# Patient Record
Sex: Female | Born: 1935 | Race: Black or African American | Hispanic: No | Smoking: Never smoker
Health system: Southern US, Community
[De-identification: ages and names within clinical notes are randomized; demographics above are authoritative.]

## PROBLEM LIST (undated history)

## (undated) DIAGNOSIS — Z95810 Presence of automatic (implantable) cardiac defibrillator: Secondary | ICD-10-CM

## (undated) DIAGNOSIS — I10 Essential (primary) hypertension: Secondary | ICD-10-CM

## (undated) DIAGNOSIS — N179 Acute kidney failure, unspecified: Secondary | ICD-10-CM

## (undated) DIAGNOSIS — E785 Hyperlipidemia, unspecified: Secondary | ICD-10-CM

## (undated) DIAGNOSIS — D126 Benign neoplasm of colon, unspecified: Secondary | ICD-10-CM

## (undated) DIAGNOSIS — N183 Chronic kidney disease, stage 3 unspecified: Secondary | ICD-10-CM

## (undated) DIAGNOSIS — I2510 Atherosclerotic heart disease of native coronary artery without angina pectoris: Secondary | ICD-10-CM

## (undated) DIAGNOSIS — I428 Other cardiomyopathies: Secondary | ICD-10-CM

## (undated) DIAGNOSIS — I509 Heart failure, unspecified: Secondary | ICD-10-CM

## (undated) DIAGNOSIS — E119 Type 2 diabetes mellitus without complications: Secondary | ICD-10-CM

## (undated) DIAGNOSIS — J381 Polyp of vocal cord and larynx: Secondary | ICD-10-CM

## (undated) DIAGNOSIS — D649 Anemia, unspecified: Secondary | ICD-10-CM

## (undated) DIAGNOSIS — M8580 Other specified disorders of bone density and structure, unspecified site: Secondary | ICD-10-CM

## (undated) HISTORY — DX: Other cardiomyopathies: I42.8

## (undated) HISTORY — DX: Hyperlipidemia, unspecified: E78.5

## (undated) HISTORY — PX: COLONOSCOPY: SHX174

## (undated) HISTORY — DX: Other specified disorders of bone density and structure, unspecified site: M85.80

## (undated) HISTORY — DX: Heart failure, unspecified: I50.9

## (undated) HISTORY — PX: VAGINAL HYSTERECTOMY: SUR661

## (undated) HISTORY — DX: Benign neoplasm of colon, unspecified: D12.6

## (undated) HISTORY — DX: Essential (primary) hypertension: I10

## (undated) HISTORY — PX: CARDIAC CATHETERIZATION: SHX172

## (undated) HISTORY — DX: Polyp of vocal cord and larynx: J38.1

## (undated) HISTORY — DX: Atherosclerotic heart disease of native coronary artery without angina pectoris: I25.10

---

## 1978-11-14 HISTORY — PX: BREAST CYST EXCISION: SHX579

## 1998-04-14 ENCOUNTER — Encounter: Admission: RE | Admit: 1998-04-14 | Discharge: 1998-07-13 | Payer: Self-pay | Admitting: Internal Medicine

## 1999-06-04 ENCOUNTER — Ambulatory Visit (HOSPITAL_BASED_OUTPATIENT_CLINIC_OR_DEPARTMENT_OTHER): Admission: RE | Admit: 1999-06-04 | Discharge: 1999-06-04 | Payer: Self-pay | Admitting: Orthopedic Surgery

## 2002-05-21 ENCOUNTER — Encounter: Payer: Self-pay | Admitting: Cardiovascular Disease

## 2002-05-21 ENCOUNTER — Inpatient Hospital Stay (HOSPITAL_COMMUNITY): Admission: AD | Admit: 2002-05-21 | Discharge: 2002-05-26 | Payer: Self-pay | Admitting: Cardiovascular Disease

## 2002-05-21 ENCOUNTER — Encounter: Payer: Self-pay | Admitting: Surgery

## 2002-05-21 HISTORY — PX: CARDIAC DEFIBRILLATOR PLACEMENT: SHX171

## 2002-05-22 ENCOUNTER — Encounter: Payer: Self-pay | Admitting: Surgery

## 2002-05-23 ENCOUNTER — Encounter (INDEPENDENT_AMBULATORY_CARE_PROVIDER_SITE_OTHER): Payer: Self-pay | Admitting: *Deleted

## 2002-05-23 ENCOUNTER — Encounter: Payer: Self-pay | Admitting: Cardiovascular Disease

## 2002-05-24 ENCOUNTER — Encounter: Payer: Self-pay | Admitting: Cardiovascular Disease

## 2002-05-25 ENCOUNTER — Encounter: Payer: Self-pay | Admitting: Surgery

## 2002-06-19 ENCOUNTER — Ambulatory Visit (HOSPITAL_COMMUNITY): Admission: RE | Admit: 2002-06-19 | Discharge: 2002-06-19 | Payer: Self-pay | Admitting: Cardiovascular Disease

## 2002-06-19 ENCOUNTER — Encounter: Payer: Self-pay | Admitting: Cardiovascular Disease

## 2002-08-14 HISTORY — PX: MICROLARYNGOSCOPY WITH CO2 LASER AND EXCISION OF VOCAL CORD LESION: SHX5970

## 2002-09-04 ENCOUNTER — Ambulatory Visit (HOSPITAL_COMMUNITY): Admission: RE | Admit: 2002-09-04 | Discharge: 2002-09-05 | Payer: Self-pay | Admitting: Otolaryngology

## 2002-09-04 ENCOUNTER — Encounter (INDEPENDENT_AMBULATORY_CARE_PROVIDER_SITE_OTHER): Payer: Self-pay | Admitting: *Deleted

## 2002-11-14 DIAGNOSIS — D126 Benign neoplasm of colon, unspecified: Secondary | ICD-10-CM

## 2002-11-14 HISTORY — DX: Benign neoplasm of colon, unspecified: D12.6

## 2005-03-07 ENCOUNTER — Ambulatory Visit: Payer: Self-pay | Admitting: Internal Medicine

## 2005-06-13 ENCOUNTER — Ambulatory Visit (HOSPITAL_COMMUNITY): Admission: RE | Admit: 2005-06-13 | Discharge: 2005-06-13 | Payer: Self-pay | Admitting: Cardiovascular Disease

## 2005-07-12 ENCOUNTER — Ambulatory Visit: Payer: Self-pay | Admitting: Internal Medicine

## 2005-07-25 ENCOUNTER — Ambulatory Visit (HOSPITAL_COMMUNITY): Admission: RE | Admit: 2005-07-25 | Discharge: 2005-07-25 | Payer: Self-pay | Admitting: Cardiovascular Disease

## 2005-07-28 HISTORY — PX: CARDIAC CATHETERIZATION: SHX172

## 2005-08-22 ENCOUNTER — Ambulatory Visit: Payer: Self-pay | Admitting: Internal Medicine

## 2005-08-22 ENCOUNTER — Ambulatory Visit (HOSPITAL_COMMUNITY): Admission: RE | Admit: 2005-08-22 | Discharge: 2005-08-22 | Payer: Self-pay | Admitting: Internal Medicine

## 2005-10-27 ENCOUNTER — Ambulatory Visit: Payer: Self-pay | Admitting: Internal Medicine

## 2005-11-10 ENCOUNTER — Ambulatory Visit: Payer: Self-pay | Admitting: Family Medicine

## 2005-12-05 ENCOUNTER — Ambulatory Visit: Payer: Self-pay | Admitting: Internal Medicine

## 2006-02-10 ENCOUNTER — Ambulatory Visit: Payer: Self-pay | Admitting: Gastroenterology

## 2006-03-31 ENCOUNTER — Ambulatory Visit: Payer: Self-pay | Admitting: Internal Medicine

## 2006-04-19 ENCOUNTER — Ambulatory Visit (HOSPITAL_COMMUNITY): Admission: RE | Admit: 2006-04-19 | Discharge: 2006-04-19 | Payer: Self-pay | Admitting: Cardiovascular Disease

## 2006-06-21 ENCOUNTER — Ambulatory Visit: Payer: Self-pay | Admitting: Internal Medicine

## 2006-07-31 ENCOUNTER — Ambulatory Visit: Payer: Self-pay | Admitting: Gastroenterology

## 2006-08-10 ENCOUNTER — Ambulatory Visit: Payer: Self-pay | Admitting: Internal Medicine

## 2006-08-30 ENCOUNTER — Ambulatory Visit: Payer: Self-pay | Admitting: Internal Medicine

## 2006-09-01 ENCOUNTER — Encounter (INDEPENDENT_AMBULATORY_CARE_PROVIDER_SITE_OTHER): Payer: Self-pay | Admitting: *Deleted

## 2006-09-01 ENCOUNTER — Ambulatory Visit: Payer: Self-pay | Admitting: Gastroenterology

## 2006-09-01 LAB — HM COLONOSCOPY: HM Colonoscopy: ABNORMAL

## 2007-02-26 ENCOUNTER — Ambulatory Visit: Payer: Self-pay | Admitting: Internal Medicine

## 2007-04-05 ENCOUNTER — Ambulatory Visit: Payer: Self-pay | Admitting: Internal Medicine

## 2007-04-05 LAB — CONVERTED CEMR LAB
BUN: 16 mg/dL (ref 6–23)
CO2: 32 meq/L (ref 19–32)
Calcium: 9.6 mg/dL (ref 8.4–10.5)
Chloride: 106 meq/L (ref 96–112)
Cholesterol: 131 mg/dL (ref 0–200)
Creatinine, Ser: 0.9 mg/dL (ref 0.4–1.2)
GFR calc Af Amer: 79 mL/min
GFR calc non Af Amer: 66 mL/min
Glucose, Bld: 117 mg/dL — ABNORMAL HIGH (ref 70–99)
HDL: 39.5 mg/dL (ref >39.0)
Hgb A1c MFr Bld: 7.6 % — ABNORMAL HIGH (ref 4.6–6.0)
LDL Cholesterol: 75 mg/dL (ref 0–99)
Potassium: 4.5 meq/L (ref 3.5–5.1)
Sodium: 144 meq/L (ref 135–145)
Total CHOL/HDL Ratio: 3.3
Triglycerides: 85 mg/dL (ref 0–149)
VLDL: 17 mg/dL (ref 0–40)

## 2007-06-07 ENCOUNTER — Ambulatory Visit: Payer: Self-pay | Admitting: Internal Medicine

## 2007-06-07 DIAGNOSIS — Z860100 Personal history of colon polyps, unspecified: Secondary | ICD-10-CM | POA: Insufficient documentation

## 2007-06-07 DIAGNOSIS — E785 Hyperlipidemia, unspecified: Secondary | ICD-10-CM | POA: Insufficient documentation

## 2007-06-07 DIAGNOSIS — I5022 Chronic systolic (congestive) heart failure: Secondary | ICD-10-CM

## 2007-06-07 DIAGNOSIS — Z853 Personal history of malignant neoplasm of breast: Secondary | ICD-10-CM

## 2007-06-07 DIAGNOSIS — M810 Age-related osteoporosis without current pathological fracture: Secondary | ICD-10-CM | POA: Insufficient documentation

## 2007-06-07 DIAGNOSIS — I10 Essential (primary) hypertension: Secondary | ICD-10-CM

## 2007-06-07 DIAGNOSIS — E119 Type 2 diabetes mellitus without complications: Secondary | ICD-10-CM | POA: Insufficient documentation

## 2007-06-07 LAB — CONVERTED CEMR LAB
BUN: 17 mg/dL (ref 6–23)
CO2: 32 meq/L (ref 19–32)
Calcium: 9.7 mg/dL (ref 8.4–10.5)
Chloride: 106 meq/L (ref 96–112)
Cholesterol: 128 mg/dL (ref 0–200)
Creatinine, Ser: 1 mg/dL (ref 0.4–1.2)
GFR calc Af Amer: 70 mL/min
GFR calc non Af Amer: 58 mL/min
Glucose, Bld: 118 mg/dL — ABNORMAL HIGH (ref 70–99)
HDL: 38.3 mg/dL — ABNORMAL LOW (ref >39.0)
Hgb A1c MFr Bld: 6.6 % — ABNORMAL HIGH (ref 4.6–6.0)
LDL Cholesterol: 76 mg/dL (ref 0–99)
Potassium: 4.5 meq/L (ref 3.5–5.1)
Sodium: 143 meq/L (ref 135–145)
Total CHOL/HDL Ratio: 3.3
Triglycerides: 69 mg/dL (ref 0–149)
VLDL: 14 mg/dL (ref 0–40)

## 2007-09-26 ENCOUNTER — Ambulatory Visit: Payer: Self-pay | Admitting: Internal Medicine

## 2007-09-26 ENCOUNTER — Telehealth (INDEPENDENT_AMBULATORY_CARE_PROVIDER_SITE_OTHER): Payer: Self-pay | Admitting: *Deleted

## 2007-09-26 DIAGNOSIS — K5730 Diverticulosis of large intestine without perforation or abscess without bleeding: Secondary | ICD-10-CM | POA: Insufficient documentation

## 2007-09-26 DIAGNOSIS — J309 Allergic rhinitis, unspecified: Secondary | ICD-10-CM

## 2007-09-26 DIAGNOSIS — N390 Urinary tract infection, site not specified: Secondary | ICD-10-CM

## 2007-09-26 DIAGNOSIS — Z86718 Personal history of other venous thrombosis and embolism: Secondary | ICD-10-CM | POA: Insufficient documentation

## 2007-09-26 DIAGNOSIS — I2510 Atherosclerotic heart disease of native coronary artery without angina pectoris: Secondary | ICD-10-CM | POA: Insufficient documentation

## 2007-09-26 LAB — CONVERTED CEMR LAB
Bilirubin Urine: NEGATIVE
Glucose, Urine, Semiquant: NEGATIVE
Ketones, urine, test strip: NEGATIVE
Nitrite: POSITIVE
Protein, U semiquant: NEGATIVE
Specific Gravity, Urine: 1.01
Urobilinogen, UA: NEGATIVE
pH: 5.5

## 2007-09-27 ENCOUNTER — Encounter: Payer: Self-pay | Admitting: Internal Medicine

## 2007-09-28 ENCOUNTER — Encounter: Payer: Self-pay | Admitting: Internal Medicine

## 2007-10-02 ENCOUNTER — Encounter: Payer: Self-pay | Admitting: Internal Medicine

## 2007-11-12 ENCOUNTER — Encounter: Payer: Self-pay | Admitting: Internal Medicine

## 2007-12-06 ENCOUNTER — Ambulatory Visit: Payer: Self-pay | Admitting: Family Medicine

## 2007-12-06 ENCOUNTER — Encounter: Payer: Self-pay | Admitting: Internal Medicine

## 2008-01-24 ENCOUNTER — Encounter: Payer: Self-pay | Admitting: Internal Medicine

## 2008-04-21 ENCOUNTER — Ambulatory Visit: Payer: Self-pay | Admitting: Internal Medicine

## 2008-04-21 DIAGNOSIS — R5381 Other malaise: Secondary | ICD-10-CM

## 2008-04-21 DIAGNOSIS — R350 Frequency of micturition: Secondary | ICD-10-CM

## 2008-04-21 DIAGNOSIS — R5383 Other fatigue: Secondary | ICD-10-CM

## 2008-04-21 DIAGNOSIS — H6120 Impacted cerumen, unspecified ear: Secondary | ICD-10-CM

## 2008-04-22 LAB — CONVERTED CEMR LAB
ALT: 16 units/L (ref 0–35)
AST: 22 units/L (ref 0–37)
Albumin: 4.2 g/dL (ref 3.5–5.2)
Alkaline Phosphatase: 40 units/L (ref 39–117)
BUN: 26 mg/dL — ABNORMAL HIGH (ref 6–23)
Basophils Absolute: 0.2 10*3/uL — ABNORMAL HIGH (ref 0.0–0.1)
Basophils Relative: 2.5 % — ABNORMAL HIGH (ref 0.0–1.0)
Bilirubin Urine: NEGATIVE
Bilirubin, Direct: 0.1 mg/dL (ref 0.0–0.3)
CO2: 33 meq/L — ABNORMAL HIGH (ref 19–32)
Calcium: 10 mg/dL (ref 8.4–10.5)
Chloride: 103 meq/L (ref 96–112)
Cholesterol: 131 mg/dL (ref 0–200)
Creatinine, Ser: 1.1 mg/dL (ref 0.4–1.2)
Crystals: NEGATIVE
Eosinophils Absolute: 0.1 10*3/uL (ref 0.0–0.7)
Eosinophils Relative: 1.8 % (ref 0.0–5.0)
GFR calc Af Amer: 63 mL/min
GFR calc non Af Amer: 52 mL/min
Glucose, Bld: 101 mg/dL — ABNORMAL HIGH (ref 70–99)
HCT: 39.9 % (ref 36.0–46.0)
HDL: 37.4 mg/dL — ABNORMAL LOW (ref >39.0)
Hemoglobin: 13.5 g/dL (ref 12.0–15.0)
Hgb A1c MFr Bld: 6.7 % — ABNORMAL HIGH (ref 4.6–6.0)
Ketones, ur: NEGATIVE mg/dL
LDL Cholesterol: 78 mg/dL (ref 0–99)
Lymphocytes Relative: 16.1 % (ref 12.0–46.0)
MCHC: 33.7 g/dL (ref 30.0–36.0)
MCV: 96.5 fL (ref 78.0–100.0)
Monocytes Absolute: 0.5 10*3/uL (ref 0.1–1.0)
Monocytes Relative: 7.2 % (ref 3.0–12.0)
Mucus, UA: NEGATIVE
Neutro Abs: 5.5 10*3/uL (ref 1.4–7.7)
Neutrophils Relative %: 72.4 % (ref 43.0–77.0)
Nitrite: NEGATIVE
Platelets: 174 10*3/uL (ref 150–400)
Potassium: 4.4 meq/L (ref 3.5–5.1)
RBC: 4.14 M/uL (ref 3.87–5.11)
RDW: 12 % (ref 11.5–14.6)
Sodium: 144 meq/L (ref 135–145)
Specific Gravity, Urine: 1.025 (ref 1.000–1.03)
TSH: 1.14 microintl units/mL (ref 0.35–5.50)
Total Bilirubin: 0.6 mg/dL (ref 0.3–1.2)
Total CHOL/HDL Ratio: 3.5
Total Protein, Urine: >=300 mg/dL — AB
Total Protein: 7.4 g/dL (ref 6.0–8.3)
Triglycerides: 77 mg/dL (ref 0–149)
Urine Glucose: NEGATIVE mg/dL
Urobilinogen, UA: 0.2 (ref 0.0–1.0)
VLDL: 15 mg/dL (ref 0–40)
WBC: 7.5 10*3/uL (ref 4.5–10.5)
pH: 6 (ref 5.0–8.0)

## 2008-06-26 ENCOUNTER — Encounter: Payer: Self-pay | Admitting: Internal Medicine

## 2008-08-14 ENCOUNTER — Encounter: Payer: Self-pay | Admitting: Internal Medicine

## 2008-09-09 ENCOUNTER — Encounter: Payer: Self-pay | Admitting: Internal Medicine

## 2008-09-29 ENCOUNTER — Encounter: Payer: Self-pay | Admitting: Internal Medicine

## 2008-10-20 ENCOUNTER — Ambulatory Visit: Payer: Self-pay | Admitting: Internal Medicine

## 2008-10-20 ENCOUNTER — Encounter: Payer: Self-pay | Admitting: Internal Medicine

## 2008-10-20 LAB — CONVERTED CEMR LAB
BUN: 19 mg/dL (ref 6–23)
CO2: 32 meq/L (ref 19–32)
Calcium: 9.8 mg/dL (ref 8.4–10.5)
Chloride: 103 meq/L (ref 96–112)
Cholesterol: 115 mg/dL (ref 0–200)
Creatinine, Ser: 1 mg/dL (ref 0.4–1.2)
GFR calc Af Amer: 70 mL/min
GFR calc non Af Amer: 58 mL/min
Glucose, Bld: 112 mg/dL — ABNORMAL HIGH (ref 70–99)
HDL: 43 mg/dL (ref >39.0)
Hgb A1c MFr Bld: 7 % — ABNORMAL HIGH (ref 4.6–6.0)
LDL Cholesterol: 59 mg/dL (ref 0–99)
Potassium: 4.8 meq/L (ref 3.5–5.1)
Sodium: 141 meq/L (ref 135–145)
Total CHOL/HDL Ratio: 2.7
Triglycerides: 66 mg/dL (ref 0–149)
VLDL: 13 mg/dL (ref 0–40)

## 2008-12-16 ENCOUNTER — Telehealth: Payer: Self-pay | Admitting: Internal Medicine

## 2009-01-23 ENCOUNTER — Encounter: Payer: Self-pay | Admitting: Internal Medicine

## 2009-03-02 ENCOUNTER — Encounter: Payer: Self-pay | Admitting: Internal Medicine

## 2009-04-21 ENCOUNTER — Ambulatory Visit: Payer: Self-pay | Admitting: Internal Medicine

## 2009-06-02 ENCOUNTER — Ambulatory Visit: Payer: Self-pay | Admitting: Internal Medicine

## 2009-06-09 ENCOUNTER — Encounter: Payer: Self-pay | Admitting: Internal Medicine

## 2009-06-19 ENCOUNTER — Encounter: Payer: Self-pay | Admitting: Internal Medicine

## 2009-07-08 ENCOUNTER — Encounter: Payer: Self-pay | Admitting: Internal Medicine

## 2009-07-29 ENCOUNTER — Encounter: Payer: Self-pay | Admitting: Internal Medicine

## 2009-09-14 ENCOUNTER — Ambulatory Visit: Payer: Self-pay | Admitting: Internal Medicine

## 2009-09-16 LAB — CONVERTED CEMR LAB
BUN: 21 mg/dL (ref 6–23)
CO2: 33 meq/L — ABNORMAL HIGH (ref 19–32)
Calcium: 9.5 mg/dL (ref 8.4–10.5)
Chloride: 102 meq/L (ref 96–112)
Cholesterol: 123 mg/dL (ref 0–200)
Creatinine, Ser: 1.3 mg/dL — ABNORMAL HIGH (ref 0.4–1.2)
GFR calc non Af Amer: 51.55 mL/min (ref >60)
Glucose, Bld: 69 mg/dL — ABNORMAL LOW (ref 70–99)
HDL: 50.2 mg/dL (ref >39.00)
Hgb A1c MFr Bld: 6.2 % (ref 4.6–6.5)
LDL Cholesterol: 57 mg/dL (ref 0–99)
Potassium: 4.7 meq/L (ref 3.5–5.1)
Sodium: 141 meq/L (ref 135–145)
Total CHOL/HDL Ratio: 2
Triglycerides: 78 mg/dL (ref 0.0–149.0)
VLDL: 15.6 mg/dL (ref 0.0–40.0)

## 2009-09-30 ENCOUNTER — Encounter: Payer: Self-pay | Admitting: Internal Medicine

## 2009-10-20 ENCOUNTER — Encounter: Payer: Self-pay | Admitting: Internal Medicine

## 2009-12-18 ENCOUNTER — Ambulatory Visit: Payer: Self-pay | Admitting: Internal Medicine

## 2009-12-18 DIAGNOSIS — G4700 Insomnia, unspecified: Secondary | ICD-10-CM | POA: Insufficient documentation

## 2009-12-18 DIAGNOSIS — F329 Major depressive disorder, single episode, unspecified: Secondary | ICD-10-CM

## 2009-12-18 DIAGNOSIS — F411 Generalized anxiety disorder: Secondary | ICD-10-CM

## 2009-12-18 DIAGNOSIS — F3289 Other specified depressive episodes: Secondary | ICD-10-CM | POA: Insufficient documentation

## 2009-12-18 LAB — CONVERTED CEMR LAB: Hgb A1c MFr Bld: 6.8 % — ABNORMAL HIGH (ref 4.6–6.5)

## 2010-01-28 ENCOUNTER — Ambulatory Visit: Payer: Self-pay | Admitting: Family Medicine

## 2010-01-28 ENCOUNTER — Encounter: Payer: Self-pay | Admitting: Internal Medicine

## 2010-02-15 ENCOUNTER — Encounter: Payer: Self-pay | Admitting: Internal Medicine

## 2010-03-15 ENCOUNTER — Encounter: Payer: Self-pay | Admitting: Internal Medicine

## 2010-03-19 ENCOUNTER — Ambulatory Visit: Payer: Self-pay | Admitting: Internal Medicine

## 2010-03-19 DIAGNOSIS — M899 Disorder of bone, unspecified: Secondary | ICD-10-CM | POA: Insufficient documentation

## 2010-03-19 DIAGNOSIS — M949 Disorder of cartilage, unspecified: Secondary | ICD-10-CM

## 2010-03-19 LAB — CONVERTED CEMR LAB
BUN: 23 mg/dL (ref 6–23)
CO2: 35 meq/L — ABNORMAL HIGH (ref 19–32)
Calcium: 10.1 mg/dL (ref 8.4–10.5)
Chloride: 97 meq/L (ref 96–112)
Cholesterol: 125 mg/dL (ref 0–200)
Creatinine, Ser: 1.2 mg/dL (ref 0.4–1.2)
GFR calc non Af Amer: 59.3 mL/min (ref >60)
Glucose, Bld: 130 mg/dL — ABNORMAL HIGH (ref 70–99)
HDL: 47.7 mg/dL (ref >39.00)
Hgb A1c MFr Bld: 6.8 % — ABNORMAL HIGH (ref 4.6–6.5)
LDL Cholesterol: 64 mg/dL (ref 0–99)
Potassium: 4.7 meq/L (ref 3.5–5.1)
Sodium: 139 meq/L (ref 135–145)
Total CHOL/HDL Ratio: 3
Triglycerides: 69 mg/dL (ref 0.0–149.0)
VLDL: 13.8 mg/dL (ref 0.0–40.0)

## 2010-06-09 ENCOUNTER — Encounter: Payer: Self-pay | Admitting: Internal Medicine

## 2010-06-29 ENCOUNTER — Encounter: Payer: Self-pay | Admitting: Internal Medicine

## 2010-06-30 ENCOUNTER — Encounter: Payer: Self-pay | Admitting: Internal Medicine

## 2010-09-01 HISTORY — PX: IMPLANTABLE CARDIOVERTER DEFIBRILLATOR GENERATOR CHANGE: SHX5859

## 2010-09-02 ENCOUNTER — Encounter: Payer: Self-pay | Admitting: Internal Medicine

## 2010-09-07 ENCOUNTER — Encounter: Payer: Self-pay | Admitting: Internal Medicine

## 2010-09-20 ENCOUNTER — Ambulatory Visit: Payer: Self-pay | Admitting: Internal Medicine

## 2010-09-20 DIAGNOSIS — D649 Anemia, unspecified: Secondary | ICD-10-CM

## 2010-09-28 ENCOUNTER — Encounter: Payer: Self-pay | Admitting: Internal Medicine

## 2010-10-18 ENCOUNTER — Encounter: Payer: Self-pay | Admitting: Internal Medicine

## 2010-10-19 ENCOUNTER — Encounter: Payer: Self-pay | Admitting: Internal Medicine

## 2010-10-22 ENCOUNTER — Telehealth: Payer: Self-pay | Admitting: Internal Medicine

## 2010-11-22 ENCOUNTER — Ambulatory Visit
Admission: RE | Admit: 2010-11-22 | Discharge: 2010-11-22 | Payer: Self-pay | Source: Home / Self Care | Attending: Internal Medicine | Admitting: Internal Medicine

## 2010-11-22 DIAGNOSIS — J019 Acute sinusitis, unspecified: Secondary | ICD-10-CM | POA: Insufficient documentation

## 2010-12-16 NOTE — Letter (Signed)
Summary: [facility]   Imported By: Lennie Odor 09/15/2010 09:36:59  _____________________________________________________________________  External Attachment:    Type:   Image     Comment:   External Document

## 2010-12-16 NOTE — Letter (Signed)
Summary: [facility]   Imported By: Sherian Rein 11/01/2010 11:41:14  _____________________________________________________________________  External Attachment:    Type:   Image     Comment:   External Document

## 2010-12-16 NOTE — Medication Information (Signed)
Summary: [facility]   Imported By: Lester Milton 10/04/2010 09:14:50  _____________________________________________________________________  External Attachment:    Type:   Image     Comment:   External Document

## 2010-12-16 NOTE — Assessment & Plan Note (Signed)
Summary: SINUS/NWS   Vital Signs:  Patient profile:   77 year old female Height:      64.5 inches Weight:      113.25 pounds BMI:     19.21 O2 Sat:      98 % on Room air Temp:     97.9 degrees F oral Pulse rate:   80 / minute BP sitting:   100 / 66  (left arm) Cuff size:   regular  Vitals Entered By: Zella Ball Ewing CMA Duncan Dull) (November 22, 2010 11:47 AM)  O2 Flow:  Room air CC: Sinus Congestion, Refills/RE   CC:  Sinus Congestion and Refills/RE.  History of Present Illness: here with acute onset 3 days facial pain, pressure, fever and greenish d/c with right earache  as well but no hearing loss, headache, n/v or dizziness.  Pt denies CP, worsening sob, doe, wheezing, orthopnea, pnd, worsening LE edema, palps, dizziness or syncope  Pt denies new neuro symptoms such as headache, facial or extremity weakness  Pt denies polydipsia, polyuria, or low sugar symptoms such as shakiness improved with eating.   No recent wt loss, night sweats, loss of appetite or other constitutional symptoms   Due to f/u 1 wk from today with cardiologist in [place] regarding her aortic valve per pt.  Overall good compliance with meds, and good tolerability. Denies worsening depressive symptoms, suicidal ideation, or panic.    Preventive Screening-Counseling & Management      Drug Use:  no.    Problems Prior to Update: 1)  Sinusitis- Acute-nos  (ICD-461.9) 2)  Anemia-nos  (ICD-285.9) 3)  Osteopenia  (ICD-733.90) 4)  Preventive Health Care  (ICD-V70.0) 5)  Insomnia-sleep Disorder-unspec  (ICD-780.52) 6)  Depression  (ICD-311) 7)  Anxiety  (ICD-300.00) 8)  Cerumen Impaction, Bilateral  (ICD-380.4) 9)  Fatigue  (ICD-780.79) 10)  Urinary Frequency  (ICD-788.41) 11)  Allergic Rhinitis  (ICD-477.9) 12)  Coronary Artery Disease  (ICD-414.00) 13)  Diverticulosis, Colon  (ICD-562.10) 14)  Dvt, Hx of  (ICD-V12.51) 15)  Uti  (ICD-599.0) 16)  Family History Diabetes 1st Degree Relative  (ICD-V18.0) 17)  Breast  Cancer, Hx of  (ICD-V10.3) 18)  Osteoporosis  (ICD-733.00) 19)  Colonic Polyps, Hx of  (ICD-V12.72) 20)  Congestive Heart Failure  (ICD-428.0) 21)  Hypertension  (ICD-401.9) 22)  Hyperlipidemia  (ICD-272.4) 23)  Diabetes Mellitus, Type II  (ICD-250.00)  Medications Prior to Update: 1)  Boniva 150 Mg  Tabs (Ibandronate Sodium) .Marland Kitchen.. 1 By Mouth Q Month 2)  Januvia 100 Mg  Tabs (Sitagliptin Phosphate) .Marland Kitchen.. 1 By Mouth Once Daily 3)  Furosemide 40 Mg  Tabs (Furosemide) .... Take 1 By Mouth Every Other Day, Alternating With 1and 1/2 By Mouth Every Other Day 4)  Lisinopril 10 Mg  Tabs (Lisinopril) .... 1/2 By Mouth Once Daily 5)  Ecotrin Low Strength 81 Mg  Tbec (Aspirin) .Marland Kitchen.. 1po Qd 6)  Lanoxin 0.125 Mg  Tabs (Digoxin) .Marland Kitchen.. 1 By Mouth Qd 7)  Carvedilol 12.5 Mg  Tabs (Carvedilol) .Marland Kitchen.. 1 By Mouth Bid 8)  Simvastatin 20 Mg  Tabs (Simvastatin) .Marland Kitchen.. 1 By Mouth Qhs 9)  Spironolactone 25 Mg  Tabs (Spironolactone) .... 1/2  By Mouth Once Daily 10)  Diazepam 10 Mg  Tabs (Diazepam) .... 1/2 To 1 By Mouth At Bedtime Prn 11)  Metformin Hcl 500 Mg Tabs (Metformin Hcl) .Marland Kitchen.. 1 By Mouth Two Times A Day 12)  Klor-Con M20 20 Meq Cr-Tabs (Potassium Chloride Crys Cr) .Marland Kitchen.. 1po Once Daily 13)  Benefiber  Powd (Wheat Dextrin) .... 2 Tablespoons Once Daily 14)  Citracal/vitamin D 250-200 Mg-Unit Tabs (Calcium Citrate-Vitamin D) .Marland Kitchen.. 1 By Mouth Once Daily 15)  Vitamin C 500 Mg Tabs (Ascorbic Acid) .Marland Kitchen.. 1 By Mouth Once Daily 16)  Accu-Chek Aviva  Strp (Glucose Blood) .... Use Asd Two Times A Day  - Code 250.02 17)  Losartan Potassium 50 Mg Tabs (Losartan Potassium) .Marland Kitchen.. 1 By Mouth Once Daily 18)  Poly-Iron 150 150 Mg Caps (Polysaccharide Iron Complex) .Marland Kitchen.. 1 By Mouth Two Times A Day  Current Medications (verified): 1)  Boniva 150 Mg  Tabs (Ibandronate Sodium) .Marland Kitchen.. 1 By Mouth Q Month 2)  Januvia 100 Mg  Tabs (Sitagliptin Phosphate) .Marland Kitchen.. 1 By Mouth Once Daily 3)  Furosemide 40 Mg  Tabs (Furosemide) .... Take 1 By Mouth  Every Other Day, Alternating With 1and 1/2 By Mouth Every Other Day 4)  Lisinopril 10 Mg  Tabs (Lisinopril) .... 1/2 By Mouth Once Daily 5)  Ecotrin Low Strength 81 Mg  Tbec (Aspirin) .Marland Kitchen.. 1po Qd 6)  Lanoxin 0.125 Mg  Tabs (Digoxin) .Marland Kitchen.. 1 By Mouth Qd 7)  Carvedilol 12.5 Mg  Tabs (Carvedilol) .Marland Kitchen.. 1 By Mouth Bid 8)  Simvastatin 20 Mg  Tabs (Simvastatin) .Marland Kitchen.. 1 By Mouth Qhs 9)  Spironolactone 25 Mg  Tabs (Spironolactone) .... 1/2  By Mouth Once Daily 10)  Diazepam 10 Mg  Tabs (Diazepam) .... 1/2 To 1 By Mouth At Bedtime Prn 11)  Metformin Hcl 500 Mg Tabs (Metformin Hcl) .... 2 By Mouth Qam 12)  Klor-Con M20 20 Meq Cr-Tabs (Potassium Chloride Crys Cr) .Marland Kitchen.. 1po Once Daily 13)  Benefiber  Powd (Wheat Dextrin) .... 2 Tablespoons Once Daily 14)  Citracal/vitamin D 250-200 Mg-Unit Tabs (Calcium Citrate-Vitamin D) .Marland Kitchen.. 1 By Mouth Once Daily 15)  Vitamin C 500 Mg Tabs (Ascorbic Acid) .Marland Kitchen.. 1 By Mouth Once Daily 16)  Accu-Chek Aviva  Strp (Glucose Blood) .... Use Asd Two Times A Day  - Code 250.02 17)  Losartan Potassium 50 Mg Tabs (Losartan Potassium) .Marland Kitchen.. 1 By Mouth Once Daily 18)  Poly-Iron 150 150 Mg Caps (Polysaccharide Iron Complex) .Marland Kitchen.. 1 By Mouth Two Times A Day 19)  Cephalexin 500 Mg Caps (Cephalexin) .Marland Kitchen.. 1 By Mouth Three Times A Day  Allergies (verified): No Known Drug Allergies  Past History:  Past Medical History: Last updated: 09/20/2010 Diabetes mellitus, type II Hyperlipidemia Hypertension Congestive heart failure Non ischemic cardiomyopathyy - ER 10-15% Colonic polyps, hx of Osteoporosis Breast cancer, hx of DVT, hx of - LUE Diverticulosis, colon Coronary artery disease - non critical Allergic rhinitis Anxiety Depression - situational Osteopenia Anemia-NOS  Past Surgical History: Last updated: 09/20/2010 Pacemaker-05/21/2002; s/p re-do pacemaker/defibrillator oct 2011 - [facility] Hysterectomy L Breast tumor Resection- 1980s  Social History: Last updated:  09/20/2010 Never Smoked Alcohol use-no Married 2 sons lives with 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  , mild ill  Head:  normocephalic and atraumatic.   Eyes:  vision grossly intact, pupils equal, and pupils round.   Ears:  right tm mild erythema, left TM ok,  sinus tender bilat maxillary areas Nose:  nasal dischargemucosal pallor and mucosal edema.   Mouth:  pharyngeal erythema and fair dentition.  Neck:  supple and no masses.   Lungs:  normal respiratory effort and normal breath sounds.   Heart:  normal rate and regular rhythm.   Extremities:  no edema, no erythema    Impression & Recommendations:  Problem # 1:  SINUSITIS- ACUTE-NOS (ICD-461.9)  Her updated medication list for this problem includes:    Cephalexin 500 Mg Caps (Cephalexin) .Marland Kitchen... 1 by mouth three times a day treat as above, f/u any worsening signs or symptoms   Problem # 2:  DIABETES MELLITUS, TYPE II (ICD-250.00)  Her updated medication list for this problem includes:    Januvia 100 Mg Tabs (Sitagliptin phosphate) .Marland Kitchen... 1 by mouth once daily    Lisinopril 10 Mg Tabs (Lisinopril) .Marland Kitchen... 1/2 by mouth once daily    Ecotrin Low Strength 81 Mg Tbec (Aspirin) .Marland Kitchen... 1po qd    Metformin Hcl 500 Mg Tabs (Metformin hcl) .Marland Kitchen... 2 by mouth qam    Losartan Potassium 50 Mg Tabs (Losartan potassium) .Marland Kitchen... 1 by mouth once daily  Labs Reviewed: Creat: 1.2 (03/19/2010)    Reviewed HgBA1c results: 6.8 (03/19/2010)  6.8 (12/18/2009) stable overall by hx and exam, ok to continue meds/tx as is   Problem # 3:  HYPERTENSION (ICD-401.9)  Her updated medication list for this problem includes:    Furosemide 40 Mg Tabs (Furosemide) .Marland Kitchen... Take 1 by mouth every other day,  alternating with 1and 1/2 by mouth every other day    Lisinopril 10 Mg Tabs (Lisinopril) .Marland Kitchen... 1/2 by mouth once daily    Carvedilol 12.5 Mg Tabs (Carvedilol) .Marland Kitchen... 1 by mouth bid    Spironolactone 25 Mg Tabs (Spironolactone) .Marland Kitchen... 1/2  by mouth once daily    Losartan Potassium 50 Mg Tabs (Losartan potassium) .Marland Kitchen... 1 by mouth once daily  BP today: 100/66 Prior BP: 102/60 (09/20/2010)  Labs Reviewed: K+: 4.7 (03/19/2010) Creat: : 1.2 (03/19/2010)   Chol: 125 (03/19/2010)   HDL: 47.70 (03/19/2010)   LDL: 64 (03/19/2010)   TG: 69.0 (03/19/2010) stable overall by hx and exam, ok to continue meds/tx as is   Problem # 4:  DEPRESSION (ICD-311)  Her updated medication list for this problem includes:    Diazepam 10 Mg Tabs (Diazepam) .Marland Kitchen... 1/2 to 1 by mouth at bedtime prn stable overall by hx and exam, ok to continue meds/tx as is   Complete Medication List: 1)  Boniva 150 Mg Tabs (Ibandronate sodium) .Marland Kitchen.. 1 by mouth q month 2)  Januvia 100 Mg Tabs (Sitagliptin phosphate) .Marland Kitchen.. 1 by mouth once daily 3)  Furosemide 40 Mg Tabs (Furosemide) .... Take 1 by mouth every other day, alternating with 1and 1/2 by mouth every other day 4)  Lisinopril 10 Mg Tabs (Lisinopril) .... 1/2 by mouth once daily 5)  Ecotrin Low Strength 81 Mg Tbec (Aspirin) .Marland Kitchen.. 1po qd 6)  Lanoxin 0.125 Mg Tabs (Digoxin) .Marland Kitchen.. 1 by mouth qd 7)  Carvedilol 12.5 Mg Tabs (Carvedilol) .Marland Kitchen.. 1 by mouth bid 8)  Simvastatin 20 Mg Tabs (Simvastatin) .Marland Kitchen.. 1 by mouth qhs 9)  Spironolactone 25 Mg Tabs (Spironolactone) .... 1/2  by mouth once daily 10)  Diazepam 10 Mg Tabs (Diazepam) .... 1/2 to 1 by mouth at bedtime prn 11)  Metformin Hcl 500 Mg Tabs (Metformin hcl) .... 2 by mouth qam 12)  Klor-con M20 20 Meq Cr-tabs (Potassium chloride crys cr) .Marland Kitchen.. 1po once daily 13)  Benefiber Powd (Wheat dextrin) .... 2 tablespoons once daily 14)  Citracal/vitamin D 250-200 Mg-unit Tabs (Calcium citrate-vitamin d) .Marland KitchenMarland KitchenMarland Kitchen  1 by mouth once daily 15)  Vitamin  C 500 Mg Tabs (Ascorbic acid) .Marland Kitchen.. 1 by mouth once daily 16)  Accu-chek Aviva Strp (Glucose blood) .... Use asd two times a day  - code 250.02 17)  Losartan Potassium 50 Mg Tabs (Losartan potassium) .Marland Kitchen.. 1 by mouth once daily 18)  Poly-iron 150 150 Mg Caps (Polysaccharide iron complex) .Marland Kitchen.. 1 by mouth two times a day 19)  Cephalexin 500 Mg Caps (Cephalexin) .Marland Kitchen.. 1 by mouth three times a day  Patient Instructions: 1)  Please take all new medications as prescribed' 2)  Continue all previous medications as before this visit  3)  You are given some refills today - the boniva, januvia, and metformin 4)  You can also use Mucinex OTC or it's generic for congestion , as well as the tylenol as needed  5)  Please schedule a follow-up appointment in 4 months - in May 2012 as planned Prescriptions: CEPHALEXIN 500 MG CAPS (CEPHALEXIN) 1 by mouth three times a day  #30 x 0   Entered and Authorized by:   Corwin Levins MD   Signed by:   Corwin Levins MD on 11/22/2010   Method used:   Print then Give to Patient   RxID:   [id number] BONIVA 150 MG  TABS (IBANDRONATE SODIUM) 1 by mouth q month  #3 x 3   Entered and Authorized by:   Corwin Levins MD   Signed by:   Corwin Levins MD on 11/22/2010   Method used:   Print then Give to Patient   RxID:   [id number] JANUVIA 100 MG  TABS (SITAGLIPTIN PHOSPHATE) 1 by mouth once daily  #90 x 3   Entered and Authorized by:   Corwin Levins MD   Signed by:   Corwin Levins MD on 11/22/2010   Method used:   Print then Give to Patient   RxID:   [id number] JANUVIA 100 MG  TABS (SITAGLIPTIN PHOSPHATE) 1 by mouth once daily  #30 x 0   Entered and Authorized by:   Corwin Levins MD   Signed by:   Corwin Levins MD on 11/22/2010   Method used:   Print then Give to Patient   RxID:   [id number] METFORMIN HCL 500 MG TABS (METFORMIN HCL) 2 by mouth qam  #180 x 3   Entered and Authorized by:   Corwin Levins MD   Signed by:   Corwin Levins MD on 11/22/2010   Method  used:   Print then Give to Patient   RxID:   [id number] METFORMIN HCL 500 MG TABS (METFORMIN HCL) 2 by mouth qam  #60 x 0   Entered and Authorized by:   Corwin Levins MD   Signed by:   Corwin Levins MD on 11/22/2010   Method used:   Print then Give to Patient   RxID:   [id number]    Orders Added: 1)  Est. Patient Level IV [62376]

## 2010-12-16 NOTE — Assessment & Plan Note (Signed)
Summary: 3 MTH FU--STC   Vital Signs:  Patient profile:   77 year old female Height:      64.5 inches Weight:      108.75 pounds BMI:     18.45 O2 Sat:      97 % on Room air Temp:     97.7 degrees F oral Pulse rate:   73 / minute BP sitting:   102 / 62  (left arm) Cuff size:   regular  Vitals Entered ByZella Ball Ewing (Mar 19, 2010 10:16 AM)  O2 Flow:  Room air  Preventive Care Screening  Bone Density:    Date:  01/28/2010    Next Due:  02/2012    Results:  abnormal std dev  CC: 3 month followup/RE   CC:  3 month followup/RE.  History of Present Illness: husband died with Mult Myeloma in 04-Jan-2010;  Pt denies CP, sob, doe, wheezing, orthopnea, pnd, worsening LE edema, palps, dizziness or syncope   Pt denies new neuro symptoms such as headache, facial or extremity weakness   Pt denies polydipsia, polyuria, or low sugar symptoms such as shakiness improved with eating.  Overall good compliance with meds, trying to follow low chol, DM diet, wt stable, little excercise however   CBG's in low 100's.  Has had some increased stress, though mild and she never took the paxil, but wants to at this time.  No suicidal ideaiton or panic.    Problems Prior to Update: 1)  Osteopenia  (ICD-733.90) 2)  Preventive Health Care  (ICD-V70.0) 3)  Insomnia-sleep Disorder-unspec  (ICD-780.52) 4)  Depression  (ICD-311) 5)  Anxiety  (ICD-300.00) 6)  Cerumen Impaction, Bilateral  (ICD-380.4) 7)  Fatigue  (ICD-780.79) 8)  Urinary Frequency  (ICD-788.41) 9)  Allergic Rhinitis  (ICD-477.9) 10)  Coronary Artery Disease  (ICD-414.00) 11)  Diverticulosis, Colon  (ICD-562.10) 12)  Dvt, Hx of  (ICD-V12.51) 13)  Uti  (ICD-599.0) 14)  Family History Diabetes 1st Degree Relative  (ICD-V18.0) 15)  Breast Cancer, Hx of  (ICD-V10.3) 16)  Osteoporosis  (ICD-733.00) 17)  Colonic Polyps, Hx of  (ICD-V12.72) 18)  Congestive Heart Failure  (ICD-428.0) 19)  Hypertension  (ICD-401.9) 20)  Hyperlipidemia   (ICD-272.4) 21)  Diabetes Mellitus, Type II  (ICD-250.00)  Medications Prior to Update: 1)  Boniva 150 Mg  Tabs (Ibandronate Sodium) .Marland Kitchen.. 1 By Mouth Q Month 2)  Januvia 100 Mg  Tabs (Sitagliptin Phosphate) .Marland Kitchen.. 1 By Mouth Once Daily 3)  Furosemide 40 Mg  Tabs (Furosemide) .Marland Kitchen.. 1 and 1/2 By Mouth Qam 4)  Lisinopril 10 Mg  Tabs (Lisinopril) .... 1/2 By Mouth Once Daily 5)  Ecotrin Low Strength 81 Mg  Tbec (Aspirin) .Marland Kitchen.. 1po Qd 6)  Lanoxin 0.125 Mg  Tabs (Digoxin) .Marland Kitchen.. 1 By Mouth Qd 7)  Carvedilol 12.5 Mg  Tabs (Carvedilol) .Marland Kitchen.. 1 By Mouth Bid 8)  Simvastatin 20 Mg  Tabs (Simvastatin) .Marland Kitchen.. 1 By Mouth Qhs 9)  Spironolactone 25 Mg  Tabs (Spironolactone) .... 1/2  By Mouth Once Daily 10)  Diazepam 10 Mg  Tabs (Diazepam) .... 1/2 To 1 By Mouth At Bedtime Prn 11)  Metformin Hcl 500 Mg Tabs (Metformin Hcl) .Marland Kitchen.. 1 By Mouth Once Daily 12)  Klor-Con M20 20 Meq Cr-Tabs (Potassium Chloride Crys Cr) .Marland Kitchen.. 1po Once Daily 13)  Benefiber  Powd (Wheat Dextrin) .... 2 Tablespoons Once Daily 14)  Citracal/vitamin D 250-200 Mg-Unit Tabs (Calcium Citrate-Vitamin D) .Marland Kitchen.. 1 By Mouth Once Daily 15)  Vitamin C  500 Mg Tabs (Ascorbic Acid) .Marland Kitchen.. 1 By Mouth Once Daily 16)  Accu-Chek Aviva  Strp (Glucose Blood) .... Use Asd Two Times A Day  - Code 250.02 17)  Paroxetine Hcl 10 Mg Tabs (Paroxetine Hcl) .Marland Kitchen.. 1 By Mouth Once Daily 18)  Temazepam 15 Mg Caps (Temazepam) .Marland Kitchen.. 1 - 2 By Mouth At Bedtime As Needed  Current Medications (verified): 1)  Boniva 150 Mg  Tabs (Ibandronate Sodium) .Marland Kitchen.. 1 By Mouth Q Month 2)  Januvia 100 Mg  Tabs (Sitagliptin Phosphate) .Marland Kitchen.. 1 By Mouth Once Daily 3)  Furosemide 40 Mg  Tabs (Furosemide) .... Take 1 By Mouth Every Other Day, Alternating With 1and 1/2 By Mouth Every Other Day 4)  Lisinopril 10 Mg  Tabs (Lisinopril) .... 1/2 By Mouth Once Daily 5)  Ecotrin Low Strength 81 Mg  Tbec (Aspirin) .Marland Kitchen.. 1po Qd 6)  Lanoxin 0.125 Mg  Tabs (Digoxin) .Marland Kitchen.. 1 By Mouth Qd 7)  Carvedilol 12.5 Mg  Tabs  (Carvedilol) .Marland Kitchen.. 1 By Mouth Bid 8)  Simvastatin 20 Mg  Tabs (Simvastatin) .Marland Kitchen.. 1 By Mouth Qhs 9)  Spironolactone 25 Mg  Tabs (Spironolactone) .... 1/2  By Mouth Once Daily 10)  Diazepam 10 Mg  Tabs (Diazepam) .... 1/2 To 1 By Mouth At Bedtime Prn 11)  Metformin Hcl 500 Mg Tabs (Metformin Hcl) .Marland Kitchen.. 1 By Mouth Once Daily 12)  Klor-Con M20 20 Meq Cr-Tabs (Potassium Chloride Crys Cr) .Marland Kitchen.. 1po Once Daily 13)  Benefiber  Powd (Wheat Dextrin) .... 2 Tablespoons Once Daily 14)  Citracal/vitamin D 250-200 Mg-Unit Tabs (Calcium Citrate-Vitamin D) .Marland Kitchen.. 1 By Mouth Once Daily 15)  Vitamin C 500 Mg Tabs (Ascorbic Acid) .Marland Kitchen.. 1 By Mouth Once Daily 16)  Accu-Chek Aviva  Strp (Glucose Blood) .... Use Asd Two Times A Day  - Code 250.02 17)  Paroxetine Hcl 10 Mg Tabs (Paroxetine Hcl) .Marland Kitchen.. 1po Once Daily  Allergies (verified): No Known Drug Allergies  Past History:  Past Surgical History: Last updated: 09/26/2007 Pacemaker-05/21/2002 Hysterectomy L Breast tumor Resection- 1980s  Social History: Last updated: 09/14/2009 Never Smoked Alcohol use-no Married 2 sons lives with husband retired - Counsellor - report clerk Husband now with Multiple Myeloma - sees dr neijstrom/[place]  Risk Factors: Smoking Status: never (09/26/2007)  Past Medical History: Diabetes mellitus, type II Hyperlipidemia Hypertension Congestive heart failure Colonic polyps, hx of Osteoporosis Breast cancer, hx of DVT, hx of - LUE Diverticulosis, colon Coronary artery disease - non critical Allergic rhinitis Anxiety Depression - situational Osteopenia  Review of Systems       all otherwise negative per pt -    Physical Exam  General:  alert and well-developed.   Head:  normocephalic and atraumatic.   Eyes:  vision grossly intact, pupils equal, and pupils round.   Ears:  R ear normal and L ear normal.   Nose:  no external deformity and no nasal discharge.   Mouth:  no gingival abnormalities and pharynx  pink and moist.   Neck:  supple and no masses.   Lungs:  normal respiratory effort and normal breath sounds.   Heart:  normal rate and regular rhythm.   Extremities:  no edema, no erythema  Psych:  dysphoric affect and moderately anxious.     Impression & Recommendations:  Problem # 1:  DIABETES MELLITUS, TYPE II (ICD-250.00)  Her updated medication list for this problem includes:    Januvia 100 Mg Tabs (Sitagliptin phosphate) .Marland Kitchen... 1 by mouth once daily    Lisinopril  10 Mg Tabs (Lisinopril) .Marland Kitchen... 1/2 by mouth once daily    Ecotrin Low Strength 81 Mg Tbec (Aspirin) .Marland Kitchen... 1po qd    Metformin Hcl 500 Mg Tabs (Metformin hcl) .Marland Kitchen... 1 by mouth once daily  Labs Reviewed: Creat: 1.3 (09/14/2009)    Reviewed HgBA1c results: 6.8 (12/18/2009)  6.2 (09/14/2009) stable overall by hx and exam, ok to continue meds/tx as is ;  Pt to cont DM diet, excercise, wt loss efforts; to check labs today   Orders: TLB-BMP (Basic Metabolic Panel-BMET) (80048-METABOL) TLB-A1C / Hgb A1C (Glycohemoglobin) (83036-A1C) TLB-Lipid Panel (80061-LIPID)  Problem # 2:  HYPERLIPIDEMIA (ICD-272.4)  Her updated medication list for this problem includes:    Simvastatin 20 Mg Tabs (Simvastatin) .Marland Kitchen... 1 by mouth qhs  Labs Reviewed: SGOT: 22 (04/21/2008)   SGPT: 16 (04/21/2008)   HDL:50.20 (09/14/2009), 43.0 (10/20/2008)  LDL:57 (09/14/2009), 59 (10/20/2008)  Chol:123 (09/14/2009), 115 (10/20/2008)  Trig:78.0 (09/14/2009), 66 (10/20/2008) stable overall by hx and exam, ok to continue meds/tx as is  , Pt to continue diet efforts, good med tolerance; to check labs - goal LDL less than 70   Problem # 3:  HYPERTENSION (ICD-401.9)  Her updated medication list for this problem includes:    Furosemide 40 Mg Tabs (Furosemide) .Marland Kitchen... Take 1 by mouth every other day, alternating with 1and 1/2 by mouth every other day    Lisinopril 10 Mg Tabs (Lisinopril) .Marland Kitchen... 1/2 by mouth once daily    Carvedilol 12.5 Mg Tabs (Carvedilol)  .Marland Kitchen... 1 by mouth bid    Spironolactone 25 Mg Tabs (Spironolactone) .Marland Kitchen... 1/2  by mouth once daily  BP today: 102/62 Prior BP: 110/68 (12/18/2009)  Labs Reviewed: K+: 4.7 (09/14/2009) Creat: : 1.3 (09/14/2009)   Chol: 123 (09/14/2009)   HDL: 50.20 (09/14/2009)   LDL: 57 (09/14/2009)   TG: 78.0 (09/14/2009) stable overall by hx and exam, ok to continue meds/tx as is   Problem # 4:  DEPRESSION (ICD-311)  The following medications were removed from the medication list:    Paroxetine Hcl 10 Mg Tabs (Paroxetine hcl) .Marland Kitchen... 1 by mouth once daily Her updated medication list for this problem includes:    Diazepam 10 Mg Tabs (Diazepam) .Marland Kitchen... 1/2 to 1 by mouth at bedtime prn    Paroxetine Hcl 10 Mg Tabs (Paroxetine hcl) .Marland Kitchen... 1po once daily denies worsening  symptoms, stable overall by hx and exam, ok to continue meds/tx as is , wt improved now; but is interested in takng the paxil now to help wtih mood and wt gain  Complete Medication List: 1)  Boniva 150 Mg Tabs (Ibandronate sodium) .Marland Kitchen.. 1 by mouth q month 2)  Januvia 100 Mg Tabs (Sitagliptin phosphate) .Marland Kitchen.. 1 by mouth once daily 3)  Furosemide 40 Mg Tabs (Furosemide) .... Take 1 by mouth every other day, alternating with 1and 1/2 by mouth every other day 4)  Lisinopril 10 Mg Tabs (Lisinopril) .... 1/2 by mouth once daily 5)  Ecotrin Low Strength 81 Mg Tbec (Aspirin) .Marland Kitchen.. 1po qd 6)  Lanoxin 0.125 Mg Tabs (Digoxin) .Marland Kitchen.. 1 by mouth qd 7)  Carvedilol 12.5 Mg Tabs (Carvedilol) .Marland Kitchen.. 1 by mouth bid 8)  Simvastatin 20 Mg Tabs (Simvastatin) .Marland Kitchen.. 1 by mouth qhs 9)  Spironolactone 25 Mg Tabs (Spironolactone) .... 1/2  by mouth once daily 10)  Diazepam 10 Mg Tabs (Diazepam) .... 1/2 to 1 by mouth at bedtime prn 11)  Metformin Hcl 500 Mg Tabs (Metformin hcl) .Marland Kitchen.. 1 by mouth once daily 12)  Klor-con  M20 20 Meq Cr-tabs (Potassium chloride crys cr) .Marland Kitchen.. 1po once daily 13)  Benefiber Powd (Wheat dextrin) .... 2 tablespoons once daily 14)  Citracal/vitamin D  250-200 Mg-unit Tabs (Calcium citrate-vitamin d) .Marland Kitchen.. 1 by mouth once daily 15)  Vitamin C 500 Mg Tabs (Ascorbic acid) .Marland Kitchen.. 1 by mouth once daily 16)  Accu-chek Aviva Strp (Glucose blood) .... Use asd two times a day  - code 250.02 17)  Paroxetine Hcl 10 Mg Tabs (Paroxetine hcl) .Marland Kitchen.. 1po once daily  Patient Instructions: 1)  Please take all new medications as prescribed 2)  Continue all previous medications as before this visit  3)  Please schedule a follow-up appointment in 6 months. Prescriptions: PAROXETINE HCL 10 MG TABS (PAROXETINE HCL) 1po once daily  #90 x 3   Entered and Authorized by:   Corwin Levins MD   Signed by:   Corwin Levins MD on 03/19/2010   Method used:   Print then Give to Patient   RxID:   [id number]

## 2010-12-16 NOTE — Letter (Signed)
Summary: Addendum for 02-15-10 Visit/[facility]  Addendum for 02-15-10 Visit/[facility]   Imported By: Sherian Rein 02/19/2010 09:30:52  _____________________________________________________________________  External Attachment:    Type:   Image     Comment:   External Document

## 2010-12-16 NOTE — Letter (Signed)
Summary: OV note & addendem/[facility]  OV note & addendem/[facility]   Imported By: Lester [place] 07/08/2010 09:50:49  _____________________________________________________________________  External Attachment:    Type:   Image     Comment:   External Document

## 2010-12-16 NOTE — Progress Notes (Signed)
Summary: elevated A1c  Phone Note Call from Patient Call back at Home Phone [phone]   Caller: Patient Summary of Call: Pt called stating she saw her Cardiologist yesterday and was advised via Labs that her A1C was elevated at 7.2. Cardiology will send a copy of labs for JWJ to review but they advised pt to call MD. Pt says she was taking Metformin 500 2 tabs once daily but was reduced to 1 tab once daily. Pt is requesting to go back to 2 tabs per advisement of Cardiologist, please advise. Initial call taken by: Margaret Pyle, CMA,  October 22, 2010 8:44 AM  Follow-up for Phone Call        ok to go back to 2 Follow-up by: Corwin Levins MD,  October 22, 2010 1:21 PM  Additional Follow-up for Phone Call Additional follow up Details #1::        Pt advised and agreed Additional Follow-up by: Margaret Pyle, CMA,  October 22, 2010 1:23 PM    New/Updated Medications: METFORMIN HCL 500 MG TABS (METFORMIN HCL) 1 by mouth two times a day

## 2010-12-16 NOTE — Letter (Signed)
Summary: [facility]  [facility]   Imported By: Sherian Rein 09/17/2010 10:33:24  _____________________________________________________________________  External Attachment:    Type:   Image     Comment:   External Document

## 2010-12-16 NOTE — Progress Notes (Signed)
Summary: Accu-Chek readings  Accu-Chek readings   Imported By: Lester [place] 12/21/2009 10:06:46  _____________________________________________________________________  External Attachment:    Type:   Image     Comment:   External Document

## 2010-12-16 NOTE — Letter (Signed)
Summary: [facility]  [facility]   Imported By: Lester [place] 02/18/2010 10:43:35  _____________________________________________________________________  External Attachment:    Type:   Image     Comment:   External Document

## 2010-12-16 NOTE — Letter (Signed)
Summary: Diabetic Supplies/[facility]  Diabetic Supplies/[facility]   Imported By: Sherian Rein 03/17/2010 10:21:01  _____________________________________________________________________  External Attachment:    Type:   Image     Comment:   External Document

## 2010-12-16 NOTE — Letter (Signed)
Summary: [facility]  [facility]   Imported By: Sherian Rein 09/10/2010 14:44:12  _____________________________________________________________________  External Attachment:    Type:   Image     Comment:   External Document

## 2010-12-16 NOTE — Miscellaneous (Signed)
Summary: BONE DENSITY  Clinical Lists Changes  Orders: Added new Test order of T-Bone Densitometry (77080) - Signed Added new Test order of T-Lumbar Vertebral Assessment (77082) - Signed 

## 2010-12-16 NOTE — Assessment & Plan Note (Signed)
Summary: 3 MO ROV / NWS   Vital Signs:  Patient profile:   77 year old female Height:      65 inches Weight:      108 pounds BMI:     18.04 O2 Sat:      97 % on Room air Temp:     97.6 degrees F oral Pulse rate:   69 / minute BP sitting:   110 / 68  (left arm) Cuff size:   regular  Vitals Entered ByZella Ball Ewing (December 18, 2009 9:48 AM)  O2 Flow:  Room air CC: 3 Mo ROV/RE   CC:  3 Mo ROV/RE.  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   She requests a1c today and is very fastidious over her health.  Pt denies CP, sob, doe, wheezing, orthopnea, pnd, worsening LE edema, palps, dizziness or syncope  Pt denies new neuro symptoms such as headache, facial or extremity weakness   Pt denies polydipsia, polyuria, or low sugar symptoms such as shakiness improved with eating.  Overall good compliance with meds, trying to follow low chol, DM diet, wt as able.  Has had a few sugars on the lower side at 88 though no symptoms.   Here for wellness Diet: Heart Healthy or DM if diabetic Physical Activities: Sedentary Depression/mood screen: Positive as above - no suicidal ideation or panic Hearing: Intact bilateral Visual Acuity: Grossly normal ADL's: Capable  Fall Risk: None Home Safety: Good End-of-Life Planning: Advance directive - Full code/I agree   Problems Prior to Update: 1)  Insomnia-sleep Disorder-unspec  (ICD-780.52) 2)  Depression  (ICD-311) 3)  Anxiety  (ICD-300.00) 4)  Cerumen  Impaction, Bilateral  (ICD-380.4) 5)  Fatigue  (ICD-780.79) 6)  Urinary Frequency  (ICD-788.41) 7)  Allergic Rhinitis  (ICD-477.9) 8)  Coronary Artery Disease  (ICD-414.00) 9)  Diverticulosis, Colon  (ICD-562.10) 10)  Dvt, Hx of  (ICD-V12.51) 11)  Uti  (ICD-599.0) 12)  Family History Diabetes 1st Degree Relative  (ICD-V18.0) 13)  Breast Cancer, Hx of  (ICD-V10.3) 14)  Osteoporosis  (ICD-733.00) 15)  Colonic Polyps, Hx of  (ICD-V12.72) 16)  Congestive Heart Failure  (ICD-428.0) 17)  Hypertension  (ICD-401.9) 18)  Hyperlipidemia  (ICD-272.4) 19)  Diabetes Mellitus, Type II  (ICD-250.00)  Medications Prior to Update: 1)  Boniva 150 Mg  Tabs (Ibandronate Sodium) .Marland Kitchen.. 1 By Mouth Q Month 2)  Januvia 100 Mg  Tabs (Sitagliptin Phosphate) .Marland Kitchen.. 1 By Mouth Once Daily 3)  Furosemide 40 Mg  Tabs (Furosemide) .Marland Kitchen.. 1 and 1/2 By Mouth Qam 4)  Lisinopril 10 Mg  Tabs (Lisinopril) .... 1/2 By Mouth Once Daily 5)  Ecotrin Low Strength 81 Mg  Tbec (Aspirin) .Marland Kitchen.. 1po Qd 6)  Lanoxin 0.125 Mg  Tabs (Digoxin) .Marland Kitchen.. 1 By Mouth Qd 7)  Carvedilol 12.5 Mg  Tabs (Carvedilol) .Marland Kitchen.. 1 By Mouth Bid 8)  Simvastatin 20 Mg  Tabs (Simvastatin) .Marland Kitchen.. 1 By Mouth Qhs  9)  Spironolactone 25 Mg  Tabs (Spironolactone) .... 1/2  By Mouth Once Daily 10)  Diazepam 10 Mg  Tabs (Diazepam) .... 1/2 To 1 By Mouth At Bedtime Prn 11)  Metformin Hcl 500 Mg Tabs (Metformin Hcl) .... 2 By Mouth Once Daily 12)  Klor-Con M20 20 Meq Cr-Tabs (Potassium Chloride Crys Cr) .Marland Kitchen.. 1po Once Daily 13)  Benefiber  Powd (Wheat Dextrin) .... 2 Tablespoons Once Daily 14)  Citracal/vitamin D 250-200 Mg-Unit Tabs (Calcium Citrate-Vitamin D) .Marland Kitchen.. 1 By Mouth Once Daily 15)  Vitamin C 500 Mg Tabs (Ascorbic Acid) .Marland Kitchen.. 1 By Mouth Once Daily 16)  Accu-Chek Aviva  Strp (Glucose Blood) .... Use Asd Two Times A Day  - Code 250.02  Current Medications (verified): 1)  Boniva 150 Mg  Tabs (Ibandronate Sodium) .Marland Kitchen.. 1 By Mouth Q Month 2)  Januvia 100 Mg  Tabs  (Sitagliptin Phosphate) .Marland Kitchen.. 1 By Mouth Once Daily 3)  Furosemide 40 Mg  Tabs (Furosemide) .Marland Kitchen.. 1 and 1/2 By Mouth Qam 4)  Lisinopril 10 Mg  Tabs (Lisinopril) .... 1/2 By Mouth Once Daily 5)  Ecotrin Low Strength 81 Mg  Tbec (Aspirin) .Marland Kitchen.. 1po Qd 6)  Lanoxin 0.125 Mg  Tabs (Digoxin) .Marland Kitchen.. 1 By Mouth Qd 7)  Carvedilol 12.5 Mg  Tabs (Carvedilol) .Marland Kitchen.. 1 By Mouth Bid 8)  Simvastatin 20 Mg  Tabs (Simvastatin) .Marland Kitchen.. 1 By Mouth Qhs 9)  Spironolactone 25 Mg  Tabs (Spironolactone) .... 1/2  By Mouth Once Daily 10)  Diazepam 10 Mg  Tabs (Diazepam) .... 1/2 To 1 By Mouth At Bedtime Prn 11)  Metformin Hcl 500 Mg Tabs (Metformin Hcl) .Marland Kitchen.. 1 By Mouth Once Daily 12)  Klor-Con M20 20 Meq Cr-Tabs (Potassium Chloride Crys Cr) .Marland Kitchen.. 1po Once Daily 13)  Benefiber  Powd (Wheat Dextrin) .... 2 Tablespoons Once Daily 14)  Citracal/vitamin D 250-200 Mg-Unit Tabs (Calcium Citrate-Vitamin D) .Marland Kitchen.. 1 By Mouth Once Daily 15)  Vitamin C 500 Mg Tabs (Ascorbic Acid) .Marland Kitchen.. 1 By Mouth Once Daily 16)  Accu-Chek Aviva  Strp (Glucose Blood) .... Use Asd Two Times A Day  - Code 250.02 17)  Paroxetine Hcl 10 Mg Tabs (Paroxetine Hcl) .Marland Kitchen.. 1 By Mouth Once Daily 18)  Temazepam 15 Mg Caps (Temazepam) .Marland Kitchen.. 1 - 2 By Mouth At Bedtime As Needed  Allergies (verified): No Known Drug Allergies  Past History:  Past Surgical History: Last updated: 09/26/2007 Pacemaker-05/21/2002 Hysterectomy L Breast tumor Resection- 1980s  Family History: Last updated: 04/21/2009 Family History Diabetes 1st degree relative Family History of Arthritis- mother and father brother with CABG brother with prostate problem with up and down PSA  Social History: Last updated: 09/14/2009 Never Smoked Alcohol use-no Married 2 sons lives with husband retired - Counsellor - report clerk Husband now with Multiple Myeloma - sees dr neijstrom/M[facility]illan  Risk Factors: Smoking Status: never (09/26/2007)  Past Medical History: Diabetes mellitus, type  II Hyperlipidemia Hypertension Congestive heart failure Colonic polyps, hx of Osteoporosis Breast cancer, hx of DVT, hx of - LUE Diverticulosis, colon Coronary artery disease - non critical Allergic rhinitis Anxiety Depression - situational  Review of Systems  The patient denies anorexia, fever, weight gain, vision loss, decreased hearing, hoarseness, chest pain, syncope, dyspnea on exertion, peripheral edema, prolonged cough, headaches, hemoptysis, abdominal pain, melena, hematochezia, severe indigestion/heartburn, hematuria, incontinence, genital sores, muscle weakness, suspicious skin lesions, transient blindness, difficulty walking, abnormal bleeding, enlarged lymph nodes, and angioedema.         all otherwise negative per pt -  except severe problem with insomnia since husband has been in NH  Physical Exam  General:  alert and underweight appearing.   Head:  normocephalic and atraumatic.   Eyes:  vision grossly intact, pupils equal, and pupils round.   Ears:  R ear normal and L ear normal.   Nose:  no external deformity and no nasal discharge.   Mouth:  no gingival abnormalities and pharynx pink and moist.   Neck:  supple and no masses.   Lungs:  normal respiratory effort and normal breath sounds.   Heart:  normal rate and regular rhythm.   Abdomen:  soft, non-tender, and normal bowel sounds.   Msk:  no joint tenderness and no joint swelling.   Extremities:  no edema, no erythema  Neurologic:  cranial nerves II-XII intact and strength normal in all extremities.   Psych:  depressed affect, tearful, and moderately anxious.     Impression & Recommendations:  Problem # 1:  Preventive Health Care (ICD-V70.0)  Overall doing well, age appropriate education and counseling updated and referral for appropriate preventive services done unless declined, immunizations up to date or declined, diet counseling done if overweight, urged to quit smoking if smokes , most recent labs  reviewed and current ordered if appropriate, ecg reviewed or declined (interpretation per ECG scanned in the EMR if done); information regarding Medicare Prevention requirements given if appropriate   Orders: First annual wellness visit with prevention plan  ([id number])  Problem # 2:  DEPRESSION (ICD-311)  Her updated medication list for this problem includes:    Diazepam 10 Mg Tabs (Diazepam) .Marland Kitchen... 1/2 to 1 by mouth at bedtime prn    Paroxetine Hcl 10 Mg Tabs (Paroxetine hcl) .Marland Kitchen... 1 by mouth once daily situational, to add the paxil 10 mg per day, consider higher dose if needed after 3 to 4 wks, may helop with regaining some wt as well  Problem # 3:  INSOMNIA-SLEEP DISORDER-UNSPEC (ICD-780.52)  ok for  temazepam 15 at bedtime as needed   Her updated medication list for this problem includes:    Temazepam 15 Mg Caps (Temazepam) .Marland Kitchen... 1 - 2 by mouth at bedtime as needed  Problem # 4:  DIABETES MELLITUS, TYPE II (ICD-250.00)  Her updated medication list for this problem includes:    Januvia 100 Mg Tabs (Sitagliptin phosphate) .Marland Kitchen... 1 by mouth once daily    Lisinopril 10 Mg Tabs (Lisinopril) .Marland Kitchen... 1/2 by mouth once daily    Ecotrin Low Strength 81 Mg Tbec (Aspirin) .Marland Kitchen... 1po qd    Metformin Hcl 500 Mg Tabs (Metformin hcl) .Marland Kitchen... 1 by mouth once daily asympt but overcontrolled with recent wt loss - to reduce the metformin to 1 once daily   Orders: TLB-A1C / Hgb A1C (Glycohemoglobin) (83036-A1C)  Problem # 5:  HYPERLIPIDEMIA (ICD-272.4)  Her updated medication list for this problem includes:    Simvastatin 20 Mg Tabs (Simvastatin) .Marland Kitchen... 1 by mouth qhs  Labs Reviewed: SGOT: 22 (04/21/2008)   SGPT: 16 (04/21/2008)   HDL:50.20 (09/14/2009), 43.0 (10/20/2008)  LDL:57 (09/14/2009), 59 (10/20/2008)  Chol:123 (09/14/2009), 115 (10/20/2008)  Trig:78.0 (09/14/2009), 66 (10/20/2008) stable overall by hx and exam, ok to continue meds/tx as is , Pt to continue diet efforts, good med tolerance; to  check labs - goal LDL less than 70   Complete Medication List: 1)  Boniva 150 Mg Tabs (Ibandronate sodium) .Marland Kitchen.. 1 by mouth q month 2)  Januvia 100 Mg Tabs (Sitagliptin phosphate) .Marland Kitchen.. 1 by mouth once daily 3)  Furosemide 40 Mg Tabs (Furosemide) .Marland Kitchen.. 1 and 1/2 by mouth qam 4)  Lisinopril 10 Mg Tabs (Lisinopril) .... 1/2 by mouth once daily 5)  Ecotrin Low Strength 81 Mg Tbec (Aspirin) .Marland Kitchen.. 1po qd 6)  Lanoxin 0.125 Mg Tabs (Digoxin) .Marland Kitchen.. 1 by mouth qd 7)  Carvedilol 12.5 Mg Tabs (Carvedilol) .Marland Kitchen.. 1 by mouth bid 8)  Simvastatin 20 Mg Tabs (Simvastatin) .Marland Kitchen.. 1 by mouth qhs 9)  Spironolactone 25 Mg Tabs (Spironolactone) .... 1/2  by mouth once daily 10)  Diazepam 10 Mg Tabs (Diazepam) .... 1/2 to 1 by mouth at bedtime prn 11)  Metformin Hcl 500 Mg Tabs (Metformin hcl) .Marland Kitchen.. 1 by mouth once daily 12)  Klor-con M20 20 Meq Cr-tabs (Potassium chloride crys cr) .Marland Kitchen.. 1po once daily 13)  Benefiber Powd (Wheat dextrin) .... 2 tablespoons once daily 14)  Citracal/vitamin D 250-200 Mg-unit Tabs (Calcium citrate-vitamin d) .Marland Kitchen.. 1 by mouth once daily 15)  Vitamin C 500 Mg Tabs (Ascorbic acid) .Marland Kitchen.. 1 by mouth once daily 16)  Accu-chek Aviva Strp (Glucose blood) .... Use asd two times a day  - code 250.02 17)  Paroxetine Hcl 10 Mg Tabs (Paroxetine hcl) .Marland Kitchen.. 1 by mouth once daily 18)  Temazepam 15 Mg Caps (Temazepam) .Marland Kitchen.. 1 - 2 by mouth at bedtime as needed  Patient Instructions: 1)  Please take all new medications as prescribed 2)  Continue all previous medications as before this visit  3)  Please go to the Lab in the basement for your blood tests today  4)  Please schedule a follow-up appointment in 3 months. Prescriptions: METFORMIN HCL 500 MG TABS (METFORMIN HCL) 1 by mouth once daily  #90 x 3   Entered and Authorized by:   Corwin Levins MD   Signed by:   Corwin Levins MD on 12/18/2009   Method used:   Print then Give to Patient   RxID:   [id number] BONIVA 150 MG  TABS (IBANDRONATE SODIUM) 1 by  mouth q month  #3 x 3   Entered and Authorized by:   Corwin Levins MD   Signed by:   Corwin Levins MD on 12/18/2009   Method used:   Print then Give to Patient   RxID:   [id number] JANUVIA 100 MG  TABS (SITAGLIPTIN PHOSPHATE) 1 by mouth once daily  #90 x 3   Entered and Authorized by:   Corwin Levins MD   Signed by:   Corwin Levins MD on 12/18/2009   Method used:   Print then Give to Patient   RxID:   [id number] TEMAZEPAM 15 MG CAPS (TEMAZEPAM) 1 - 2 by mouth at bedtime as needed  #60 x 1   Entered and Authorized by:   Corwin Levins MD   Signed by:   Corwin Levins MD on 12/18/2009   Method used:   Print then Give to Patient   RxID:   [id number] PAROXETINE HCL 10 MG TABS (PAROXETINE HCL) 1 by mouth once daily  #90 x 3   Entered and Authorized by:   Corwin Levins MD   Signed by:   Corwin Levins MD on 12/18/2009   Method used:   Print then Give to Patient   RxID:   [id number]

## 2010-12-16 NOTE — Assessment & Plan Note (Signed)
Summary: 6 month follow up-lb   Vital Signs:  Patient profile:   77 year old female Height:      60 inches Weight:      114 pounds BMI:     22.34 O2 Sat:      98 % on Room air Temp:     98.1 degrees F oral Pulse rate:   77 / minute BP sitting:   102 / 60  (left arm) Cuff size:   regular  Vitals Entered By: Zella Ball Ewing CMA Duncan Dull) (September 20, 2010 10:11 AM)  O2 Flow:  Room air CC: 6 month ROV/RE   CC:  6 month ROV/RE.  History of Present Illness: here to f/u - has had signifcant cardiac further eval and tx with [facility] and Dr Alanda Amass;  had recent a1c 7.0;  Pt denies CP, worsening sob, doe, wheezing, orthopnea, pnd, worsening LE edema, palps, dizziness or syncope  Pt denies new neuro symptoms such as headache, facial or extremity weakness  Pt denies polydipsia, polyuria, or low sugar symptoms such as shakiness improved with eating.  Overall good compliance with meds, trying to follow low chol, DM diet, wt stable, little excercise however  Had recent echo per Dr Alanda Amass, then to [facility] for change of pacemaker/defibrillatr;  also had TEE to assess valve dysfunction and LA (no clot per report) ;  No fever, wt loss, night sweats, loss of appetite or other constitutional symptoms .  Still funcitonal class 1, though EF 10-15%;  Due to f/u soon iwth Dr Okey Dupre ([facility] - main).  Son going with her to appt (husband passed away);  No fever, wt loss, night sweats, loss of appetite or other constitutional symptoms  CBG's in the low 100's.  Wt now back up to 114 from low of 108 when her husband was ill  a few yrs ago.   Also started on two times a day iron supplement with f/u cbc per card due dec 04-24-2010.   Husband died 01-25-2010- had osme initial depresion - now off the paxil adn doing well. Denies worsening depressive symptoms, suicidal ideation, or panic.    Preventive Screening-Counseling & Management      Drug Use:  no.    Problems Prior to Update: 1)   Anemia-nos  (ICD-285.9) 2)  Osteopenia  (ICD-733.90) 3)  Preventive Health Care  (ICD-V70.0) 4)  Insomnia-sleep Disorder-unspec  (ICD-780.52) 5)  Depression  (ICD-311) 6)  Anxiety  (ICD-300.00) 7)  Cerumen Impaction, Bilateral  (ICD-380.4) 8)  Fatigue  (ICD-780.79) 9)  Urinary Frequency  (ICD-788.41) 10)  Allergic Rhinitis  (ICD-477.9) 11)  Coronary Artery Disease  (ICD-414.00) 12)  Diverticulosis, Colon  (ICD-562.10) 13)  Dvt, Hx of  (ICD-V12.51) 14)  Uti  (ICD-599.0) 15)  Family History Diabetes 1st Degree Relative  (ICD-V18.0) 16)  Breast Cancer, Hx of  (ICD-V10.3) 17)  Osteoporosis  (ICD-733.00) 18)  Colonic Polyps, Hx of  (ICD-V12.72) 19)  Congestive Heart Failure  (ICD-428.0) 20)  Hypertension  (ICD-401.9) 21)  Hyperlipidemia  (ICD-272.4) 22)  Diabetes Mellitus, Type II  (ICD-250.00)  Medications Prior to Update: 1)  Boniva 150 Mg  Tabs (Ibandronate Sodium) .Marland Kitchen.. 1 By Mouth Q Month 2)  Januvia 100 Mg  Tabs (Sitagliptin Phosphate) .Marland Kitchen.. 1 By Mouth Once Daily 3)  Furosemide 40 Mg  Tabs (Furosemide) .... Take 1 By Mouth Every Other Day, Alternating With 1and 1/2 By Mouth Every Other Day 4)  Lisinopril 10 Mg  Tabs (Lisinopril) .Marland KitchenMarland KitchenMarland Kitchen  1/2 By Mouth Once Daily 5)  Ecotrin Low Strength 81 Mg  Tbec (Aspirin) .Marland Kitchen.. 1po Qd 6)  Lanoxin 0.125 Mg  Tabs (Digoxin) .Marland Kitchen.. 1 By Mouth Qd 7)  Carvedilol 12.5 Mg  Tabs (Carvedilol) .Marland Kitchen.. 1 By Mouth Bid 8)  Simvastatin 20 Mg  Tabs (Simvastatin) .Marland Kitchen.. 1 By Mouth Qhs 9)  Spironolactone 25 Mg  Tabs (Spironolactone) .... 1/2  By Mouth Once Daily 10)  Diazepam 10 Mg  Tabs (Diazepam) .... 1/2 To 1 By Mouth At Bedtime Prn 11)  Metformin Hcl 500 Mg Tabs (Metformin Hcl) .Marland Kitchen.. 1 By Mouth Once Daily 12)  Klor-Con M20 20 Meq Cr-Tabs (Potassium Chloride Crys Cr) .Marland Kitchen.. 1po Once Daily 13)  Benefiber  Powd (Wheat Dextrin) .... 2 Tablespoons Once Daily 14)  Citracal/vitamin D 250-200 Mg-Unit Tabs (Calcium Citrate-Vitamin D) .Marland Kitchen.. 1 By Mouth Once Daily 15)  Vitamin C 500 Mg  Tabs (Ascorbic Acid) .Marland Kitchen.. 1 By Mouth Once Daily 16)  Accu-Chek Aviva  Strp (Glucose Blood) .... Use Asd Two Times A Day  - Code 250.02 17)  Paroxetine Hcl 10 Mg Tabs (Paroxetine Hcl) .Marland Kitchen.. 1po Once Daily  Current Medications (verified): 1)  Boniva 150 Mg  Tabs (Ibandronate Sodium) .Marland Kitchen.. 1 By Mouth Q Month 2)  Januvia 100 Mg  Tabs (Sitagliptin Phosphate) .Marland Kitchen.. 1 By Mouth Once Daily 3)  Furosemide 40 Mg  Tabs (Furosemide) .... Take 1 By Mouth Every Other Day, Alternating With 1and 1/2 By Mouth Every Other Day 4)  Lisinopril 10 Mg  Tabs (Lisinopril) .... 1/2 By Mouth Once Daily 5)  Ecotrin Low Strength 81 Mg  Tbec (Aspirin) .Marland Kitchen.. 1po Qd 6)  Lanoxin 0.125 Mg  Tabs (Digoxin) .Marland Kitchen.. 1 By Mouth Qd 7)  Carvedilol 12.5 Mg  Tabs (Carvedilol) .Marland Kitchen.. 1 By Mouth Bid 8)  Simvastatin 20 Mg  Tabs (Simvastatin) .Marland Kitchen.. 1 By Mouth Qhs 9)  Spironolactone 25 Mg  Tabs (Spironolactone) .... 1/2  By Mouth Once Daily 10)  Diazepam 10 Mg  Tabs (Diazepam) .... 1/2 To 1 By Mouth At Bedtime Prn 11)  Metformin Hcl 500 Mg Tabs (Metformin Hcl) .Marland Kitchen.. 1 By Mouth Once Daily 12)  Klor-Con M20 20 Meq Cr-Tabs (Potassium Chloride Crys Cr) .Marland Kitchen.. 1po Once Daily 13)  Benefiber  Powd (Wheat Dextrin) .... 2 Tablespoons Once Daily 14)  Citracal/vitamin D 250-200 Mg-Unit Tabs (Calcium Citrate-Vitamin D) .Marland Kitchen.. 1 By Mouth Once Daily 15)  Vitamin C 500 Mg Tabs (Ascorbic Acid) .Marland Kitchen.. 1 By Mouth Once Daily 16)  Accu-Chek Aviva  Strp (Glucose Blood) .... Use Asd Two Times A Day  - Code 250.02 17)  Losartan Potassium 50 Mg Tabs (Losartan Potassium) .Marland Kitchen.. 1 By Mouth Once Daily 18)  Poly-Iron 150 150 Mg Caps (Polysaccharide Iron Complex) .Marland Kitchen.. 1 By Mouth Two Times A Day  Allergies (verified): No Known Drug Allergies  Past History:  Social History: Last updated: 09/20/2010 Never Smoked Alcohol use-no Married 2 sons lives with husband retired - Counsellor - report clerk Husband now with Multiple Myeloma - sees dr neijstrom/[place] Drug  use-no  Risk Factors: Smoking Status: never (09/26/2007)  Past Medical History: Diabetes mellitus, type II Hyperlipidemia Hypertension Congestive heart failure Non ischemic cardiomyopathyy - ER 10-15% Colonic polyps, hx of Osteoporosis Breast cancer, hx of DVT, hx of - LUE Diverticulosis, colon Coronary artery disease - non critical Allergic rhinitis Anxiety Depression - situational Osteopenia Anemia-NOS  Past Surgical History: Pacemaker-05/21/2002; s/p re-do pacemaker/defibrillator oct 2011 - [facility] Hysterectomy L Breast tumor Resection- 1980s  Social  History: Never Smoked Alcohol use-no Married 2 sons lives with husband retired - Counsellor - report clerk Husband now with Multiple Myeloma - sees dr neijstrom/[place] Drug use-no Drug Use:  no  Review of Systems       all otherwise negative per pt -    Physical Exam  General:  alert and well-developed.   Head:  normocephalic and atraumatic.   Eyes:  vision grossly intact, pupils equal, and pupils round.   Ears:  R ear normal and L ear normal.   Nose:  no external deformity and no nasal discharge.   Mouth:  no gingival abnormalities and pharynx pink and moist.   Neck:  supple and no masses.   Lungs:  normal respiratory effort and normal breath sounds.   Heart:  normal rate and regular rhythm.   Extremities:  no edema, no erythema  Psych:  not anxious appearing and not depressed appearing.     Impression & Recommendations:  Problem # 1:  HYPERTENSION (ICD-401.9)  Her updated medication list for this problem includes:    Furosemide 40 Mg Tabs (Furosemide) .Marland Kitchen... Take 1 by mouth every other day, alternating with 1and 1/2 by mouth every other day    Lisinopril 10 Mg Tabs (Lisinopril) .Marland Kitchen... 1/2 by mouth once daily    Carvedilol 12.5 Mg Tabs (Carvedilol) .Marland Kitchen... 1 by mouth bid    Spironolactone 25 Mg Tabs (Spironolactone) .Marland Kitchen... 1/2  by mouth once daily    Losartan Potassium 50 Mg Tabs (Losartan  potassium) .Marland Kitchen... 1 by mouth once daily  BP today: 102/60 Prior BP: 102/62 (03/19/2010)  Labs Reviewed: K+: 4.7 (03/19/2010) Creat: : 1.2 (03/19/2010)   Chol: 125 (03/19/2010)   HDL: 47.70 (03/19/2010)   LDL: 64 (03/19/2010)   TG: 69.0 (03/19/2010) stable overall by hx and exam, ok to continue meds/tx as is   Problem # 2:  HYPERLIPIDEMIA (ICD-272.4)  Her updated medication list for this problem includes:    Simvastatin 20 Mg Tabs (Simvastatin) .Marland Kitchen... 1 by mouth qhs  Labs Reviewed: SGOT: 22 (04/21/2008)   SGPT: 16 (04/21/2008)   HDL:47.70 (03/19/2010), 50.20 (09/14/2009)  LDL:64 (03/19/2010), 57 (09/14/2009)  Chol:125 (03/19/2010), 123 (09/14/2009)  Trig:69.0 (03/19/2010), 78.0 (09/14/2009) stable overall by hx and exam, ok to continue meds/tx as is   Problem # 3:  DIABETES MELLITUS, TYPE II (ICD-250.00)  Her updated medication list for this problem includes:    Januvia 100 Mg Tabs (Sitagliptin phosphate) .Marland Kitchen... 1 by mouth once daily    Lisinopril 10 Mg Tabs (Lisinopril) .Marland Kitchen... 1/2 by mouth once daily    Ecotrin Low Strength 81 Mg Tbec (Aspirin) .Marland Kitchen... 1po qd    Metformin Hcl 500 Mg Tabs (Metformin hcl) .Marland Kitchen... 1 by mouth once daily    Losartan Potassium 50 Mg Tabs (Losartan potassium) .Marland Kitchen... 1 by mouth once daily  Labs Reviewed: Creat: 1.2 (03/19/2010)    Reviewed HgBA1c results: 6.8 (03/19/2010)  6.8 (12/18/2009) stable overall by hx and exam, ok to continue meds/tx as is   Problem # 4:  DEPRESSION (ICD-311)  The following medications were removed from the medication list:    Paroxetine Hcl 10 Mg Tabs (Paroxetine hcl) .Marland Kitchen... 1po once daily Her updated medication list for this problem includes:    Diazepam 10 Mg Tabs (Diazepam) .Marland Kitchen... 1/2 to 1 by mouth at bedtime prn stable overall by hx and exam, ok to continue meds/tx as is   Complete Medication List: 1)  Boniva 150 Mg Tabs (Ibandronate sodium) .Marland Kitchen.. 1 by mouth q  month 2)  Januvia 100 Mg Tabs (Sitagliptin phosphate) .Marland Kitchen.. 1 by  mouth once daily 3)  Furosemide 40 Mg Tabs (Furosemide) .... Take 1 by mouth every other day, alternating with 1and 1/2 by mouth every other day 4)  Lisinopril 10 Mg Tabs (Lisinopril) .... 1/2 by mouth once daily 5)  Ecotrin Low Strength 81 Mg Tbec (Aspirin) .Marland Kitchen.. 1po qd 6)  Lanoxin 0.125 Mg Tabs (Digoxin) .Marland Kitchen.. 1 by mouth qd 7)  Carvedilol 12.5 Mg Tabs (Carvedilol) .Marland Kitchen.. 1 by mouth bid 8)  Simvastatin 20 Mg Tabs (Simvastatin) .Marland Kitchen.. 1 by mouth qhs 9)  Spironolactone 25 Mg Tabs (Spironolactone) .... 1/2  by mouth once daily 10)  Diazepam 10 Mg Tabs (Diazepam) .... 1/2 to 1 by mouth at bedtime prn 11)  Metformin Hcl 500 Mg Tabs (Metformin hcl) .Marland Kitchen.. 1 by mouth once daily 12)  Klor-con M20 20 Meq Cr-tabs (Potassium chloride crys cr) .Marland Kitchen.. 1po once daily 13)  Benefiber Powd (Wheat dextrin) .... 2 tablespoons once daily 14)  Citracal/vitamin D 250-200 Mg-unit Tabs (Calcium citrate-vitamin d) .Marland Kitchen.. 1 by mouth once daily 15)  Vitamin C 500 Mg Tabs (Ascorbic acid) .Marland Kitchen.. 1 by mouth once daily 16)  Accu-chek Aviva Strp (Glucose blood) .... Use asd two times a day  - code 250.02 17)  Losartan Potassium 50 Mg Tabs (Losartan potassium) .Marland Kitchen.. 1 by mouth once daily 18)  Poly-iron 150 150 Mg Caps (Polysaccharide iron complex) .Marland Kitchen.. 1 by mouth two times a day  Patient Instructions: 1)  Continue all previous medications as before this visit  2)  Please keep your folwoiup appt iwth cardiology as you have planned 3)  Please schedule a follow-up appointment in 6 months.   Orders Added: 1)  Est. Patient Level IV [60454] 2)  Est. Patient Level IV [09811]

## 2011-01-13 ENCOUNTER — Encounter: Payer: Self-pay | Admitting: Internal Medicine

## 2011-01-25 NOTE — Letter (Signed)
Summary: Governor Rooks MD  Governor Rooks MD   Imported By: Lester Hercules 01/20/2011 09:50:59  _____________________________________________________________________  External Attachment:    Type:   Image     Comment:   External Document

## 2011-03-14 ENCOUNTER — Encounter: Payer: Self-pay | Admitting: Internal Medicine

## 2011-03-17 ENCOUNTER — Encounter: Payer: Self-pay | Admitting: Internal Medicine

## 2011-03-17 DIAGNOSIS — Z0000 Encounter for general adult medical examination without abnormal findings: Secondary | ICD-10-CM | POA: Insufficient documentation

## 2011-03-21 ENCOUNTER — Ambulatory Visit (INDEPENDENT_AMBULATORY_CARE_PROVIDER_SITE_OTHER): Payer: Medicare Other | Admitting: Internal Medicine

## 2011-03-21 ENCOUNTER — Encounter: Payer: Self-pay | Admitting: Internal Medicine

## 2011-03-21 VITALS — BP 112/64 | HR 76 | Temp 98.0°F | Ht 64.0 in | Wt 110.0 lb

## 2011-03-21 DIAGNOSIS — I10 Essential (primary) hypertension: Secondary | ICD-10-CM

## 2011-03-21 DIAGNOSIS — S0990XA Unspecified injury of head, initial encounter: Secondary | ICD-10-CM

## 2011-03-21 DIAGNOSIS — E785 Hyperlipidemia, unspecified: Secondary | ICD-10-CM

## 2011-03-21 DIAGNOSIS — E119 Type 2 diabetes mellitus without complications: Secondary | ICD-10-CM

## 2011-03-21 MED ORDER — METFORMIN HCL 500 MG PO TABS: 500.0000 mg | ORAL_TABLET | Freq: Every day | ORAL | Status: DC

## 2011-03-21 NOTE — Patient Instructions (Signed)
Continue all other medications as before (including the once dialy metformin) Please keep your appointments with your specialists as you have planned Please return in 6 months

## 2011-03-21 NOTE — Assessment & Plan Note (Signed)
stable overall by hx and exam, most recent lab reviewed with pt, and pt to continue medical treatment as before  Lab Results  Component Value Date   HGBA1C 6.8* 03/19/2010    And most recent per [facility] lab per Dr Ethlyn Gallery a1c 6.7 - Continue all other medications as before

## 2011-03-21 NOTE — Assessment & Plan Note (Signed)
stable overall by hx and exam, most recent lab reviewed with pt, and pt to continue medical treatment as before Lab Results  Component Value Date   WBC 7.5 04/21/2008   HGB 13.5 04/21/2008   HCT 39.9 04/21/2008   PLT 174 04/21/2008   CHOL 125 03/19/2010   TRIG 69.0 03/19/2010   HDL 47.70 03/19/2010   ALT 16 04/21/2008   AST 22 04/21/2008   NA 139 03/19/2010   K 4.7 03/19/2010   CL 97 03/19/2010   CREATININE 1.2 03/19/2010   BUN 23 03/19/2010   CO2 35* 03/19/2010   TSH 1.14 04/21/2008   HGBA1C 6.8* 03/19/2010   BP Readings from Last 3 Encounters:  03/21/11 112/64  11/22/10 100/66  09/20/10 102/60

## 2011-03-21 NOTE — Progress Notes (Signed)
Subjective:    Patient ID: Priscilla Anderson, female    DOB: 1934/10/31, 77 y.o.   MRN: [id number]  HPI Here to f/u; overall doing ok,  Pt denies chest pain, increased sob or doe, wheezing, orthopnea, PND, increased LE swelling, palpitations, dizziness or syncope.  Pt denies new neurological symptoms such as new headache, or facial or extremity weakness or numbness   Pt denies polydipsia, polyuria, or low sugar symptoms such as weakness or confusion improved with po intake.  Pt states overall good compliance with meds, trying to follow lower cholesterol, diabetic diet, wt overall stable but little exercise however.  Most recent mammogram apr 6 negative. Did fall 2 days ago down her steps wet with rain, fell backwards, hit right post head without LOC, has some soreness but no swelling, blood, and has felt ok since then except for the soreness.  Went to church and no less activity,  No blurred vision more than usual, has optho appt at [facility] center, usually seen every yr but her MD moved, so she has new MD not yet seen. Has some right dry eye when she gets up at night, despite the Refresh optive she uses.    No past medical history on file. Past Surgical History  Procedure Date  . Pacemaker insertion 05/21/2002    s/p re-do pacemaker/defibrillator October 2011- [facility].  . Abdominal hysterectomy   . Left breast tumor resection 1980    reports that she has never smoked. She does not have any smokeless tobacco history on file. She reports that she does not use illicit drugs. Her alcohol history not on file. family history includes Arthritis in her father and mother; Diabetes in her other; Heart disease in her brother; and Prostate cancer in her brother. No Known Allergies Current Outpatient Prescriptions on File Prior to Visit  Medication Sig Dispense Refill  . Ascorbic Acid (VITAMIN C) 500 MG tablet Take 500 mg by mouth daily.        Marland Kitchen aspirin 81 MG tablet Take 81 mg by mouth daily.        .  Calcium Citrate-Vitamin D (CITRACAL/VITAMIN D) 250-200 MG-UNIT TABS Take by mouth daily.        . carvedilol (COREG) 12.5 MG tablet Take 12.5 mg by mouth 2 (two) times daily.        . diazepam (VALIUM) 10 MG tablet Take 10 mg by mouth. 1/2 to 1 by mouth at bedtime as needed       . digoxin (LANOXIN) 0.125 MG tablet Take 125 mcg by mouth daily.        . furosemide (LASIX) 40 MG tablet Take 40 mg by mouth. Take 1 by mouth every other day, alternating with 1 and 1/2 by mouth every other day       . glucose blood (ACCU-CHEK AVIVA) test strip 1 each by Other route 2 (two) times daily. Code 250.02       . ibandronate (BONIVA) 150 MG tablet Take 150 mg by mouth every 30 (thirty) days. Take in the morning with a full glass of water, on an empty stomach, and do not take anything else by mouth or lie down for the next 30 min.       Marland Kitchen losartan (COZAAR) 50 MG tablet Take 50 mg by mouth daily.        . metFORMIN (GLUCOPHAGE) 500 MG tablet Take 500 mg by mouth 2 (two) times daily at 10 AM and 5 PM. Every morning       .  potassium chloride SA (KLOR-CON M20) 20 MEQ tablet Take 20 mEq by mouth daily.        . simvastatin (ZOCOR) 20 MG tablet Take 20 mg by mouth at bedtime.        . sitaGLIPtan (JANUVIA) 100 MG tablet Take 100 mg by mouth daily.        Marland Kitchen spironolactone (ALDACTONE) 25 MG tablet Take 25 mg by mouth. 1/2 by mouth once daily       . Wheat Dextrin (BENEFIBER) POWD Take by mouth. 2 tablespoons once daily       . cephALEXin (KEFLEX) 500 MG capsule Take 500 mg by mouth 3 (three) times daily.        . iron polysaccharides (POLY-IRON 150) 150 MG capsule Take 150 mg by mouth 2 (two) times daily.        Marland Kitchen lisinopril (PRINIVIL,ZESTRIL) 10 MG tablet Take 10 mg by mouth. 1/2 by mouth once daily        Review of Systems Review of Systems  Constitutional: Negative for diaphoresis and unexpected weight change.  HENT: Negative for drooling and tinnitus.   Eyes: Negative for photophobia and visual disturbance.    Respiratory: Negative for choking and stridor.   Gastrointestinal: Negative for vomiting and blood in stool.  Genitourinary: Negative for hematuria and decreased urine volume.  Musculoskeletal: Negative for gait problem.  Skin: Negative for color change and wound.  Neurological: Negative for tremors and numbness.  Psychiatric/Behavioral: Negative for decreased concentration. The patient is not hyperactive.   Has f/u appt with Dr Adline Mango June 7    Objective:   Physical Exam BP 112/64  Pulse 76  Temp(Src) 98 F (36.7 C) (Oral)  Ht 5\' 4"  (1.626 m)  Wt 110 lb (49.896 kg)  BMI 18.88 kg/m2  SpO2 98% Physical Exam  VS noted Constitutional: Pt appears well-developed and well-nourished.  HENT: Head: Normocephalic.  No JVD Right Ear: External ear normal.  Left Ear: External ear normal.  Eyes: Conjunctivae and EOM are normal. Pupils are equal, round, and reactive to light.  Neck: Normal range of motion. Neck supple.  Cardiovascular: Normal rate and regular rhythm.   Pulmonary/Chest: Effort normal and breath sounds normal.  Abd:  Soft, NT, non-distended, + BS Neurological: Pt is alert. No cranial nerve deficit.  Motor/dtr's intact Skin: Skin is warm. No erythema.  Psychiatric: Pt behavior is normal. Thought content normal.         Assessment & Plan:

## 2011-03-21 NOTE — Assessment & Plan Note (Signed)
stable overall by hx and exam, most recent lab reviewed with pt, and pt to continue medical treatment as before  .most recent LDL 58, Continue all other medications as before

## 2011-03-21 NOTE — Assessment & Plan Note (Signed)
stable overall by hx and exam,  and pt to continue medical treatment as before  Exam benign, no need for imaging at this time

## 2011-03-29 ENCOUNTER — Encounter: Payer: Self-pay | Admitting: Internal Medicine

## 2011-04-01 NOTE — Assessment & Plan Note (Signed)
[facility]                           GASTROENTEROLOGY OFFICE NOTE   Priscilla Anderson, Priscilla Anderson                      MRN:          [id number]  DATE:07/31/2006                            DOB:          1934/04/04    Priscilla Anderson is a 77 year old African-American female who returns for  followup of constipation and a history of adenomatous colon polyps.  Please  see my note from February 10, 2006.  She underwent implantation of a pacemaker  defibrillator at [facility] earlier this year.  She was placed  on Coumadin for a venous thrombosis that she states was related to her  pacemaker defibrillator implantation.  The Coumadin was discontinued  permanently on June 07, 2006.  Her constipation has been doing better over  the past several weeks.  She notes no rectal bleeding or change in stool  caliber.   CURRENT MEDICATIONS:  Listed on the chart, updated and reviewed.   MEDICATION ALLERGIES:  None known.   EXAMINATION:  No acute distress.  Weight 109.4 pounds.  Blood pressure is  98/62.  Pulse 88 and regular.  HEENT:  Anicteric sclerae.  Oropharynx clear.  CHEST:  Clear to auscultation bilaterally.  Her pacemaker defibrillator is  palpable in her left upper chest.  CARDIAC:  Regular rate and rhythm without murmurs appreciated.  ABDOMEN:  Soft, nontender, nondistended.  Normal active bowel sounds.  No  palpable organomegaly, masses or hernias.   ASSESSMENT/PLAN:  Personal history of adenomatous colon polyps.  Resolved  constipation.  Risks, benefits and alternatives to colonoscopy with possible  biopsy and possible polypectomy discussed with the patient and she consents  to the procedure, to be scheduled electively.  Her defibrillator will need  to be switched off if electrocautery is needed for polypectomy.  She is  aware of it and understands the process and consents to proceed.                                   Venita Lick. Russella Dar, MD, Clementeen Graham   MTS/MedQ  DD:  08/02/2006  DT:  08/04/2006  Job #:  [id number]   cc:   Corwin Levins, MD

## 2011-04-01 NOTE — Discharge Summary (Signed)
[place]  Patient:    Priscilla Anderson, Priscilla Anderson Visit Number: [id number] MRN: [id number]          Service Type: MED Location: [facility] 2902 [id number] Attending Physician:  Ruta Hinds Dictated by:   Halford Decamp Delanna Ahmadi, R.N., N.P. Admit Date:  05/21/2002 Discharge Date: 05/26/2002                             Discharge Summary  ADDENDUM  Previously dictated summary was #[id number].  DISCHARGE MEDICATIONS:  1. Lasix 40 mg q.d.  2. Enteric-coated aspirin 81 mg q.d.  3. Premarin 0.625 mg q.d.  4. Zocor 20 mg at bedtime.  5. Fibercon 625 mg at bedtime.  6. Valium 5 to 10 mg b.i.d.  7. Digoxin 0.125 mg q.d.  8. Prinivil 20 mg q.d.  9. Coreg 3.125 mg b.i.d. 10. Actonel 5 mg q.d. 11. Amaryl 10 mg q.d. 12. Potassium 8 mEq q.d.  ACTIVITY:  She is to do no strenuous activity, no lifting.  DIET:  Low sodium diabetic diet.  WOUND CARE:  She can wash her incisions with soap and water and pat it dry.  FOLLOWUP:  She will call our office on Monday to get an appointment to see a physicians assistant in one week, and to see Dr. Alanda Amass in two to three weeks.  She will also get a CBC prior to her appointment and a BMET. Dictated by:   Halford Decamp Delanna Ahmadi, R.N., N.P. Attending Physician:  Ruta Hinds DD:  05/26/02 TD:  05/28/02 Job: [id number]

## 2011-04-01 NOTE — Discharge Summary (Signed)
[place]. [facility]  Patient:    Priscilla Anderson, Priscilla Anderson Visit Number: [id number] MRN: [id number]          Service Type: MED Location: CCUB 2902 01 Attending Physician:  Ruta Hinds Dictated by:   Joseph Art, N.P. Admit Date:  05/21/2002 Discharge Date: 05/26/2002   CC:         Alleen Borne, M.D.  Corwin Levins, M.D. [facility]   Discharge Summary  HISTORY OF PRESENT ILLNESS:  Ms. Priscilla Anderson is a 77 year old African-American female, a patient of Richard A. Alanda Amass, M.D., who is admitted for implantation of a biventricular pacemaker.  She has a prior medical history of nonischemic cardiomyopathy with an EF of 10-20%.  She has had class III CHF.  She has associated diastolic dysfunction and low output syndrome, chronic left bundle branch block.  She does have a history of some coronary artery disease.  She underwent cardiac catheterization in 1996.  She had a 60-70% diagonal, 63% diagonal 2, 70% mid-LAD with a 30% RCA.  At that time she had an EF of 30% and global hypokinesis, felt this to be out of proportion to her coronary artery disease.  HOSPITAL COURSE:  The procedure, performance of an 42, was complex.  At the beginning of the procedure she had the development of intermittent 2:1 heart block and complete heart block with chronic left bundle branch block associated with hypotension and confusion and possibly aborted seizure related to her hypoxia, which was rate-related.  She required temporary transvenous pacing, intubation, and then a Medtronic DDRV was placed.  Post procedure she had sustained a left pneumothorax.  Cardiovascular surgery consult was called.  She was seen by Dr. Laneta Simmers, who placed a left chest tube.  She was able to be extubated on May 22, 2002, post procedure.  She continued to progress.  She did have some transient hypotension episodes, which responded to fluids.  She underwent 2 D echocardiogram on May 23, 2002,  secondary to a chest x-ray that showed enlargement of her cardiac silhouette with a question of pericardial effusion.  The 2 D echo showed she had a small circumferential pericardial effusion posteriorly greater than anteriorly.  RV did appear to have some element of collapse; however, the RA had no diastolic inversion.  She apparently had multiple areas of dyskinesis.  The left ventricle was moderately dilated.  Her EF was ranged from 10-20%.  Her chest tube was discontinued on May 24, 2002.  On May 25, 2002, her chest x-ray showed a slight increase of the left pneumothorax at 5%.  She was up walking in the halls on May 25, 2002.  She had no shortness of breath, no chest discomfort, no dizziness, etc.  Initially after her extubation her medications were started at a decreased dose secondary to her transient hypotension episode. Then on May 25, 2002, she was started on Coreg for her LV dysfunction, for which she continued to have some sinus tachycardia.  Her atenolol had been held during her hospitalization.  This was not restarted.  On May 26, 2002, her blood pressure was 142/82, her pulse was 94, respirations were 20, her temperature was 97.3, her room air saturations were 95%.  She was seen by  Dr. Elsie Lincoln, considered to be stable for discharge.  He did not that she had some persistent leukocytosis throughout her hospitalization.  She had no fever. Her urine was negative for leukocyte esterase.  She did have some bacteria. He felt that she was  okay for discharge to follow up in about one week for her wound site and to follow her CBC.  He thought that she had high segmental neutrophils and she had a reactive leukocytosis.  LABORATORY DATA:  On May 24, 2002, sodium 138, potassium 3.8, glucose was 142, BUN was 10, creatinine 0.9.  On May 21, 2002, the WBC was 16.2, her hemoglobin was 14.5, hematocrit 43.2, platelets were 183.  On May 23, 2002, her WBC was 21.6, her hemoglobin was  12.4, hematocrit was 37.4, platelets 124. CK-MB #1 starting May 21, 2002, CK 189/16.3, no troponin done.  #2, 216/10.2, with a troponin of 0.70.  Total cholesterol was 150, triglycerides were 47, HDL was 81, and LDL was 60.  Urine showed a small amount of hemoglobin, a few bacteria.  Leukocyte esterase was negative, wbcs 0-2, rbcs were 3-6.  Chest x-ray on May 25, 2002, showed a marginal increase in her small left apical pneumothorax, less than 5%.  On May 24, 2002, chest x-ray showed no pneumothorax.  Her EKG showed she was pacing on demand.  DISCHARGE DIAGNOSES: 1. Status post Medtronic biventricular pacer placement on May 21, 2002, with    complications of 2:1 heart block, complete heart block, with hypotension,    hypoxia. 2. Nonischemic cardiomyopathy with an ejection fraction of 10-20% with class    IV heart failure. 3. Chronic left bundle branch block. 4. Adult onset diabetes mellitus, type 2. 5. Hyperlipidemia. 6. Coronary artery disease with cardiac catheterization in 1996 with a 60-70%,    60% diagonal 2, 70% mid-LAD, and 30% RCA. 7. Dyslipidemia. 8. Transient hypotension. 9. Left pneumothorax, resolved with chest tube insertion, marginal increase    in left pneumothorax with chest tube removal on May 24, 2002. 10. Sinus tachycardia. 11. Elevated cardiac enzymes, questionable subendocardial myocardial     infarction, with acute decompensation secondary to arrhythmias and     respiratory hypoxia at initiation of procedure.Dictated by:   Joseph Art, N.P. Attending Physician:  Ruta Hinds DD:  05/26/02 TD:  05/28/02 Job: [id number]

## 2011-04-01 NOTE — H&P (Signed)
NAMEALAYSHIA, MARINI NO.:  [id number]   MEDICAL RECORD NO.:  [id number]                   PATIENT TYPE:  OIB   LOCATION:  2899                                 FACILITY:  [facility]   PHYSICIAN:  Hermelinda Medicus, MD                  DATE OF BIRTH:  Apr 21, 1934   DATE OF ADMISSION:  09/04/2002  DATE OF DISCHARGE:                                HISTORY & PHYSICAL   HISTORY OF PRESENT ILLNESS:  This patient is a 77 year old female who had a  history of having been intubated just recently.  She was on life support  during a difficult pacemaker placement.  However, once the pacemaker was  completed she has done extremely well.  She has had an excellent evaluation  of her medical status.  She underwent a biventricular pacemaker  implantation.  She had developed a heart block and hypotension that required  prophylactic intubation.  She now enters for removal of what appears to be a  vocal cord nodule on the right, maybe a polyp with granulation tissue,  without any evidence of malignancy.  She now enters for a microlaryngoscopy  and two vocal cord biopsy stripping of that area.   PAST MEDICAL HISTORY:  1. Adult onset diabetes mellitus.  2. Hyperlipidemia.  3. Systemic hypertension.  4. She has had the noncritical coronary disease.  5. Remote catheterization.  6. No recent angina.  7. Her last Cardiolite test in 03/05/02 showed dilated left ventricle with an     anteroseptal scar and no significant ischemia.  8. She has been evaluated by Dr. Alanda Amass who feels from a cardiac     standpoint it is okay to go ahead with this.  We feel also this is not     just a vocal cord polyp.  Its size brings the airway into consideration.     If this increased in size it could jeopardize her airway.  Therefore, we     plan to do a microlaryngoscopy, two vocal cord stripping, and removal of     polyp.   CURRENT MEDICATIONS:  1. Actonel 5 mg t.i.d.  2. Amaryl 1 mg q.d.  3.  Potassium 600 mg.  4. Lasix 40 mg.  5. Prinivil 20 mg q.d.  6. Baby aspirin 1 q.d.  7. Lanoxin 0.125 mg.  8. Coreg 6.25 b.i.d.  9. Premarin 0.625 mg.  10.      Zocor 20 mg.  11.      Fibercon 2 tablets in the p.m.  12.      Valium 5 mg b.i.d. p.r.n.  13.      Beconase nasal spray.   ALLERGIES:  No allergies to medications.   REVIEW OF SYSTEMS:  The diabetes is controlled by medications.  She has the  pacemaker inserted.  Hospitalized 7/03.  She had shortness of breath with  congestive heart failure in 7/03.  She also had 1996 a problem with her  heart.  Diabetes mellitus in 1999 diagnosis.  She has frequency due to the  Lasix use.   PAST SURGICAL HISTORY:  1. Partial hysterectomy in the 70s.  2. Cardiac catheterization in 1996 and then in 2003.  3. Pacemaker insertion in 7/03.   PHYSICAL EXAMINATION:  VITAL SIGNS: Blood pressure of 136/80 with a pulse of  68.  Her weight is 121 and she is 5 feet 4 inches.  HEENT: Ears are clear.  Tympanic membranes are clear.  Oral cavity is clear.  Larynx shows a very large granulation type tissue material of a right vocal  cord polyp.  It appears benign but it also appears large enough so that  increase in size could become somewhat of an airway problem.  No  abnormalities of her larynx.  The epiglottis base of tongue.  Lateral  pharyngeal walls appear to be clear.  NECK: Free of any thyromegaly, cervical adenopathy, or mass.  LUNGS: Without rubs, rhonchi, or wheezes.  CARDIOVASCULAR: No murmurs, rubs, or gallops.  Pacemaker is in place.  Her  EKG was with a history of CHF, cardiomyopathy.  Recent pacemaker placement.  ABDOMEN: Unremarkable.  EXTREMITIES: Also unremarkable.   DIAGNOSES:  1. Two vocal cord polyp.  2. Granulation tissue.  3. Rule out malignancy right cord.  4. History of cardiomyopathy with recent pacemaker placement.  5. History of cardiac catheterization in 1996 and 2003.  6. History of partial hysterectomy in  1970s.                                               Hermelinda Medicus, MD    JC/MEDQ  D:  09/04/2002  T:  09/04/2002  Job:  [id number]   cc:   Gerlene Burdock A. Alanda Amass, M.D.  [phone] [address], [address]  [place]  [place] [address]  Fax: [phone]   Corwin Levins, M.D. [facility]

## 2011-04-01 NOTE — Cardiovascular Report (Signed)
[place]. [facility]  Patient:    ABBAGALE, GOGUEN Visit Number: [id number] MRN: [id number]          Service Type: MED Location: CCUA 2930 01 Attending Physician:  Ruta Hinds Dictated by:   Pearletha Furl Alanda Amass, M.D. Proc. Date: 05/21/02 Admit Date:  05/21/2002   CC:         Corwin Levins, M.D. [facility]  Lenise Herald, M.D.   Cardiac Catheterization  PROCEDURE:  Implantation of temporary transvenous pacemaker, selective left coronary angiography by Judkins technique, implantion of biventricular DDDRV pulse generator with active fixation atrial and RV leads, passive fixation LV (coronary sinus) lead, coronary sinus angiography.  IMPLANTING PHYSICIAN:  Richard A. Alanda Amass, M.D.  COMPLICATIONS:  Development of intermittent 2 to 1 heart block and CHB with chronic LBBB associated with hypotension and confusion and possible aborted seizure related to hypoxia - rate-related at start of procedure.  Requiring temporary transvenous pacing, intubation, ventilatory support, and permanent pacemaker implantation.  ANESTHESIA:  5 mg Valium p.o. premediation, 1% local Xylocaine anesthesia, intermittent Nubane total of 4 mg IV, Versed 5 mg IV, fentanyl 75 mg IV, Narcan 0.4 mg.  ESTIMATED BLOOD LOSS:  Approximately 100 cc.  PREOPERATIVE DIAGNOSIS: 1. Nonischemic cardiomyopathy. 2. Catheterization with noncritical coronary artery disease remotely, no    angina. 3. Class IV heart failure, low output symptoms, EF approximately 15% with    associated diastolic dysfunction, left ventricular enlargement, chronic    left bundle branch block. 4. IVCD - LBBB with marked first degree heart block. 5. Intralaboratory 2 to 1 heart block and intermittent CHB. 6. Adult onset diabetes mellitus. 7. Hyperlipidemia.  POSTOPERATIVE DIAGNOSIS: 1. Nonischemic cardiomyopathy. 2. Catheterization with noncritical coronary artery disease remotely, no    angina. 3. Class IV  heart failure, low output symptoms, EF approximately 15% with    associated diastolic dysfunction, left ventricular enlargement, chronic    left bundle branch block. 4. IVCD - LBBB with marked first degree heart block. 5. Intralaboratory 2 to 1 heart block and intermittent CHB. 6. Adult onset diabetes mellitus. 7. Hyperlipidemia.  PULSE GENERATOR:  Medtronic Insync III model [id number] biventricular generator; SN [id number].  ATRIAL ELECTRODE:  Medtronic #5076 - 52 cm; SN [id number].  VENTRICULAR ELECTRODE:  Medtronic #5076 - 58 cm; SN [id number].  Atrial and ventricular electrodes are steroid-eluding Capsurefix active fixation screw-in retractable electrodes IS1 connectors.  Left ventricular electrode (coronary sinus) Medtronic #4193 - 88 cm; SN [id number].  Magnet rate at BOL equals 85, magnet rate at RRT is 65 and reversed to VVI mode.  Bipolar atrial threshold; threshold for capture 0.9 volts, resistance 658 ohms, P waves 2.5 MV.  Right ventricle bipolar; threshold for capture 0.4 V, resistance 704 ohms, R waves 28 MV.  Left ventricular unipolar; threshold 0.3 V, impedence 696 ohms, R waves 4 volts.  DESCRIPTION OF PROCEDURE:  The patient was brought to the second floor CP lab in the postabsorptive state after 5 mg of Valium p.o. premedication. Preoperative laboratory was within normal limits with BUN and creatinine 9/0.9, H&H 13/40, normal TSH, and coags.  Baseline EKG showed left bundle branch block with a rate of 54 and first degree heart block, PR interval 0.218, QRS duration 0.18 milliseconds.  The patient was scheduled for a biventricular pacer for severe low output with nonischemic cardiomyopathy.  Two-dimensional echocardiogram and prior Cardiolite showed EF of 15 to 20%.  The patient was on maximum medical therapy including beta blocker, ACE, diuretics, and digoxin.  Informed consent was  obtained to proceed with this.  The left anterior chest was prepped and  draped in the usual fashion.  1% Xylocaine was used for local anesthesia.  The patient was in sinus rhythm at this time.  Left infraclavicular angled incision was made near the lateral third of the clavicle to try to access the cephalic vein from the superior portion of the incision.  Pulse generator pocket was formed using blunt dissection with electrocautery to control hemostasis.  At this point the patient was noted to go into 2 to 1 heart block.  She had been given 2 mg of Nubane for sedation.  She initially was stable with blood pressure of 140. She, however, gradually became more confused and semiobtunded.  The right groin was previously prepped and it was uncovered at this time.  We were getting ready to put a temporary transvenous pacemaker in.  The patient became slightly more confused.  Narcan was given to reverse the Versed 0.4 mg.  She had an aborted seizure and this was felt to be due to probable low output related to bradycardia.  No vascular entry had been performed at this time. Promptly a temporary transvenous pacemaker was inserted in the RV through the right femoral vein through a 6 French short sidearm sheath.  The artery to maintain arterial blood pressure recording was accessed.  Both with anterior puncture using 18 thin wall needle and a 6 [place] short Daig sidearm sheath was inserted.  With institution of temporary pacing, the patient was further sedated with Versed a total of 3 mg in divided doses and given 50 mg of fentanyl.  She was intubated and placed on a respirator for ventilatory support. There was good position of the tube on fluoroscopy and the patient was stabilized.  I then rescrubed and went back to the left chest position. Initial attempts to fine the cephalic vein revealed a very small unusable cephalic vein.  For this reason, subclavian puncture was then performed in the  mid to lateral third of the clavicular area.  An 18 thin wall needle was  used with the respirator on hold.  Tandem peel-away #9 Cook introducers were used with a stainless steel J-tip guide wire to introduce the active fixation atrial and ventricular electrodes.  The stainless steel guide wire was removed after positioning of the RV electrode in the RV apex and the atrial electrode in the right atrial appendage both of which were stable.  Position was confirmed by fluoroscopy and rotational maneuvers.  The temporary transvenous pacemaker was then pulled back under fluoroscopic control to the IBC area.  A second subclavian puncture was then performed medial to the first and a #9 peel-away Cook introducer was inserted.  An 8 French MB2 Medtronic guiding catheter was inserted over a stainless steel guide wire which was removed along with the dilator.  Initially we were not able to locate the coronary sinus with the MB2 guiding catheter and an MB1 was utilized.  Getting near the ostia of what was felt to be the coronary sinus, the patient probably had significant TR and the guiding catheter was moved back and not stable.  It was felt best to ascertain the position of the coronary sinus since we had arterial access, left coronary angiography was performed in RAO, PA, and lateral projections to locate the coronary sinus which was slightly vertical in orientation.  Coronary angiography showed a moderate size circumflex artery with a normal marginal branch and circumflex proper and PAVG branch.  There  was no significant stenosis noted.  The LAD and diagonals were normal with good visualization up to the midportion of the LAD and the distal portion with no obvious significant disease.  There was moderate proximal LAD calcification present.  Initial coronary sinus visualization showed very small posterolateral branches and a large inferior branch which did have some lateral branches distally so this was a possible access if necessary, but not felt to be primary.  We  were then able to access the coronary sinus with an MB2 guiding catheter initially with telescoping multipurpose 6 French catheter.  The multipurpose entered the coronary sinus with hand injections obtained, but was not stable. We were then able to enter with the MB2 and then a 0.035 inch Wholey wire to stabilize the MB2.  Coronary sinus angiography was performed without balloon occlusion.  There were very small posterolateral branches and it was not initially clear whether they would be large enough for lead access or not.  A 0.014 inch HGF guide wire with a floppy tip was then used through the guiding catheter.  We were able to advance this into the high posterolateral branch fairly distally.  We were then able to advance the coronary sinus #4193 unipolar electrode into the posterolateral branch at about its midportion. The lead appeared quite stable with the guide wire pulled back and there was excellent left ventricular threshold of 0.3 volts.  Adequate R waves of 4 volts were obtained.  There was no diaphragmatic stimulation on the RV or LV lead at 10 volt output on PSA testing.  Electrical separation was 120 milliseconds which was felt to be adequate and there was good positional separation between the posterolateral wall and RV apex electrodes.  The guiding catheter was then pulled back to the ostia.  The guiding catheter was then slit using the Medtronic slitter without movement of the coronary sinus lead under fluoroscopic control.  The peel-away Cook introducer was then removed.  The coronary sinus lead was secured at the insertion site with a single #1 silk suture around a tissue sewing collar to prevent migration and two interrupted #1 silk sutures around a silicon sewing collar.  The stylets were removed from all leads and the atrial and ventricular electrodes were secured with a previously placed #1 figure-of-eight silk suture around a tissue sewing collar at the entrance  site.  Further secured with two #1 silk sutures around a silicon sewing collar for each electrode.  The sponge count was correct.  The pocket was irrigated with 500 mg of Kanamycin solution.  The pocket was dry at this time.  The threshold testing was performed prior to securing the leads again on the RA and RV leads as well as noted above.  The generator was hooked to the coronary sinus, RV, and RA leads in the proper sequence with a single hexnut tightened for each electrode.  The electrodes were looped behind and delivered into the pocket.  The generator was delivered into the pocket and loosely secured to the underlying muscle and fascia with a #1 silk suture to prevent migration.  The subcutaneous tissue was closed with two separate running layers of 2-0 Dexon suture and the skin was closed with 5-0 subcuticular Dexon suture.  Fluoroscopy showed good position of all three electrodes.  There was no pneumothorax present.  The patients blood pressure was 140 on dopamine 5 mcg per kg per minute and nitroglycerin 3 microdrops per minute.  The patient was beginning to wake up on a  ventilator and she was transferred to the CCU for postoperative care in stable hemodynamic condition. Dictated by:   Pearletha Furl Alanda Amass, M.D. Attending Physician:  Ruta Hinds DD:  05/21/02 TD:  05/23/02 Job: [id number]

## 2011-04-01 NOTE — Op Note (Signed)
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   OPERATION:  A microlaryngoscopy and biopsy excision of a right true vocal  cord polyp via endotracheal anesthesia with Dr. Edwin Cap. Zoila Shutter.   SURGEON:  Hermelinda Medicus, M.D.   DESCRIPTION OF PROCEDURE:  The patient after complete evaluation  of her  medical status was placed under general endotracheal anesthesia. The 6.5  tube was used for intubation. The patient had a large right-sided  vocal  cord polyp that measured approximately 1.5 cm in size, and was partially  obstructive in nature, and also was affecting her voice considerably. She  has had this for several months.   Once we had her intubated and then we used the Jako laryngoscope,  and  examined the larynx completely. The true cords were evaluated as was the  piriform post cricoid region, lateral pharyngeal walls, base of tongue,  epiglottis, lingual laryngeal sides, and then the scope was stabilized using  the Louis suspension on the true vocal cords.   With this we saw a large granulation tissue type firm polyp that was sitting  on the posterior aspect just slightly superior to the true vocal cords,  attached to the ventricular  tissue, and this was  carefully excised,  grasping the polyp and then using the microlaryngoscopy scissors and using  the high power microscope which was brought into place.   Looking through this  microscope, we were able to carefully just excise the  area where it was attached to the superior true cord region, and this was  removed in total. There was no evidence of any ventricular invasion or  vocal cord invasion. The true vocal cord otherwise looked quite good. The  left  side, the opposite side, looked to be also in good condition.   Once this was completed, hemostasis was established with a silver nitrate  stick  and the patient tolerated the procedure well. She is doing well  postoperatively.                                               Hermelinda Medicus, MD     JC/MEDQ  D:  09/04/2002  T:  09/04/2002  Job:  [id number]   cc:   Pearletha Furl. Alanda Amass, M.D.  [phone] [address], [address]  [place]  [place] [address]  Fax: [phone]

## 2011-05-16 ENCOUNTER — Encounter: Payer: Self-pay | Admitting: Internal Medicine

## 2011-09-22 ENCOUNTER — Other Ambulatory Visit (INDEPENDENT_AMBULATORY_CARE_PROVIDER_SITE_OTHER): Payer: Medicare Other

## 2011-09-22 ENCOUNTER — Ambulatory Visit (INDEPENDENT_AMBULATORY_CARE_PROVIDER_SITE_OTHER): Payer: Medicare Other | Admitting: Internal Medicine

## 2011-09-22 ENCOUNTER — Encounter: Payer: Self-pay | Admitting: Internal Medicine

## 2011-09-22 VITALS — BP 106/62 | HR 70 | Temp 97.7°F | Ht 64.5 in | Wt 111.2 lb

## 2011-09-22 DIAGNOSIS — I340 Nonrheumatic mitral (valve) insufficiency: Secondary | ICD-10-CM | POA: Insufficient documentation

## 2011-09-22 DIAGNOSIS — I10 Essential (primary) hypertension: Secondary | ICD-10-CM

## 2011-09-22 DIAGNOSIS — E119 Type 2 diabetes mellitus without complications: Secondary | ICD-10-CM

## 2011-09-22 DIAGNOSIS — F3289 Other specified depressive episodes: Secondary | ICD-10-CM

## 2011-09-22 DIAGNOSIS — E785 Hyperlipidemia, unspecified: Secondary | ICD-10-CM

## 2011-09-22 DIAGNOSIS — I420 Dilated cardiomyopathy: Secondary | ICD-10-CM | POA: Insufficient documentation

## 2011-09-22 DIAGNOSIS — Z95810 Presence of automatic (implantable) cardiac defibrillator: Secondary | ICD-10-CM | POA: Insufficient documentation

## 2011-09-22 DIAGNOSIS — F329 Major depressive disorder, single episode, unspecified: Secondary | ICD-10-CM

## 2011-09-22 DIAGNOSIS — I428 Other cardiomyopathies: Secondary | ICD-10-CM

## 2011-09-22 HISTORY — DX: Other cardiomyopathies: I42.8

## 2011-09-22 LAB — BASIC METABOLIC PANEL
CO2: 30 mEq/L (ref 19–32)
Chloride: 105 mEq/L (ref 96–112)
Creatinine, Ser: 1.2 mg/dL (ref 0.4–1.2)
Potassium: 4.8 mEq/L (ref 3.5–5.1)
Sodium: 141 mEq/L (ref 135–145)

## 2011-09-22 LAB — LIPID PANEL
LDL Cholesterol: 54 mg/dL (ref 0–99)
Total CHOL/HDL Ratio: 2
Triglycerides: 72 mg/dL (ref 0.0–149.0)

## 2011-09-22 NOTE — Assessment & Plan Note (Signed)
stable overall by hx and exam, most recent data reviewed with pt, and pt to continue medical treatment as before  Lab Results  Component Value Date   WBC 7.5 04/21/2008   HGB 13.5 04/21/2008   HCT 39.9 04/21/2008   PLT 174 04/21/2008   GLUCOSE 97 09/22/2011   CHOL 121 09/22/2011   TRIG 72.0 09/22/2011   HDL 52.90 09/22/2011   LDLCALC 54 09/22/2011   ALT 16 04/21/2008   AST 22 04/21/2008   NA 141 09/22/2011   K 4.8 09/22/2011   CL 105 09/22/2011   CREATININE 1.2 09/22/2011   BUN 20 09/22/2011   CO2 30 09/22/2011   TSH 1.14 04/21/2008   HGBA1C 6.7* 09/22/2011

## 2011-09-22 NOTE — Progress Notes (Signed)
Subjective:    Patient ID: Priscilla Anderson, female    DOB: November 05, 1934, 77 y.o.   MRN: [id number]  HPI  Here to f/u; overall doing ok,  Pt denies chest pain, increased sob or doe, wheezing, orthopnea, PND, increased LE swelling, palpitations, dizziness or syncope.  Pt denies new neurological symptoms such as new headache, or facial or extremity weakness or numbness   Pt denies polydipsia, polyuria, or low sugar symptoms such as weakness or confusion improved with po intake.  Pt states overall good compliance with meds, trying to follow lower cholesterol, diabetic diet, wt overall stable but little exercise however.  CBG's have been excellent on the one metformin per day 9dwn from 2 last visit), and needs labs forwarded to Dr Alanda Amass in [place].  Has seen card in Sept 2012, has reg f/u every 4 mo, doing well, no ICD shocks, adn remains feeling well and active.  Last echo with mod MR only June 2012.  Denies worsening depressive symptoms, suicidal ideation, or panic, though has ongoing anxiety, not increased recently.  Past Medical History  Diagnosis Date  . ICD (implantable cardiac defibrillator) in place 09/22/2011  . Cardiomyopathy 09/22/2011   Past Surgical History  Procedure Date  . Pacemaker insertion 05/21/2002    s/p re-do pacemaker/defibrillator October 2011- [facility]  . Abdominal hysterectomy   . Left breast tumor resection 1980    reports that she has never smoked. She does not have any smokeless tobacco history on file. She reports that she does not use illicit drugs. Her alcohol history not on file. family history includes Arthritis in her father and mother; Diabetes in her other; Heart disease in her brother; and Prostate cancer in her brother. No Known Allergies Current Outpatient Prescriptions on File Prior to Visit  Medication Sig Dispense Refill  . Ascorbic Acid (VITAMIN C) 500 MG tablet Take 500 mg by mouth daily.        Marland Kitchen aspirin 81 MG tablet Take 81 mg by mouth  daily.        . Calcium Citrate-Vitamin D (CITRACAL/VITAMIN D) 250-200 MG-UNIT TABS Take by mouth daily.        . carvedilol (COREG) 12.5 MG tablet Take 12.5 mg by mouth 2 (two) times daily.        . diazepam (VALIUM) 10 MG tablet Take 10 mg by mouth. 1/2 to 1 by mouth at bedtime as needed       . digoxin (LANOXIN) 0.125 MG tablet Take 125 mcg by mouth daily.        . furosemide (LASIX) 40 MG tablet Take 40 mg by mouth. Take 1 by mouth every other day, alternating with 1 and 1/2 by mouth every other day       . glucose blood (ACCU-CHEK AVIVA) test strip 1 each by Other route 2 (two) times daily. Code 250.02       . ibandronate (BONIVA) 150 MG tablet Take 150 mg by mouth every 30 (thirty) days. Take in the morning with a full glass of water, on an empty stomach, and do not take anything else by mouth or lie down for the next 30 min.       Marland Kitchen losartan (COZAAR) 50 MG tablet Take 50 mg by mouth daily.        . metFORMIN (GLUCOPHAGE) 500 MG tablet Take 1 tablet (500 mg total) by mouth daily with breakfast. Every morning  90 tablet  3  . potassium chloride SA (KLOR-CON M20) 20 MEQ  tablet Take 20 mEq by mouth daily.        . simvastatin (ZOCOR) 20 MG tablet Take 20 mg by mouth at bedtime.        . sitaGLIPtan (JANUVIA) 100 MG tablet Take 100 mg by mouth daily.        Marland Kitchen spironolactone (ALDACTONE) 25 MG tablet Take 25 mg by mouth. 1/2 by mouth once daily       . Wheat Dextrin (BENEFIBER) POWD Take by mouth. 2 tablespoons once daily        Review of Systems Review of Systems  Constitutional: Negative for diaphoresis and unexpected weight change.  HENT: Negative for drooling and tinnitus.   Eyes: Negative for photophobia and visual disturbance.  Respiratory: Negative for choking and stridor.   Gastrointestinal: Negative for vomiting and blood in stool.  Genitourinary: Negative for hematuria and decreased urine volume.  Musculoskeletal: Negative for gait problem.    Objective:   Physical Exam BP 106/62   Pulse 70  Temp(Src) 97.7 F (36.5 C) (Oral)  Ht 5' 4.5" (1.638 m)  Wt 111 lb 4 oz (50.463 kg)  BMI 18.80 kg/m2  SpO2 96% Physical Exam  VS noted Constitutional: Pt appears well-developed and well-nourished.  HENT: Head: Normocephalic.  Right Ear: External ear normal.  Left Ear: External ear normal.  Eyes: Conjunctivae and EOM are normal. Pupils are equal, round, and reactive to light.  Neck: Normal range of motion. Neck supple.  Cardiovascular: Normal rate and regular rhythm.   Pulmonary/Chest: Effort normal and breath sounds normal.  Neurological: Pt is alert. No cranial nerve deficit.  Skin: Skin is warm. No erythema.  Psychiatric: Pt behavior is normal. Thought content normal. not anxious or depressed affect   Assessment & Plan:

## 2011-09-22 NOTE — Patient Instructions (Signed)
Continue all other medications as before Please go to LAB in the Basement for the blood and/or urine tests to be done today Please call the phone number [phone] (the PhoneTree System) for results of testing in 2-3 days;  When calling, simply dial the number, and when prompted enter the MRN number above (the Medical Record Number) and the # key, then the message should start. Please return in 6 months, or sooner if needed 

## 2011-09-22 NOTE — Assessment & Plan Note (Signed)
stable overall by hx and exam, most recent data reviewed with pt, and pt to continue medical treatment as before  Lab Results  Component Value Date   LDLCALC 54 09/22/2011

## 2011-09-22 NOTE — Assessment & Plan Note (Signed)
stable overall by hx and exam, most recent data reviewed with pt - the a1c from June 2012, and pt to continue medical treatment as before, for lab today as well  Lab Results  Component Value Date   HGBA1C 6.7* 09/22/2011

## 2011-09-22 NOTE — Assessment & Plan Note (Signed)
stable overall by hx and exam, most recent data reviewed with pt, and pt to continue medical treatment as before  BP Readings from Last 3 Encounters:  09/22/11 106/62  03/21/11 112/64  11/22/10 100/66

## 2011-09-29 ENCOUNTER — Encounter: Payer: Self-pay | Admitting: Gastroenterology

## 2011-10-03 ENCOUNTER — Encounter: Payer: Self-pay | Admitting: Gastroenterology

## 2011-10-04 ENCOUNTER — Ambulatory Visit (AMBULATORY_SURGERY_CENTER): Payer: Medicare Other | Admitting: *Deleted

## 2011-10-04 VITALS — Ht 64.5 in | Wt 111.0 lb

## 2011-10-04 DIAGNOSIS — Z1211 Encounter for screening for malignant neoplasm of colon: Secondary | ICD-10-CM

## 2011-10-04 MED ORDER — PEG-KCL-NACL-NASULF-NA ASC-C 100 G PO SOLR: ORAL | Status: DC

## 2011-10-18 ENCOUNTER — Ambulatory Visit (AMBULATORY_SURGERY_CENTER): Payer: Medicare Other | Admitting: Gastroenterology

## 2011-10-18 ENCOUNTER — Encounter: Payer: Self-pay | Admitting: Gastroenterology

## 2011-10-18 DIAGNOSIS — Z86010 Personal history of colonic polyps: Secondary | ICD-10-CM

## 2011-10-18 DIAGNOSIS — D126 Benign neoplasm of colon, unspecified: Secondary | ICD-10-CM

## 2011-10-18 DIAGNOSIS — Z1211 Encounter for screening for malignant neoplasm of colon: Secondary | ICD-10-CM

## 2011-10-18 LAB — GLUCOSE, CAPILLARY
Glucose-Capillary: 92 mg/dL (ref 70–99)
Glucose-Capillary: 89 mg/dL (ref 70–99)

## 2011-10-18 MED ORDER — SODIUM CHLORIDE 0.9 % IV SOLN: 500.0000 mL | INTRAVENOUS | Status: DC

## 2011-10-18 NOTE — Patient Instructions (Signed)
Please refer to your blue and neon green sheets for instructions regarding diet and activity for the rest of today.  You may resume your medications as you would normally take them.   Diverticulosis Diverticulosis is a common condition that develops when small pouches (diverticula) form in the wall of the colon. The risk of diverticulosis increases with age. It happens more often in people who eat a low-fiber diet. Most individuals with diverticulosis have no symptoms. Those individuals with symptoms usually experience abdominal pain, constipation, or loose stools (diarrhea). HOME CARE INSTRUCTIONS   Increase the amount of fiber in your diet as directed by your caregiver or dietician. This may reduce symptoms of diverticulosis.   Your caregiver may recommend taking a dietary fiber supplement.   Drink at least 6 to 8 glasses of water each day to prevent constipation.   Try not to strain when you have a bowel movement.   Your caregiver may recommend avoiding nuts and seeds to prevent complications, although this is still an uncertain benefit.   Only take over-the-counter or prescription medicines for pain, discomfort, or fever as directed by your caregiver.  FOODS WITH HIGH FIBER CONTENT INCLUDE:  Fruits. Apple, peach, pear, tangerine, raisins, prunes.   Vegetables. Brussels sprouts, asparagus, broccoli, cabbage, carrot, cauliflower, romaine lettuce, spinach, summer squash, tomato, winter squash, zucchini.   Starchy Vegetables. Baked beans, kidney beans, lima beans, split peas, lentils, potatoes (with skin).   Grains. Whole wheat bread, brown rice, bran flake cereal, plain oatmeal, white rice, shredded wheat, bran muffins.  SEEK IMMEDIATE MEDICAL CARE IF:   You develop increasing pain or severe bloating.   You have an oral temperature above 102 F (38.9 C), not controlled by medicine.   You develop vomiting or bowel movements that are bloody or black.  Document Released: 07/28/2004  Document Revised: 07/13/2011 Document Reviewed: 03/31/2010 Exit[employer] Patient Information 2012 [employer].  Colon Polyps A polyp is extra tissue that grows inside your body. Colon polyps grow in the large intestine. The large intestine, also called the colon, is part of your digestive system. It is a long, hollow tube at the end of your digestive tract where your body makes and stores stool. Most polyps are not dangerous. They are benign. This means they are not cancerous. But over time, some types of polyps can turn into cancer. Polyps that are smaller than a pea are usually not harmful. But larger polyps could someday become or may already be cancerous. To be safe, doctors remove all polyps and test them.  WHO GETS POLYPS? Anyone can get polyps, but certain people are more likely than others. You may have a greater chance of getting polyps if:  You are over 50.   You have had polyps before.   Someone in your family has had polyps.   Someone in your family has had cancer of the large intestine.   Find out if someone in your family has had polyps. You may also be more likely to get polyps if you:   Eat a lot of fatty foods.   Smoke.   Drink alcohol.   Do not exercise.   Eat too much.  SYMPTOMS  Most small polyps do not cause symptoms. People often do not know they have one until their caregiver finds it during a regular checkup or while testing them for something else. Some people do have symptoms like these:  Bleeding from the anus. You might notice blood on your underwear or on toilet paper after   you have had a bowel movement.   Constipation or diarrhea that lasts more than a week.   Blood in the stool. Blood can make stool look black or it can show up as red streaks in the stool.  If you have any of these symptoms, see your caregiver. HOW DOES THE DOCTOR TEST FOR POLYPS? The doctor can use four tests to check for polyps:  Digital rectal exam. The caregiver wears gloves  and checks your rectum (the last part of the large intestine) to see if it feels normal. This test would find polyps only in the rectum. Your caregiver may need to do one of the other tests listed below to find polyps higher up in the intestine.   Barium enema. The caregiver puts a liquid called barium into your rectum before taking x-rays of your large intestine. Barium makes your intestine look white in the pictures. Polyps are dark, so they are easy to see.   Sigmoidoscopy. With this test, the caregiver can see inside your large intestine. A thin flexible tube is placed into your rectum. The device is called a sigmoidoscope, which has a light and a tiny video camera in it. The caregiver uses the sigmoidoscope to look at the last third of your large intestine.   Colonoscopy. This test is like sigmoidoscopy, but the caregiver looks at all of the large intestine. It usually requires sedation. This is the most common method for finding and removing polyps.  TREATMENT   The caregiver will remove the polyp during sigmoidoscopy or colonoscopy. The polyp is then tested for cancer.   If you have had polyps, your caregiver may want you to get tested regularly in the future.  PREVENTION  There is not one sure way to prevent polyps. You might be able to lower your risk of getting them if you:  Eat more fruits and vegetables and less fatty food.   Do not smoke.   Avoid alcohol.   Exercise every day.   Lose weight if you are overweight.   Eating more calcium and folate can also lower your risk of getting polyps. Some foods that are rich in calcium are milk, cheese, and broccoli. Some foods that are rich in folate are chickpeas, kidney beans, and spinach.   Aspirin might help prevent polyps. Studies are under way.  Document Released: 07/27/2004 Document Revised: 07/13/2011 Document Reviewed: 01/02/2008 [employer] Patient Information 2012 [employer]. 

## 2011-10-18 NOTE — Progress Notes (Signed)
Patient did not experience any of the following events: a burn prior to discharge; a fall within the facility; wrong site/side/patient/procedure/implant event; or a hospital transfer or hospital admission upon discharge from the facility. (G8907) Patient did not have preoperative order for IV antibiotic SSI prophylaxis. (G8918)  

## 2011-10-19 ENCOUNTER — Telehealth: Payer: Self-pay | Admitting: *Deleted

## 2011-10-19 NOTE — Telephone Encounter (Signed)

## 2011-10-24 ENCOUNTER — Encounter: Payer: Self-pay | Admitting: Gastroenterology

## 2011-11-02 ENCOUNTER — Other Ambulatory Visit: Payer: Self-pay | Admitting: Internal Medicine

## 2011-11-18 ENCOUNTER — Other Ambulatory Visit: Payer: Self-pay | Admitting: Internal Medicine

## 2011-11-28 ENCOUNTER — Other Ambulatory Visit: Payer: Self-pay | Admitting: Internal Medicine

## 2011-12-19 ENCOUNTER — Encounter: Payer: Self-pay | Admitting: Internal Medicine

## 2011-12-19 ENCOUNTER — Ambulatory Visit (INDEPENDENT_AMBULATORY_CARE_PROVIDER_SITE_OTHER): Payer: Medicare Other | Admitting: Internal Medicine

## 2011-12-19 VITALS — BP 110/60 | HR 85 | Temp 97.2°F | Ht 64.5 in | Wt 111.4 lb

## 2011-12-19 DIAGNOSIS — M79676 Pain in unspecified toe(s): Secondary | ICD-10-CM

## 2011-12-19 DIAGNOSIS — I10 Essential (primary) hypertension: Secondary | ICD-10-CM

## 2011-12-19 DIAGNOSIS — M79609 Pain in unspecified limb: Secondary | ICD-10-CM

## 2011-12-19 DIAGNOSIS — E119 Type 2 diabetes mellitus without complications: Secondary | ICD-10-CM

## 2011-12-19 NOTE — Patient Instructions (Signed)
Please use neosporin and gauze to the left great toe daily for 5-7 days, as this should help with healing Please only wear one pair of socks at home (not 2) as this will help give the toe more room in the shoe Continue all other medications as before Please keep your appointments with your specialists as you have planned - Dr Alanda Amass

## 2011-12-20 ENCOUNTER — Other Ambulatory Visit: Payer: Self-pay

## 2011-12-20 MED ORDER — LANCETS MISC: Status: DC

## 2011-12-20 MED ORDER — GLUCOSE BLOOD VI STRP: ORAL_STRIP | Status: DC

## 2011-12-25 ENCOUNTER — Encounter: Payer: Self-pay | Admitting: Internal Medicine

## 2011-12-25 DIAGNOSIS — M79676 Pain in unspecified toe(s): Secondary | ICD-10-CM | POA: Insufficient documentation

## 2011-12-25 NOTE — Assessment & Plan Note (Signed)
stable overall by hx and exam, most recent data reviewed with pt, and pt to continue medical treatment as before  Lab Results  Component Value Date   HGBA1C 6.7* 09/22/2011

## 2011-12-25 NOTE — Progress Notes (Signed)
Subjective:    Patient ID: Priscilla Anderson, female    DOB: 11-Feb-1934, 78 y.o.   MRN: [id number]  HPI  Here to f/u; overall doing ok,  Pt denies chest pain, increased sob or doe, wheezing, orthopnea, PND, increased LE swelling, palpitations, dizziness or syncope.  Pt denies new neurological symptoms such as new headache, or facial or extremity weakness or numbness   Pt denies polydipsia, polyuria, or low sugar symptoms such as weakness or confusion improved with po intake.  Pt states overall good compliance with meds, trying to follow lower cholesterol, diabetic diet, wt overall stable but little exercise however.  Does also have left great toe mild red, sweling and dark area under the nail;  Wears double socks in the shoes at home due to feeling cold.   Pt denies fever, wt loss, night sweats, loss of appetite, or other constitutional symptoms  Past Medical History  Diagnosis Date  . ICD (implantable cardiac defibrillator) in place 09/22/2011  . Cardiomyopathy 09/22/2011  . CHF (congestive heart failure)   . Diabetes mellitus     type II  . Hyperlipidemia   . Hypertension   . Osteopenia   . Adenomatous colon polyp   . Polyp of larynx    Past Surgical History  Procedure Date  . Pacemaker insertion 05/21/2002    s/p re-do pacemaker/defibrillator October 2011- [facility]  . Abdominal hysterectomy   . Left breast tumor resection 1980  . Colonoscopy     reports that she has never smoked. She has never used smokeless tobacco. She reports that she does not drink alcohol or use illicit drugs. family history includes Arthritis in her father and mother; Diabetes in her other; Heart disease in her brother; and Prostate cancer in her brother. No Known Allergies Current Outpatient Prescriptions on File Prior to Visit  Medication Sig Dispense Refill  . Ascorbic Acid (VITAMIN C) 500 MG tablet Take 500 mg by mouth daily.        Marland Kitchen aspirin 81 MG tablet Take 81 mg by mouth daily.        . Calcium  Citrate-Vitamin D (CITRACAL/VITAMIN D) 250-200 MG-UNIT TABS Take by mouth daily.        . carvedilol (COREG) 12.5 MG tablet Take 12.5 mg by mouth 2 (two) times daily.        . diazepam (VALIUM) 10 MG tablet Take 10 mg by mouth. 1/2 to 1 by mouth at bedtime as needed       . digoxin (LANOXIN) 0.125 MG tablet Take 125 mcg by mouth daily.        . furosemide (LASIX) 40 MG tablet Take 40 mg by mouth. Take 1 by mouth every other day, alternating with 1 and 1/2 by mouth every other day       . ibandronate (BONIVA) 150 MG tablet TAKE 1 TABLET EVERY MONTH  3 tablet  3  . JANUVIA 100 MG tablet TAKE 1 TABLET ONCE DAILY  90 tablet  3  . losartan (COZAAR) 50 MG tablet Take 50 mg by mouth daily.        . metFORMIN (GLUCOPHAGE) 500 MG tablet TAKE 2 TABLETS EVERY MORNING  180 tablet  1  . potassium chloride SA (KLOR-CON M20) 20 MEQ tablet Take 20 mEq by mouth daily.        . simvastatin (ZOCOR) 20 MG tablet Take 20 mg by mouth at bedtime.        Marland Kitchen spironolactone (ALDACTONE) 25 MG tablet Take  25 mg by mouth. 1/2 by mouth once daily       . Wheat Dextrin (BENEFIBER) POWD Take by mouth. 2 tablespoons once daily        Review of Systems Review of Systems  Constitutional: Negative for diaphoresis and unexpected weight change.  HENT: Negative for drooling and tinnitus.   Eyes: Negative for photophobia and visual disturbance.  Respiratory: Negative for choking and stridor.   Gastrointestinal: Negative for vomiting and blood in stool.  Genitourinary: Negative for hematuria and decreased urine volume.      Objective:   Physical Exam BP 110/60  Pulse 85  Temp(Src) 97.2 F (36.2 C) (Oral)  Ht 5' 4.5" (1.638 m)  Wt 111 lb 6 oz (50.519 kg)  BMI 18.82 kg/m2  SpO2 97% Physical Exam  VS noted, not ill appearing Constitutional: Pt appears well-developed and well-nourished.  HENT: Head: Normocephalic.  Right Ear: External ear normal.  Left Ear: External ear normal.  Eyes: Conjunctivae and EOM are normal.  Pupils are equal, round, and reactive to light.  Neck: Normal range of motion. Neck supple.  Cardiovascular: Normal rate and regular rhythm.   Pulmonary/Chest: Effort normal and breath sounds normal.  Neurological: Pt is alert. No cranial nerve deficit.  Skin: Skin is warm. No erythema. left great nail with about 10% area with subungual hematoma, and medial mild irritation/red/swelling Psychiatric: Pt behavior is normal. Thought content normal.     Assessment & Plan:

## 2011-12-25 NOTE — Assessment & Plan Note (Signed)
stable overall by hx and exam, most recent data reviewed with pt, and pt to continue medical treatment as before  BP Readings from Last 3 Encounters:  12/19/11 110/60  10/18/11 129/77  09/22/11 106/62

## 2011-12-25 NOTE — Assessment & Plan Note (Signed)
Mild, with contusion liekly related to wearing double sock in the shoe with tight space,  For neosporin/gauze for a few days, o/w  to f/u any worsening symptoms or concerns

## 2012-02-02 DIAGNOSIS — R5381 Other malaise: Secondary | ICD-10-CM | POA: Diagnosis not present

## 2012-02-02 DIAGNOSIS — R5383 Other fatigue: Secondary | ICD-10-CM | POA: Diagnosis not present

## 2012-02-02 DIAGNOSIS — Z79899 Other long term (current) drug therapy: Secondary | ICD-10-CM | POA: Diagnosis not present

## 2012-02-02 DIAGNOSIS — R0602 Shortness of breath: Secondary | ICD-10-CM | POA: Diagnosis not present

## 2012-02-02 DIAGNOSIS — Z45018 Encounter for adjustment and management of other part of cardiac pacemaker: Secondary | ICD-10-CM | POA: Diagnosis not present

## 2012-02-02 DIAGNOSIS — I5022 Chronic systolic (congestive) heart failure: Secondary | ICD-10-CM | POA: Diagnosis not present

## 2012-02-02 DIAGNOSIS — I2510 Atherosclerotic heart disease of native coronary artery without angina pectoris: Secondary | ICD-10-CM | POA: Diagnosis not present

## 2012-02-20 DIAGNOSIS — Z1231 Encounter for screening mammogram for malignant neoplasm of breast: Secondary | ICD-10-CM | POA: Diagnosis not present

## 2012-02-28 ENCOUNTER — Encounter: Payer: Self-pay | Admitting: Internal Medicine

## 2012-03-22 ENCOUNTER — Telehealth: Payer: Self-pay

## 2012-03-22 ENCOUNTER — Ambulatory Visit (INDEPENDENT_AMBULATORY_CARE_PROVIDER_SITE_OTHER): Payer: Medicare Other | Admitting: Internal Medicine

## 2012-03-22 ENCOUNTER — Other Ambulatory Visit (INDEPENDENT_AMBULATORY_CARE_PROVIDER_SITE_OTHER): Payer: Medicare Other

## 2012-03-22 ENCOUNTER — Encounter: Payer: Self-pay | Admitting: Internal Medicine

## 2012-03-22 VITALS — BP 110/72 | HR 77 | Temp 97.8°F | Ht 64.5 in | Wt 109.0 lb

## 2012-03-22 DIAGNOSIS — E119 Type 2 diabetes mellitus without complications: Secondary | ICD-10-CM

## 2012-03-22 DIAGNOSIS — R82998 Other abnormal findings in urine: Secondary | ICD-10-CM

## 2012-03-22 DIAGNOSIS — E785 Hyperlipidemia, unspecified: Secondary | ICD-10-CM

## 2012-03-22 DIAGNOSIS — M810 Age-related osteoporosis without current pathological fracture: Secondary | ICD-10-CM

## 2012-03-22 DIAGNOSIS — R8290 Unspecified abnormal findings in urine: Secondary | ICD-10-CM

## 2012-03-22 DIAGNOSIS — I10 Essential (primary) hypertension: Secondary | ICD-10-CM | POA: Diagnosis not present

## 2012-03-22 LAB — CBC WITH DIFFERENTIAL/PLATELET
Basophils Relative: 1.8 % (ref 0.0–3.0)
Eosinophils Absolute: 0.2 10*3/uL (ref 0.0–0.7)
Eosinophils Relative: 2.6 % (ref 0.0–5.0)
HCT: 37.3 % (ref 36.0–46.0)
Lymphs Abs: 1.1 10*3/uL (ref 0.7–4.0)
MCHC: 33 g/dL (ref 30.0–36.0)
MCV: 96.3 fl (ref 78.0–100.0)
Monocytes Absolute: 0.5 10*3/uL (ref 0.1–1.0)
Neutro Abs: 4.2 10*3/uL (ref 1.4–7.7)
Neutrophils Relative %: 69.4 % (ref 43.0–77.0)
RBC: 3.87 Mil/uL (ref 3.87–5.11)
WBC: 6.1 10*3/uL (ref 4.5–10.5)

## 2012-03-22 LAB — BASIC METABOLIC PANEL
Chloride: 103 mEq/L (ref 96–112)
Creatinine, Ser: 1.2 mg/dL (ref 0.4–1.2)
GFR: 57.25 mL/min — ABNORMAL LOW (ref >60.00)
Potassium: 4.8 mEq/L (ref 3.5–5.1)

## 2012-03-22 LAB — URINALYSIS, ROUTINE W REFLEX MICROSCOPIC
Bilirubin Urine: NEGATIVE
Hgb urine dipstick: NEGATIVE
Ketones, ur: NEGATIVE
Nitrite: POSITIVE
Total Protein, Urine: NEGATIVE
Urine Glucose: NEGATIVE
pH: 7 (ref 5.0–8.0)

## 2012-03-22 LAB — HEPATIC FUNCTION PANEL
ALT: 13 U/L (ref 0–35)
AST: 22 U/L (ref 0–37)
Bilirubin, Direct: 0 mg/dL (ref 0.0–0.3)
Total Bilirubin: 0.3 mg/dL (ref 0.3–1.2)

## 2012-03-22 LAB — LIPID PANEL
LDL Cholesterol: 39 mg/dL (ref 0–99)
Total CHOL/HDL Ratio: 2
VLDL: 25 mg/dL (ref 0.0–40.0)

## 2012-03-22 NOTE — Patient Instructions (Signed)
Continue all other medications as before Please go to LAB in the Basement for the blood and/or urine tests to be done today You will be contacted by phone if any changes need to be made immediately.  Otherwise, you will receive a letter about your results with an explanation. We will try to send the results to Dr Alanda Amass as well Please return in 6 months, or sooner if needed

## 2012-03-22 NOTE — Telephone Encounter (Signed)
A user error has taken place: encounter opened in error, closed for administrative reasons.

## 2012-03-23 NOTE — Progress Notes (Signed)
Faxed to [facility]

## 2012-03-25 ENCOUNTER — Encounter: Payer: Self-pay | Admitting: Internal Medicine

## 2012-03-25 DIAGNOSIS — R8290 Unspecified abnormal findings in urine: Secondary | ICD-10-CM | POA: Insufficient documentation

## 2012-03-25 NOTE — Progress Notes (Signed)
Subjective:    Patient ID: Priscilla Anderson, female    DOB: April 26, 1934, 78 y.o.   MRN: [id number]  HPI  Here to f/u; overall doing ok,  Pt denies chest pain, increased sob or doe, wheezing, orthopnea, PND, increased LE swelling, palpitations, dizziness or syncope.  Pt denies new neurological symptoms such as new headache, or facial or extremity weakness or numbness   Pt denies polydipsia, polyuria, or low sugar symptoms such as weakness or confusion improved with po intake.  Pt states overall good compliance with meds, trying to follow lower cholesterol, diabetic diet, wt overall stable but little exercise however. No new complaints except has had mild abnormal urine odor, though  Denies urinary symptoms such as dysuria, frequency, urgency,or hematuria. Most recnet dxa 2012 reviewed with pt today as well c/w osteopenia Past Medical History  Diagnosis Date  . ICD (implantable cardiac defibrillator) in place 09/22/2011  . Cardiomyopathy 09/22/2011  . CHF (congestive heart failure)   . Diabetes mellitus     type II  . Hyperlipidemia   . Hypertension   . Osteopenia   . Adenomatous colon polyp   . Polyp of larynx    Past Surgical History  Procedure Date  . Pacemaker insertion 05/21/2002    s/p re-do pacemaker/defibrillator October 2011- [facility]  . Abdominal hysterectomy   . Left breast tumor resection 1980  . Colonoscopy     reports that she has never smoked. She has never used smokeless tobacco. She reports that she does not drink alcohol or use illicit drugs. family history includes Arthritis in her father and mother; Diabetes in her other; Heart disease in her brother; and Prostate cancer in her brother. No Known Allergies Current Outpatient Prescriptions on File Prior to Visit  Medication Sig Dispense Refill  . Ascorbic Acid (VITAMIN C) 500 MG tablet Take 500 mg by mouth daily.        Marland Kitchen aspirin 81 MG tablet Take 81 mg by mouth daily.        . Calcium Citrate-Vitamin D  (CITRACAL/VITAMIN D) 250-200 MG-UNIT TABS Take by mouth daily.        . carvedilol (COREG) 12.5 MG tablet Take 12.5 mg by mouth 2 (two) times daily.        . diazepam (VALIUM) 10 MG tablet Take 10 mg by mouth. 1/2 to 1 by mouth at bedtime as needed       . digoxin (LANOXIN) 0.125 MG tablet Take 125 mcg by mouth daily.        . furosemide (LASIX) 40 MG tablet Take 40 mg by mouth. Take 1 by mouth every other day, alternating with 1 and 1/2 by mouth every other day       . glucose blood (ACCU-CHEK AVIVA) test strip Test two times daily. Code 250.02  100 each  11  . ibandronate (BONIVA) 150 MG tablet TAKE 1 TABLET EVERY MONTH  3 tablet  3  . JANUVIA 100 MG tablet TAKE 1 TABLET ONCE DAILY  90 tablet  3  . Lancets MISC Test as directed two times daily. Code 250.02  100 each  11  . losartan (COZAAR) 50 MG tablet Take 50 mg by mouth daily.        . metFORMIN (GLUCOPHAGE) 500 MG tablet TAKE 2 TABLETS EVERY MORNING  180 tablet  1  . potassium chloride SA (KLOR-CON M20) 20 MEQ tablet Take 20 mEq by mouth daily.        . simvastatin (ZOCOR) 20  MG tablet Take 20 mg by mouth at bedtime.        Marland Kitchen spironolactone (ALDACTONE) 25 MG tablet Take 25 mg by mouth. 1/2 by mouth once daily       . Wheat Dextrin (BENEFIBER) POWD Take by mouth. 2 tablespoons once daily        Review of Systems Review of Systems  Constitutional: Negative for diaphoresis and unexpected weight change.  Eyes: Negative for photophobia and visual disturbance.  Respiratory: Negative for choking and stridor.   Gastrointestinal: Negative for vomiting and blood in stool.  Genitourinary: Negative for hematuria and decreased urine volume.  Musculoskeletal: Negative for gait problem.  Skin: Negative for color change and wound.  Neurological: Negative for tremors and numbness.      Objective:   Physical Exam BP 110/72  Pulse 77  Temp(Src) 97.8 F (36.6 C) (Oral)  Ht 5' 4.5" (1.638 m)  Wt 109 lb (49.442 kg)  BMI 18.42 kg/m2  SpO2  97% Physical Exam  VS noted Constitutional: Pt appears well-developed and well-nourished.  HENT: Head: Normocephalic.  Right Ear: External ear normal.  Left Ear: External ear normal.  Eyes: Conjunctivae and EOM are normal. Pupils are equal, round, and reactive to light.  Neck: Normal range of motion. Neck supple.  Cardiovascular: Normal rate and regular rhythm.   Pulmonary/Chest: Effort normal and breath sounds normal.  Neurological: Pt is alert. Not confused Skin: Skin is warm. No erythema.  Psychiatric: Pt behavior is normal. Thought content normal.     Assessment & Plan:

## 2012-03-25 NOTE — Assessment & Plan Note (Signed)
stable overall by hx and exam, most recent data reviewed with pt, and pt to continue medical treatment as before Lab Results  Component Value Date   LDLCALC 39 03/22/2012    

## 2012-03-25 NOTE — Assessment & Plan Note (Signed)
stable overall by hx and exam, most recent data reviewed with pt, and pt to continue medical treatment as before Lab Results  Component Value Date   HGBA1C 6.5 03/22/2012    

## 2012-03-25 NOTE — Assessment & Plan Note (Signed)
stable overall by hx and exam, most recent data reviewed with pt, and pt to continue medical treatment as before BP Readings from Last 3 Encounters:  03/22/12 110/72  12/19/11 110/60  10/18/11 129/77

## 2012-03-25 NOTE — Assessment & Plan Note (Signed)
For f/u dxa 2014, cont calcium/vit d

## 2012-03-25 NOTE — Assessment & Plan Note (Signed)
?   clincial signficance, exam benign, for UA today

## 2012-03-26 ENCOUNTER — Encounter (INDEPENDENT_AMBULATORY_CARE_PROVIDER_SITE_OTHER): Payer: Medicare Other | Admitting: Ophthalmology

## 2012-03-26 DIAGNOSIS — H2510 Age-related nuclear cataract, unspecified eye: Secondary | ICD-10-CM

## 2012-03-26 DIAGNOSIS — I10 Essential (primary) hypertension: Secondary | ICD-10-CM

## 2012-03-26 DIAGNOSIS — H43819 Vitreous degeneration, unspecified eye: Secondary | ICD-10-CM

## 2012-03-26 DIAGNOSIS — H35039 Hypertensive retinopathy, unspecified eye: Secondary | ICD-10-CM

## 2012-03-26 DIAGNOSIS — E11319 Type 2 diabetes mellitus with unspecified diabetic retinopathy without macular edema: Secondary | ICD-10-CM

## 2012-03-26 DIAGNOSIS — E1165 Type 2 diabetes mellitus with hyperglycemia: Secondary | ICD-10-CM

## 2012-04-07 ENCOUNTER — Other Ambulatory Visit: Payer: Self-pay | Admitting: Internal Medicine

## 2012-05-14 DIAGNOSIS — I5022 Chronic systolic (congestive) heart failure: Secondary | ICD-10-CM | POA: Diagnosis not present

## 2012-05-14 DIAGNOSIS — R0602 Shortness of breath: Secondary | ICD-10-CM | POA: Diagnosis not present

## 2012-05-14 DIAGNOSIS — Z45018 Encounter for adjustment and management of other part of cardiac pacemaker: Secondary | ICD-10-CM | POA: Diagnosis not present

## 2012-05-14 DIAGNOSIS — Z79899 Other long term (current) drug therapy: Secondary | ICD-10-CM | POA: Diagnosis not present

## 2012-05-14 DIAGNOSIS — I059 Rheumatic mitral valve disease, unspecified: Secondary | ICD-10-CM | POA: Diagnosis not present

## 2012-05-14 DIAGNOSIS — E119 Type 2 diabetes mellitus without complications: Secondary | ICD-10-CM | POA: Diagnosis not present

## 2012-06-05 DIAGNOSIS — I509 Heart failure, unspecified: Secondary | ICD-10-CM | POA: Diagnosis not present

## 2012-08-21 DIAGNOSIS — Z23 Encounter for immunization: Secondary | ICD-10-CM | POA: Diagnosis not present

## 2012-09-24 ENCOUNTER — Other Ambulatory Visit (INDEPENDENT_AMBULATORY_CARE_PROVIDER_SITE_OTHER): Payer: Medicare Other

## 2012-09-24 ENCOUNTER — Encounter: Payer: Self-pay | Admitting: Internal Medicine

## 2012-09-24 ENCOUNTER — Ambulatory Visit (INDEPENDENT_AMBULATORY_CARE_PROVIDER_SITE_OTHER): Payer: Medicare Other | Admitting: Internal Medicine

## 2012-09-24 VITALS — BP 106/62 | HR 90 | Temp 97.0°F | Ht 64.5 in | Wt 110.8 lb

## 2012-09-24 DIAGNOSIS — I10 Essential (primary) hypertension: Secondary | ICD-10-CM | POA: Diagnosis not present

## 2012-09-24 DIAGNOSIS — E119 Type 2 diabetes mellitus without complications: Secondary | ICD-10-CM | POA: Diagnosis not present

## 2012-09-24 DIAGNOSIS — E785 Hyperlipidemia, unspecified: Secondary | ICD-10-CM | POA: Diagnosis not present

## 2012-09-24 LAB — LIPID PANEL
Cholesterol: 119 mg/dL (ref 0–200)
HDL: 44 mg/dL (ref >39.00)
LDL Cholesterol: 57 mg/dL (ref 0–99)
Total CHOL/HDL Ratio: 3
Triglycerides: 91 mg/dL (ref 0.0–149.0)
VLDL: 18.2 mg/dL (ref 0.0–40.0)

## 2012-09-24 LAB — BASIC METABOLIC PANEL
BUN: 24 mg/dL — ABNORMAL HIGH (ref 6–23)
CO2: 34 mEq/L — ABNORMAL HIGH (ref 19–32)
Calcium: 9.7 mg/dL (ref 8.4–10.5)
Chloride: 99 mEq/L (ref 96–112)
Creatinine, Ser: 1.3 mg/dL — ABNORMAL HIGH (ref 0.4–1.2)
GFR: 52.05 mL/min — ABNORMAL LOW (ref >60.00)
Glucose, Bld: 104 mg/dL — ABNORMAL HIGH (ref 70–99)
Potassium: 4.1 mEq/L (ref 3.5–5.1)
Sodium: 140 mEq/L (ref 135–145)

## 2012-09-24 LAB — HEMOGLOBIN A1C: Hgb A1c MFr Bld: 6.7 % — ABNORMAL HIGH (ref 4.6–6.5)

## 2012-09-24 NOTE — Assessment & Plan Note (Signed)
stable overall by hx and exam, most recent data reviewed with pt, and pt to continue medical treatment as before Lab Results  Component Value Date   HGBA1C 6.5 03/22/2012

## 2012-09-24 NOTE — Assessment & Plan Note (Signed)
stable overall by hx and exam, most recent data reviewed with pt, and pt to continue medical treatment as before BP Readings from Last 3 Encounters:  09/24/12 106/62  03/22/12 110/72  12/19/11 110/60

## 2012-09-24 NOTE — Progress Notes (Signed)
Subjective:    Patient ID: Priscilla Anderson, female    DOB: Dec 18, 1933, 78 y.o.   MRN: [id number]  HPI   Here to f/u; overall doing ok,  Pt denies chest pain, increased sob or doe, wheezing, orthopnea, PND, increased LE swelling, palpitations, dizziness or syncope.  Pt denies new neurological symptoms such as new headache, or facial or extremity weakness or numbness   Pt denies polydipsia, polyuria, or low sugar symptoms such as weakness or confusion improved with po intake.  Pt states overall good compliance with meds, trying to follow lower cholesterol, diabetic diet, wt overall stable but little exercise however, plans to do more in the future, but for now taking care of ill mother who is 99yo.  CBG's bid in the low 100's.  Last saw cardiology July 2013 and doing well, had BNP and echo, results with Dr Adline Mango. Past Medical History  Diagnosis Date  . ICD (implantable cardiac defibrillator) in place 09/22/2011  . Cardiomyopathy 09/22/2011  . CHF (congestive heart failure)   . Diabetes mellitus     type II  . Hyperlipidemia   . Hypertension   . Osteopenia   . Adenomatous colon polyp   . Polyp of larynx    Past Surgical History  Procedure Date  . Pacemaker insertion 05/21/2002    s/p re-do pacemaker/defibrillator October 2011- [facility]  . Abdominal hysterectomy   . Left breast tumor resection 1980  . Colonoscopy     reports that she has never smoked. She has never used smokeless tobacco. She reports that she does not drink alcohol or use illicit drugs. family history includes Arthritis in her father and mother; Diabetes in her other; Heart disease in her brother; and Prostate cancer in her brother. No Known Allergies Current Outpatient Prescriptions on File Prior to Visit  Medication Sig Dispense Refill  . Ascorbic Acid (VITAMIN C) 500 MG tablet Take 500 mg by mouth daily.        Marland Kitchen aspirin 81 MG tablet Take 81 mg by mouth daily.        . Calcium Citrate-Vitamin D  (CITRACAL/VITAMIN D) 250-200 MG-UNIT TABS Take by mouth daily.        . carvedilol (COREG) 12.5 MG tablet Take 12.5 mg by mouth 2 (two) times daily.        . diazepam (VALIUM) 10 MG tablet Take 10 mg by mouth. 1/2 to 1 by mouth at bedtime as needed       . digoxin (LANOXIN) 0.125 MG tablet Take 125 mcg by mouth daily.        . furosemide (LASIX) 40 MG tablet Take 40 mg by mouth. Take 1 by mouth every other day, alternating with 1 and 1/2 by mouth every other day       . glucose blood (ACCU-CHEK AVIVA) test strip Test two times daily. Code 250.02  100 each  11  . ibandronate (BONIVA) 150 MG tablet TAKE 1 TABLET EVERY MONTH  3 tablet  3  . JANUVIA 100 MG tablet TAKE 1 TABLET ONCE DAILY  90 tablet  3  . Lancets MISC Test as directed two times daily. Code 250.02  100 each  11  . losartan (COZAAR) 50 MG tablet Take 50 mg by mouth daily.        . metFORMIN (GLUCOPHAGE) 500 MG tablet TAKE 2 TABLETS EVERY MORNING  180 tablet  3  . potassium chloride SA (KLOR-CON M20) 20 MEQ tablet Take 20 mEq by mouth daily.        Marland Kitchen  simvastatin (ZOCOR) 20 MG tablet Take 20 mg by mouth at bedtime.        Marland Kitchen spironolactone (ALDACTONE) 25 MG tablet Take 25 mg by mouth. 1/2 by mouth once daily       . Wheat Dextrin (BENEFIBER) POWD Take by mouth. 2 tablespoons once daily        Review of Systems  Constitutional: Negative for diaphoresis and unexpected weight change.  HENT: Negative for tinnitus.   Eyes: Negative for photophobia and visual disturbance.  Respiratory: Negative for choking and stridor.   Gastrointestinal: Negative for vomiting and blood in stool.  Genitourinary: Negative for hematuria and decreased urine volume.  Musculoskeletal: Negative for gait problem.  Skin: Negative for color change and wound.  Neurological: Negative for tremors and numbness.  Psychiatric/Behavioral: Negative for decreased concentration. The patient is not hyperactive.       Objective:   Physical Exam BP 106/62  Pulse 90  Temp  97 F (36.1 C) (Oral)  Ht 5' 4.5" (1.638 m)  Wt 110 lb 12 oz (50.236 kg)  BMI 18.72 kg/m2  SpO2 98% Physical Exam  VS noted Constitutional: Pt appears well-developed and well-nourished.  HENT: Head: Normocephalic.  Right Ear: External ear normal.  Left Ear: External ear normal.  Eyes: Conjunctivae and EOM are normal. Pupils are equal, round, and reactive to light.  Neck: Normal range of motion. Neck supple.  Cardiovascular: Normal rate and regular rhythm.   Pulmonary/Chest: Effort normal and breath sounds normal.  Neurological: Pt is alert. Not confused  Skin: Skin is warm. No erythema.  Psychiatric: Pt behavior is normal. Thought content normal.     Assessment & Plan:

## 2012-09-24 NOTE — Assessment & Plan Note (Signed)
stable overall by hx and exam, most recent data reviewed with pt, and pt to continue medical treatment as before Lab Results  Component Value Date   LDLCALC 39 03/22/2012

## 2012-09-24 NOTE — Patient Instructions (Signed)
Continue all other medications as before Please have the pharmacy call with any refills you may need. Please go to LAB in the Basement for the blood and/or urine tests to be done today You will be contacted by phone if any changes need to be made immediately.  Otherwise, you will receive a letter about your results with an explanation. Please remember to sign up for My Chart at your earliest convenience, as this will be important to you in the future with finding out test results. Please keep your appointments with your specialists as you have planned - Dr Weintraub/cardiology Please return in 6 months, or sooner if needed

## 2012-09-27 ENCOUNTER — Encounter: Payer: Self-pay | Admitting: Internal Medicine

## 2012-10-01 DIAGNOSIS — I2510 Atherosclerotic heart disease of native coronary artery without angina pectoris: Secondary | ICD-10-CM | POA: Diagnosis not present

## 2012-10-01 DIAGNOSIS — I509 Heart failure, unspecified: Secondary | ICD-10-CM | POA: Diagnosis not present

## 2012-10-01 DIAGNOSIS — E119 Type 2 diabetes mellitus without complications: Secondary | ICD-10-CM | POA: Diagnosis not present

## 2012-10-01 DIAGNOSIS — Z4502 Encounter for adjustment and management of automatic implantable cardiac defibrillator: Secondary | ICD-10-CM | POA: Diagnosis not present

## 2012-10-04 ENCOUNTER — Other Ambulatory Visit (HOSPITAL_COMMUNITY): Payer: Self-pay | Admitting: Cardiovascular Disease

## 2012-10-04 DIAGNOSIS — I2510 Atherosclerotic heart disease of native coronary artery without angina pectoris: Secondary | ICD-10-CM

## 2012-10-04 DIAGNOSIS — R079 Chest pain, unspecified: Secondary | ICD-10-CM

## 2012-10-20 ENCOUNTER — Other Ambulatory Visit: Payer: Self-pay | Admitting: Internal Medicine

## 2012-10-23 ENCOUNTER — Ambulatory Visit (HOSPITAL_COMMUNITY)
Admission: RE | Admit: 2012-10-23 | Discharge: 2012-10-23 | Disposition: A | Discharge status: Home / Self Care | Payer: Medicare Other | Source: Ambulatory Visit | Attending: Cardiovascular Disease | Admitting: Cardiovascular Disease

## 2012-10-23 DIAGNOSIS — R079 Chest pain, unspecified: Secondary | ICD-10-CM | POA: Insufficient documentation

## 2012-10-23 DIAGNOSIS — I2510 Atherosclerotic heart disease of native coronary artery without angina pectoris: Secondary | ICD-10-CM | POA: Insufficient documentation

## 2012-10-23 DIAGNOSIS — E119 Type 2 diabetes mellitus without complications: Secondary | ICD-10-CM | POA: Insufficient documentation

## 2012-10-23 DIAGNOSIS — I509 Heart failure, unspecified: Secondary | ICD-10-CM | POA: Diagnosis not present

## 2012-10-23 DIAGNOSIS — I10 Essential (primary) hypertension: Secondary | ICD-10-CM | POA: Insufficient documentation

## 2012-10-23 DIAGNOSIS — E785 Hyperlipidemia, unspecified: Secondary | ICD-10-CM | POA: Insufficient documentation

## 2012-10-23 MED ORDER — REGADENOSON 0.4 MG/5ML IV SOLN
0.4000 mg | Freq: Once | INTRAVENOUS | Status: AC
  Administered 2012-10-23: 0.4 mg via INTRAVENOUS

## 2012-10-23 MED ORDER — TECHNETIUM TC 99M SESTAMIBI GENERIC - CARDIOLITE
25.7000 | Freq: Once | INTRAVENOUS | Status: AC | PRN
  Administered 2012-10-23: 26 via INTRAVENOUS

## 2012-10-23 MED ORDER — TECHNETIUM TC 99M SESTAMIBI GENERIC - CARDIOLITE
8.4000 | Freq: Once | INTRAVENOUS | Status: AC | PRN
  Administered 2012-10-23: 8 via INTRAVENOUS

## 2012-10-23 NOTE — Procedures (Addendum)
[facility] [address] [place] [address] [phone]  Cardiology Nuclear Med Study  Priscilla Anderson is a 78 y.o. female     MRN : [id number]     DOB: Nov 20, 1933  Procedure Date: 10/23/2012  Nuclear Med Background Indication for Stress Test:  Evaluation for Ischemia and Abnormal EKG History:  CAD, CHF Cardiac Risk Factors: Hypertension, Lipids and NIDDM  Symptoms:  none   Nuclear Pre-Procedure Caffeine/Decaff Intake:  1:00am NPO After: 9:00am   IV Site: R Antecubital  IV 0.9% NS with Angio Cath:  22g  Chest Size (in):  n/a IV Started by: Koren Shiver, CNMT  Height: 5\' 4"  (1.626 m)  Cup Size: C  BMI:  Body mass index is 19.05 kg/(m^2). Weight:  111 lb (50.349 kg)   Tech Comments:  n/a    Nuclear Med Study 1 or 2 day study: 1 day  Stress Test Type:  Lexiscan  Order Authorizing Provider:  Susa Griffins, MD   Resting Radionuclide: Technetium 89m Sestamibi  Resting Radionuclide Dose: 8.4 mCi   Stress Radionuclide:  Technetium 70m Sestamibi  Stress Radionuclide Dose: 25.7 mCi           Stress Protocol Rest HR:69 Stress HR: 96  Rest BP: 111/75 Stress BP:113/55  Exercise Time (min): n/a METS: n/a          Dose of Adenosine (mg):  n/a Dose of Lexiscan: 0.4 mg  Dose of Atropine (mg): n/a Dose of Dobutamine: n/a mcg/kg/min (at max HR)  Stress Test Technologist: Ernestene Mention, CCT Nuclear Technologist: Gonzella Lex, CNMT   Rest Procedure:  Myocardial perfusion imaging was performed at rest 45 minutes following the intravenous administration of Technetium 26m Sestamibi. Stress Procedure:  The patient received IV Lexiscan 0.4 mg over 15-seconds.  Technetium 60m Sestamibi injected at 30-seconds.  There were no significant changes with Lexiscan.  Quantitative spect images were obtained after a 45 minute delay.  Transient Ischemic Dilatation (Normal <1.22):  1.18 Lung/Heart Ratio (Normal <0.45):  0.20 QGS EDV:   187 ml QGS ESV:  155 ml LV Ejection Fraction: 17%  Signed by      Rest ECG: AV paced rhythm  Stress ECG: Nondiagnostic secondary to paced rhythm  QPS Raw Data Images:  Normal; no motion artifact; normal heart/lung ratio. Stress Images: LV is dilated. There is decreased uptake in the anterior wall from apex to base. Rest Images:  There is decreased uptake in the anterior wall. Subtraction (SDS):  There is a fixed defect that is most consistent with a previous infarction. There is mild peri-infarct distal antero-septal ischemia.  Impression Exercise Capacity:  Lexiscan with no exercise. BP Response:  Hypotensive blood pressure response. Clinical Symptoms:  Mild fatigue ECG Impression:  Nondiagnostic ECG due to paced rhythm Comparison with Prior Nuclear Study: No images to compare  Overall Impression:  Abnormal stress nuclear study demonstrating a dilated LV with large region of anterior scar with mild peri-infarction mid-distal ischemia.  LV Wall Motion:  Dilated LV with severe global LV dysfunction, EF 17%.   [name], MD  10/23/2012 5:45 PM

## 2012-12-10 ENCOUNTER — Other Ambulatory Visit: Payer: Self-pay | Admitting: Internal Medicine

## 2012-12-10 ENCOUNTER — Telehealth: Payer: Self-pay | Admitting: *Deleted

## 2012-12-10 ENCOUNTER — Ambulatory Visit (INDEPENDENT_AMBULATORY_CARE_PROVIDER_SITE_OTHER): Payer: Medicare Other | Admitting: Internal Medicine

## 2012-12-10 ENCOUNTER — Encounter: Payer: Self-pay | Admitting: Internal Medicine

## 2012-12-10 VITALS — BP 122/76 | HR 82 | Temp 97.6°F | Ht 64.0 in | Wt 111.8 lb

## 2012-12-10 DIAGNOSIS — Z789 Other specified health status: Secondary | ICD-10-CM

## 2012-12-10 DIAGNOSIS — J069 Acute upper respiratory infection, unspecified: Secondary | ICD-10-CM

## 2012-12-10 MED ORDER — AZITHROMYCIN 250 MG PO TABS: ORAL_TABLET | ORAL | Status: DC

## 2012-12-10 NOTE — Telephone Encounter (Signed)
Informed [employer] of NP's advisement. Pt informed to come in on Wednesday to have Digoxin level checked.

## 2012-12-10 NOTE — Progress Notes (Signed)
HPI  Pt presents to the clinic today with c/o cold symptoms x 2 weeks. The worst part is the sore throat and dry cough. She does not produce any sputum. She has not taken anything OTC because she is scared it will interact with her other medicines. She does have a history of allergies but no asthma. She does have sick contacts.  Review of Systems      Past Medical History  Diagnosis Date  . ICD (implantable cardiac defibrillator) in place 09/22/2011  . Cardiomyopathy 09/22/2011  . CHF (congestive heart failure)   . Diabetes mellitus     type II  . Hyperlipidemia   . Hypertension   . Osteopenia   . Adenomatous colon polyp   . Polyp of larynx     Family History  Problem Relation Age of Onset  . Arthritis Mother   . Arthritis Father   . Heart disease Brother     CABG  . Prostate cancer Brother     Prostate problem with up and down PSA  . Diabetes Other     1st degree    History   Social History  . Marital Status: Married    Spouse Name: N/A    Number of Children: 2  . Years of Education: N/A   Occupational History  . retired Counsellor. report clerk    Social History Main Topics  . Smoking status: Never Smoker   . Smokeless tobacco: Never Used  . Alcohol Use: No  . Drug Use: No  . Sexually Active: Not on file   Other Topics Concern  . Not on file   Social History Narrative   Husband now with Multiple Myeloma- see Dr. Claudius Sis    No Known Allergies   Constitutional: Positive headache, fatigue. Denies fever or abrupt weight changes.  HEENT:  Positive sore throat. Denies eye redness, eye pain, pressure behind the eyes, facial pain, nasal congestion, ear pain, ringing in the ears, wax buildup, runny nose or bloody nose. Respiratory: Positive cough. Denies difficulty breathing or shortness of breath.  Cardiovascular: Denies chest pain, chest tightness, palpitations or swelling in the hands or feet.   No other specific complaints in a complete  review of systems (except as listed in HPI above).  Objective:   BP 122/76  Pulse 82  Temp 97.6 F (36.4 C) (Oral)  Ht 5\' 4"  (1.626 m)  Wt 111 lb 12 oz (50.689 kg)  BMI 19.18 kg/m2  SpO2 96% Wt Readings from Last 3 Encounters:  12/10/12 111 lb 12 oz (50.689 kg)  10/23/12 111 lb (50.349 kg)  09/24/12 110 lb 12 oz (50.236 kg)     General: Appears her stated age, well developed, well nourished in NAD. HEENT: Head: normal shape and size; Eyes: sclera white, no icterus, conjunctiva pink, PERRLA and EOMs intact; Ears: Tm's gray and intact, normal light reflex; Nose: mucosa pink and moist, septum midline; Throat/Mouth: + PND. Teeth present, mucosa erythematous and moist, no exudate noted, no lesions or ulcerations noted.  Neck: Mild cervical lymphadenopathy. Neck supple, trachea midline. No massses, lumps or thyromegaly present.  Cardiovascular: Normal rate and rhythm. S1,S2 noted.  No murmur, rubs or gallops noted. No JVD or BLE edema. No carotid bruits noted. Pulmonary/Chest: Normal effort and positive vesicular breath sounds. No respiratory distress. No wheezes, rales or ronchi noted.      Assessment & Plan:   Upper Respiratory Infection, new onset with additional workup required:  Get some rest and drink plenty  of water Do salt water gargles for the sore throat eRx for Azithromax x 5 days OTC Delsym cough syrup  RTC as needed or if symptoms persist.

## 2012-12-10 NOTE — Telephone Encounter (Signed)
Priscilla Anderson, We will monitor her digoxin levels. Have her come in on Wednesday to have it checked. [name]

## 2012-12-10 NOTE — Patient Instructions (Signed)

## 2012-12-10 NOTE — Telephone Encounter (Signed)
Per pharmacist at [facility], there is a drug interaction between Digoxin and Azithromycin. Do you want to change pt's medication or monitor her digoxin levels?

## 2012-12-11 DIAGNOSIS — H6120 Impacted cerumen, unspecified ear: Secondary | ICD-10-CM | POA: Diagnosis not present

## 2012-12-11 DIAGNOSIS — J040 Acute laryngitis: Secondary | ICD-10-CM | POA: Diagnosis not present

## 2012-12-12 ENCOUNTER — Other Ambulatory Visit: Payer: Medicare Other

## 2012-12-12 DIAGNOSIS — T887XXA Unspecified adverse effect of drug or medicament, initial encounter: Secondary | ICD-10-CM | POA: Diagnosis not present

## 2012-12-12 DIAGNOSIS — Z789 Other specified health status: Secondary | ICD-10-CM

## 2012-12-13 LAB — DIGOXIN LEVEL: Digoxin Level: 1.5 ng/mL (ref 0.8–2.0)

## 2013-01-14 DIAGNOSIS — I509 Heart failure, unspecified: Secondary | ICD-10-CM | POA: Diagnosis not present

## 2013-01-14 DIAGNOSIS — I2510 Atherosclerotic heart disease of native coronary artery without angina pectoris: Secondary | ICD-10-CM | POA: Diagnosis not present

## 2013-01-14 DIAGNOSIS — Z4502 Encounter for adjustment and management of automatic implantable cardiac defibrillator: Secondary | ICD-10-CM | POA: Diagnosis not present

## 2013-02-21 DIAGNOSIS — Z1231 Encounter for screening mammogram for malignant neoplasm of breast: Secondary | ICD-10-CM | POA: Diagnosis not present

## 2013-03-10 ENCOUNTER — Other Ambulatory Visit: Payer: Self-pay | Admitting: Internal Medicine

## 2013-03-20 ENCOUNTER — Encounter: Payer: Self-pay | Admitting: Internal Medicine

## 2013-03-25 ENCOUNTER — Encounter: Payer: Self-pay | Admitting: Internal Medicine

## 2013-03-25 ENCOUNTER — Ambulatory Visit (INDEPENDENT_AMBULATORY_CARE_PROVIDER_SITE_OTHER): Payer: Medicare Other

## 2013-03-25 ENCOUNTER — Ambulatory Visit (INDEPENDENT_AMBULATORY_CARE_PROVIDER_SITE_OTHER): Payer: Medicare Other | Admitting: Internal Medicine

## 2013-03-25 VITALS — BP 112/68 | HR 76 | Temp 98.0°F | Ht 64.5 in | Wt 108.5 lb

## 2013-03-25 DIAGNOSIS — E785 Hyperlipidemia, unspecified: Secondary | ICD-10-CM | POA: Diagnosis not present

## 2013-03-25 DIAGNOSIS — D649 Anemia, unspecified: Secondary | ICD-10-CM

## 2013-03-25 DIAGNOSIS — I10 Essential (primary) hypertension: Secondary | ICD-10-CM

## 2013-03-25 DIAGNOSIS — E119 Type 2 diabetes mellitus without complications: Secondary | ICD-10-CM

## 2013-03-25 LAB — CBC WITH DIFFERENTIAL/PLATELET
Eosinophils Relative: 1.9 % (ref 0.0–5.0)
HCT: 35.8 % — ABNORMAL LOW (ref 36.0–46.0)
Hemoglobin: 11.8 g/dL — ABNORMAL LOW (ref 12.0–15.0)
Lymphs Abs: 1 10*3/uL (ref 0.7–4.0)
Monocytes Relative: 6.1 % (ref 3.0–12.0)
Neutro Abs: 5.1 10*3/uL (ref 1.4–7.7)
WBC: 6.7 10*3/uL (ref 4.5–10.5)

## 2013-03-25 LAB — URINALYSIS, ROUTINE W REFLEX MICROSCOPIC
Nitrite: NEGATIVE
Specific Gravity, Urine: 1.02 (ref 1.000–1.030)
Total Protein, Urine: NEGATIVE
Urine Glucose: NEGATIVE
Urobilinogen, UA: 0.2 (ref 0.0–1.0)

## 2013-03-25 LAB — BASIC METABOLIC PANEL
BUN: 23 mg/dL (ref 6–23)
Calcium: 9.5 mg/dL (ref 8.4–10.5)
GFR: 57.66 mL/min — ABNORMAL LOW (ref >60.00)
Glucose, Bld: 124 mg/dL — ABNORMAL HIGH (ref 70–99)

## 2013-03-25 LAB — HEPATIC FUNCTION PANEL: Albumin: 4.1 g/dL (ref 3.5–5.2)

## 2013-03-25 LAB — IBC PANEL: Iron: 68 ug/dL (ref 42–145)

## 2013-03-25 LAB — HEMOGLOBIN A1C: Hgb A1c MFr Bld: 6.9 % — ABNORMAL HIGH (ref 4.6–6.5)

## 2013-03-25 LAB — LIPID PANEL
Cholesterol: 119 mg/dL (ref 0–200)
HDL: 48.6 mg/dL (ref >39.00)
VLDL: 14 mg/dL (ref 0.0–40.0)

## 2013-03-25 NOTE — Progress Notes (Signed)
Subjective:    Patient ID: Priscilla Anderson, female    DOB: [birth year], 79 y.o.   MRN: [id number]  HPI    Here to f/u; overall doing ok,  Pt denies chest pain, increased sob or doe, wheezing, orthopnea, PND, increased LE swelling, palpitations, dizziness or syncope.  Pt denies polydipsia, polyuria, or low sugar symptoms such as weakness or confusion improved with po intake.  Pt denies new neurological symptoms such as new headache, or facial or extremity weakness or numbness.   Pt states overall good compliance with meds, has been trying to follow lower cholesterol, diabetic diet, with wt overall stable,  but little exercise however. No new complaints since last visit. Dig level ok from jan 2014 at 1.5  Also saw Dr Ethlyn Gallery May 3, felt to doing ok per pt, no med changes, cbgs fortunately still in the quite low 100's.  Mother now is 100yo and pt hopes to live that long, has good mentation, lives by herself with a daytime sitter only.  Brother with prostate Ca, staying with pt at home. Has lost 2 lbs with being "on the go" taking care of family and doing her churchwork.  No AICD disharge Past Medical History  Diagnosis Date  . ICD (implantable cardiac defibrillator) in place 09/22/2011  . Cardiomyopathy 09/22/2011  . CHF (congestive heart failure)   . Diabetes mellitus     type II  . Hyperlipidemia   . Hypertension   . Osteopenia   . Adenomatous colon polyp   . Polyp of larynx    Past Surgical History  Procedure Laterality Date  . Pacemaker insertion  05/21/2002    s/p re-do pacemaker/defibrillator October 2011- [facility]  . Abdominal hysterectomy    . Left breast tumor resection  1980  . Colonoscopy      reports that she has never smoked. She has never used smokeless tobacco. She reports that she does not drink alcohol or use illicit drugs. family history includes Arthritis in her father and mother; Diabetes in her other; Heart disease in her brother; and Prostate cancer in her  brother. No Known Allergies Current Outpatient Prescriptions on File Prior to Visit  Medication Sig Dispense Refill  . Ascorbic Acid (VITAMIN C) 500 MG tablet Take 500 mg by mouth daily.        Marland Kitchen aspirin 81 MG tablet Take 81 mg by mouth daily.        . Calcium Citrate-Vitamin D (CITRACAL/VITAMIN D) 250-200 MG-UNIT TABS Take by mouth daily.        . carvedilol (COREG) 12.5 MG tablet Take 12.5 mg by mouth 2 (two) times daily.        . diazepam (VALIUM) 10 MG tablet Take 10 mg by mouth. 1/2 to 1 by mouth at bedtime as needed       . digoxin (LANOXIN) 0.125 MG tablet Take 125 mcg by mouth daily.        . furosemide (LASIX) 40 MG tablet Take 40 mg by mouth. Take 1 by mouth every other day, alternating with 1 and 1/2 by mouth every other day       . glucose blood (ACCU-CHEK AVIVA) test strip Test two times daily. Code 250.02  100 each  11  . ibandronate (BONIVA) 150 MG tablet TAKE 1 TABLET EVERY MONTH  3 tablet  3  . JANUVIA 100 MG tablet TAKE 1 TABLET ONCE DAILY  90 tablet  3  . Lancets MISC Test as directed two times daily.  Code 250.02  100 each  11  . losartan (COZAAR) 50 MG tablet Take 50 mg by mouth daily.        . metFORMIN (GLUCOPHAGE) 500 MG tablet TAKE 2 TABLETS EVERY MORNING  180 tablet  2  . potassium chloride SA (KLOR-CON M20) 20 MEQ tablet Take 20 mEq by mouth daily.        . simvastatin (ZOCOR) 20 MG tablet Take 20 mg by mouth at bedtime.        Marland Kitchen spironolactone (ALDACTONE) 25 MG tablet Take 25 mg by mouth. 1/2 by mouth once daily       . Wheat Dextrin (BENEFIBER) POWD Take by mouth. 2 tablespoons once daily        No current facility-administered medications on file prior to visit.   Review of Systems  Constitutional: Negative for unexpected weight change, or unusual diaphoresis  HENT: Negative for tinnitus.   Eyes: Negative for photophobia and visual disturbance.  Respiratory: Negative for choking and stridor.   Gastrointestinal: Negative for vomiting and blood in stool.   Genitourinary: Negative for hematuria and decreased urine volume.  Musculoskeletal: Negative for acute joint swelling Skin: Negative for color change and wound.  Neurological: Negative for tremors and numbness other than noted  Psychiatric/Behavioral: Negative for decreased concentration or  hyperactivity.       Objective:   Physical Exam BP 112/68  Pulse 76  Temp(Src) 98 F (36.7 C) (Oral)  Ht 5' 4.5" (1.638 m)  Wt 108 lb 8 oz (49.215 kg)  BMI 18.34 kg/m2  SpO2 98% VS noted,  Constitutional: Pt appears well-developed and well-nourished.  HENT: Head: NCAT.  Right Ear: External ear normal.  Left Ear: External ear normal.  Eyes: Conjunctivae and EOM are normal. Pupils are equal, round, and reactive to light.  Neck: Normal range of motion. Neck supple.  Cardiovascular: Normal rate and regular rhythm.   Pulmonary/Chest: Effort normal and breath sounds normal.  Abd:  Soft, NT, non-distended, + BS Neurological: Pt is alert. Not confused  Skin: Skin is warm. No erythema.  Psychiatric: Pt behavior is normal. Thought content normal.     Assessment & Plan:

## 2013-03-25 NOTE — Assessment & Plan Note (Signed)
stable overall by history and exam, recent data reviewed with pt, and pt to continue medical treatment as before,  to f/u any worsening symptoms or concerns  Lab Results  Component Value Date   HGBA1C 6.7* 09/24/2012

## 2013-03-25 NOTE — Assessment & Plan Note (Addendum)
stable overall by history and exam, recent data reviewed with pt, and pt to continue medical treatment as before,  to f/u any worsening symptoms or concerns Lab Results  Component Value Date   LDLCALC 57 09/24/2012

## 2013-03-25 NOTE — Patient Instructions (Addendum)
The current medical regimen is effective;  continue present plan and medications. Please have the pharmacy call with any other refills you may need. Please continue your efforts at being more active, low cholesterol diet, and weight control. You are otherwise up to date with prevention measures today. Please keep your appointments with your specialists as you have planned  - Dr Alanda Amass Please go to the LAB in the Basement (turn left off the elevator) for the tests to be done today You will be contacted by phone if any changes need to be made immediately.  Otherwise, you will receive a letter about your results with an explanation Please remember to sign up for My Chart if you have not done so, as this will be important to you in the future with finding out test results, communicating by private email, and scheduling acute appointments online when needed. Please return in 6 months, or sooner if needed

## 2013-03-25 NOTE — Assessment & Plan Note (Signed)
stable overall by history and exam, recent data reviewed with pt, and pt to continue medical treatment as before,  to f/u any worsening symptoms or concerns BP Readings from Last 3 Encounters:  03/25/13 112/68  12/10/12 122/76  09/24/12 106/62

## 2013-03-29 ENCOUNTER — Ambulatory Visit (INDEPENDENT_AMBULATORY_CARE_PROVIDER_SITE_OTHER): Payer: Medicare Other | Admitting: Ophthalmology

## 2013-03-29 DIAGNOSIS — E11319 Type 2 diabetes mellitus with unspecified diabetic retinopathy without macular edema: Secondary | ICD-10-CM | POA: Diagnosis not present

## 2013-03-29 DIAGNOSIS — I10 Essential (primary) hypertension: Secondary | ICD-10-CM

## 2013-03-29 DIAGNOSIS — E1139 Type 2 diabetes mellitus with other diabetic ophthalmic complication: Secondary | ICD-10-CM | POA: Diagnosis not present

## 2013-03-29 DIAGNOSIS — H35039 Hypertensive retinopathy, unspecified eye: Secondary | ICD-10-CM | POA: Diagnosis not present

## 2013-03-29 DIAGNOSIS — H2510 Age-related nuclear cataract, unspecified eye: Secondary | ICD-10-CM

## 2013-03-29 DIAGNOSIS — H43819 Vitreous degeneration, unspecified eye: Secondary | ICD-10-CM

## 2013-04-15 ENCOUNTER — Other Ambulatory Visit: Payer: Self-pay

## 2013-05-30 ENCOUNTER — Other Ambulatory Visit: Payer: Self-pay | Admitting: *Deleted

## 2013-05-30 DIAGNOSIS — I2510 Atherosclerotic heart disease of native coronary artery without angina pectoris: Secondary | ICD-10-CM | POA: Diagnosis not present

## 2013-05-30 DIAGNOSIS — I509 Heart failure, unspecified: Secondary | ICD-10-CM

## 2013-05-30 DIAGNOSIS — Z4502 Encounter for adjustment and management of automatic implantable cardiac defibrillator: Secondary | ICD-10-CM | POA: Diagnosis not present

## 2013-05-30 DIAGNOSIS — E119 Type 2 diabetes mellitus without complications: Secondary | ICD-10-CM | POA: Diagnosis not present

## 2013-05-31 ENCOUNTER — Encounter: Payer: Self-pay | Admitting: Cardiovascular Disease

## 2013-06-04 ENCOUNTER — Telehealth: Payer: Self-pay | Admitting: Cardiovascular Disease

## 2013-06-10 ENCOUNTER — Ambulatory Visit (HOSPITAL_COMMUNITY)
Admission: RE | Admit: 2013-06-10 | Discharge: 2013-06-10 | Disposition: A | Discharge status: Home / Self Care | Payer: Medicare Other | Source: Ambulatory Visit | Attending: Cardiovascular Disease | Admitting: Cardiovascular Disease

## 2013-06-10 DIAGNOSIS — I2510 Atherosclerotic heart disease of native coronary artery without angina pectoris: Secondary | ICD-10-CM | POA: Diagnosis not present

## 2013-06-10 DIAGNOSIS — E785 Hyperlipidemia, unspecified: Secondary | ICD-10-CM | POA: Insufficient documentation

## 2013-06-10 DIAGNOSIS — E119 Type 2 diabetes mellitus without complications: Secondary | ICD-10-CM | POA: Insufficient documentation

## 2013-06-10 DIAGNOSIS — I509 Heart failure, unspecified: Secondary | ICD-10-CM | POA: Insufficient documentation

## 2013-06-10 DIAGNOSIS — I428 Other cardiomyopathies: Secondary | ICD-10-CM | POA: Insufficient documentation

## 2013-06-10 NOTE — Progress Notes (Signed)
[facility]   2D echo completed 06/10/2013.   Cindy Raford Brissett, RDCS  

## 2013-06-11 ENCOUNTER — Other Ambulatory Visit (HOSPITAL_COMMUNITY): Payer: Medicare Other

## 2013-09-03 ENCOUNTER — Encounter: Payer: Self-pay | Admitting: Internal Medicine

## 2013-09-03 ENCOUNTER — Ambulatory Visit (INDEPENDENT_AMBULATORY_CARE_PROVIDER_SITE_OTHER): Payer: Medicare Other | Admitting: Internal Medicine

## 2013-09-03 VITALS — BP 112/78 | HR 78 | Temp 98.2°F | Ht 64.5 in | Wt 110.5 lb

## 2013-09-03 DIAGNOSIS — Z23 Encounter for immunization: Secondary | ICD-10-CM | POA: Diagnosis not present

## 2013-09-03 DIAGNOSIS — E119 Type 2 diabetes mellitus without complications: Secondary | ICD-10-CM | POA: Diagnosis not present

## 2013-09-03 DIAGNOSIS — J309 Allergic rhinitis, unspecified: Secondary | ICD-10-CM | POA: Diagnosis not present

## 2013-09-03 DIAGNOSIS — I10 Essential (primary) hypertension: Secondary | ICD-10-CM

## 2013-09-03 DIAGNOSIS — J019 Acute sinusitis, unspecified: Secondary | ICD-10-CM | POA: Diagnosis not present

## 2013-09-03 MED ORDER — CEPHALEXIN 500 MG PO CAPS: 500.0000 mg | ORAL_CAPSULE | Freq: Four times a day (QID) | ORAL | Status: DC

## 2013-09-03 NOTE — Assessment & Plan Note (Signed)
Mild to mod, for antibx course,  to f/u any worsening symptoms or concerns 

## 2013-09-03 NOTE — Assessment & Plan Note (Signed)
stable overall by history and exam, recent data reviewed with pt, and pt to continue medical treatment as before,  to f/u any worsening symptoms or concerns BP Readings from Last 3 Encounters:  09/03/13 112/78  03/25/13 112/68  12/10/12 122/76

## 2013-09-03 NOTE — Progress Notes (Signed)
Subjective:    Patient ID: Priscilla Anderson, female    DOB: November 26, 1933, 79 y.o.   MRN: [id number]  HPI   Here with 2-3 days acute onset fever, facial pain, pressure, headache, general weakness and malaise, and greenish d/c, with mild ST and cough, but pt denies chest pain, wheezing, increased sob or doe, orthopnea, PND, increased LE swelling, palpitations, dizziness or syncope.   Pt denies polydipsia, polyuria, or low sugar symptoms such as weakness or confusion improved with po intake.  Pt states overall good compliance with meds, trying to follow lower cholesterol, diabetic diet, wt overall stable but little exercise however.    Pt denies new neurological symptoms such as new headache, or facial or extremity weakness or numbness. Her former cardiologist Dr Alanda Amass has retired sept 2014, will see new cards soon.  Allergy meds not working. Past Medical History  Diagnosis Date  . ICD (implantable cardiac defibrillator) in place 09/22/2011  . Cardiomyopathy 09/22/2011  . CHF (congestive heart failure)   . Diabetes mellitus     type II  . Hyperlipidemia   . Hypertension   . Osteopenia   . Adenomatous colon polyp   . Polyp of larynx    Past Surgical History  Procedure Laterality Date  . Pacemaker insertion  05/21/2002    s/p re-do pacemaker/defibrillator October 2011- [facility]  . Abdominal hysterectomy    . Left breast tumor resection  1980  . Colonoscopy      reports that she has never smoked. She has never used smokeless tobacco. She reports that she does not drink alcohol or use illicit drugs. family history includes Arthritis in her father and mother; Diabetes in her other; Heart disease in her brother; Prostate cancer in her brother. No Known Allergies Current Outpatient Prescriptions on File Prior to Visit  Medication Sig Dispense Refill  . Ascorbic Acid (VITAMIN C) 500 MG tablet Take 500 mg by mouth daily.        Marland Kitchen aspirin 81 MG tablet Take 81 mg by mouth daily.        .  Calcium Citrate-Vitamin D (CITRACAL/VITAMIN D) 250-200 MG-UNIT TABS Take by mouth daily.        . carvedilol (COREG) 12.5 MG tablet Take 12.5 mg by mouth 2 (two) times daily.        . diazepam (VALIUM) 10 MG tablet Take 10 mg by mouth. 1/2 to 1 by mouth at bedtime as needed       . digoxin (LANOXIN) 0.125 MG tablet Take 125 mcg by mouth daily.        . furosemide (LASIX) 40 MG tablet Take 40 mg by mouth. Take 1 by mouth every other day, alternating with 1 and 1/2 by mouth every other day       . glucose blood (ACCU-CHEK AVIVA) test strip Test two times daily. Code 250.02  100 each  11  . ibandronate (BONIVA) 150 MG tablet TAKE 1 TABLET EVERY MONTH  3 tablet  3  . JANUVIA 100 MG tablet TAKE 1 TABLET ONCE DAILY  90 tablet  3  . Lancets MISC Test as directed two times daily. Code 250.02  100 each  11  . losartan (COZAAR) 50 MG tablet Take 50 mg by mouth daily.        . metFORMIN (GLUCOPHAGE) 500 MG tablet TAKE 2 TABLETS EVERY MORNING  180 tablet  2  . potassium chloride SA (KLOR-CON M20) 20 MEQ tablet Take 20 mEq by mouth daily.        Marland Kitchen  simvastatin (ZOCOR) 20 MG tablet Take 20 mg by mouth at bedtime.        Marland Kitchen spironolactone (ALDACTONE) 25 MG tablet Take 25 mg by mouth. 1/2 by mouth once daily       . Wheat Dextrin (BENEFIBER) POWD Take by mouth. 2 tablespoons once daily        No current facility-administered medications on file prior to visit.   Review of Systems  Constitutional: Negative for unexpected weight change, or unusual diaphoresis  HENT: Negative for tinnitus.   Eyes: Negative for photophobia and visual disturbance.  Respiratory: Negative for choking and stridor.   Gastrointestinal: Negative for vomiting and blood in stool.  Genitourinary: Negative for hematuria and decreased urine volume.  Musculoskeletal: Negative for acute joint swelling Skin: Negative for color change and wound.  Neurological: Negative for tremors and numbness other than noted  Psychiatric/Behavioral:  Negative for decreased concentration or  hyperactivity.       Objective:   Physical Exam BP 112/78  Pulse 78  Temp(Src) 98.2 F (36.8 C) (Oral)  Ht 5' 4.5" (1.638 m)  Wt 110 lb 8 oz (50.122 kg)  BMI 18.68 kg/m2  SpO2 97% VS noted,  Constitutional: Pt appears well-developed and well-nourished.  HENT: Head: NCAT.  Right Ear: External ear normal.  Left Ear: External ear normal.  Eyes: Conjunctivae and EOM are normal. Pupils are equal, round, and reactive to light.  Bilat tm's with mild erythema.  Max sinus areas mild tender.  Pharynx with mild erythema, no exudate Neck: Normal range of motion. Neck supple.  Cardiovascular: Normal rate and regular rhythm.   Pulmonary/Chest: Effort normal and breath sounds normal.  Neurological: Pt is alert. Not confused  Skin: Skin is warm. No erythema.  Psychiatric: Pt behavior is normal. Thought content normal.     Assessment & Plan:

## 2013-09-03 NOTE — Assessment & Plan Note (Signed)
stable overall by history and exam, recent data reviewed with pt, and pt to continue medical treatment as before,  to f/u any worsening symptoms or concerns Lab Results  Component Value Date   HGBA1C 6.9* 03/25/2013

## 2013-09-03 NOTE — Patient Instructions (Signed)
Please take all new medication as prescribed - the antibiotic Please continue all other medications as before, and refills have been done if requested. You can also take Delsym OTC for cough, and/or Claritin (or it's generic off brand) for congestion, and tylenol as needed for pain. Please go to the LAB in the Basement (turn left off the elevator) for the tests to be done tomorrow You will be contacted by phone if any changes need to be made immediately.  Otherwise, you will receive a letter about your results with an explanation, but please check with MyChart first.  Please remember to sign up for My Chart if you have not done so, as this will be important to you in the future with finding out test results, communicating by private email, and scheduling acute appointments online when needed.  Please keep your appointments with your specialists as you have planned - your new cardiologist  Your November appt can be canceled, and then return in 6 months (the office will call)

## 2013-09-03 NOTE — Assessment & Plan Note (Signed)
Ok for cont'd allergy meds prn

## 2013-09-04 ENCOUNTER — Other Ambulatory Visit (INDEPENDENT_AMBULATORY_CARE_PROVIDER_SITE_OTHER): Payer: Medicare Other

## 2013-09-04 ENCOUNTER — Encounter: Payer: Self-pay | Admitting: Internal Medicine

## 2013-09-04 DIAGNOSIS — E119 Type 2 diabetes mellitus without complications: Secondary | ICD-10-CM | POA: Diagnosis not present

## 2013-09-04 LAB — BASIC METABOLIC PANEL
BUN: 24 mg/dL — ABNORMAL HIGH (ref 6–23)
Calcium: 9.5 mg/dL (ref 8.4–10.5)
Creatinine, Ser: 1.3 mg/dL — ABNORMAL HIGH (ref 0.4–1.2)
GFR: 52.39 mL/min — ABNORMAL LOW (ref >60.00)
Potassium: 4.1 mEq/L (ref 3.5–5.1)

## 2013-09-04 LAB — HEPATIC FUNCTION PANEL
Albumin: 4.1 g/dL (ref 3.5–5.2)
Total Bilirubin: 0.6 mg/dL (ref 0.3–1.2)

## 2013-09-04 LAB — LIPID PANEL
Cholesterol: 122 mg/dL (ref 0–200)
HDL: 44.3 mg/dL (ref >39.00)
LDL Cholesterol: 62 mg/dL (ref 0–99)
VLDL: 15.4 mg/dL (ref 0.0–40.0)

## 2013-09-26 ENCOUNTER — Other Ambulatory Visit: Payer: Self-pay | Admitting: Internal Medicine

## 2013-09-27 ENCOUNTER — Encounter: Payer: Self-pay | Admitting: *Deleted

## 2013-09-30 ENCOUNTER — Ambulatory Visit: Payer: Medicare Other | Admitting: *Deleted

## 2013-09-30 ENCOUNTER — Ambulatory Visit: Payer: Medicare Other | Admitting: Internal Medicine

## 2013-09-30 ENCOUNTER — Encounter: Payer: Self-pay | Admitting: Cardiovascular Disease

## 2013-09-30 ENCOUNTER — Ambulatory Visit (INDEPENDENT_AMBULATORY_CARE_PROVIDER_SITE_OTHER): Payer: Medicare Other | Admitting: Cardiovascular Disease

## 2013-09-30 VITALS — BP 122/64 | HR 89 | Resp 16 | Ht 64.5 in | Wt 111.6 lb

## 2013-09-30 DIAGNOSIS — I428 Other cardiomyopathies: Secondary | ICD-10-CM

## 2013-09-30 DIAGNOSIS — I340 Nonrheumatic mitral (valve) insufficiency: Secondary | ICD-10-CM

## 2013-09-30 DIAGNOSIS — I059 Rheumatic mitral valve disease, unspecified: Secondary | ICD-10-CM | POA: Diagnosis not present

## 2013-09-30 DIAGNOSIS — I509 Heart failure, unspecified: Secondary | ICD-10-CM

## 2013-09-30 DIAGNOSIS — I2510 Atherosclerotic heart disease of native coronary artery without angina pectoris: Secondary | ICD-10-CM

## 2013-09-30 DIAGNOSIS — I420 Dilated cardiomyopathy: Secondary | ICD-10-CM

## 2013-09-30 DIAGNOSIS — E119 Type 2 diabetes mellitus without complications: Secondary | ICD-10-CM

## 2013-09-30 DIAGNOSIS — E785 Hyperlipidemia, unspecified: Secondary | ICD-10-CM

## 2013-09-30 DIAGNOSIS — I5022 Chronic systolic (congestive) heart failure: Secondary | ICD-10-CM | POA: Diagnosis not present

## 2013-09-30 LAB — ICD DEVICE OBSERVATION

## 2013-09-30 MED ORDER — SPIRONOLACTONE 25 MG PO TABS: 12.5000 mg | ORAL_TABLET | Freq: Every day | ORAL | Status: DC

## 2013-09-30 MED ORDER — DIGOXIN 125 MCG PO TABS: 125.0000 ug | ORAL_TABLET | Freq: Every day | ORAL | Status: DC

## 2013-09-30 MED ORDER — LOSARTAN POTASSIUM 50 MG PO TABS: 50.0000 mg | ORAL_TABLET | Freq: Every day | ORAL | Status: DC

## 2013-09-30 MED ORDER — POTASSIUM CHLORIDE CRYS ER 20 MEQ PO TBCR: 20.0000 meq | EXTENDED_RELEASE_TABLET | Freq: Every day | ORAL | Status: DC

## 2013-09-30 MED ORDER — CARVEDILOL 12.5 MG PO TABS: 12.5000 mg | ORAL_TABLET | Freq: Two times a day (BID) | ORAL | Status: DC

## 2013-09-30 MED ORDER — SIMVASTATIN 20 MG PO TABS: 20.0000 mg | ORAL_TABLET | Freq: Every day | ORAL | Status: DC

## 2013-09-30 MED ORDER — FUROSEMIDE 40 MG PO TABS: ORAL_TABLET | ORAL | Status: DC

## 2013-09-30 NOTE — Patient Instructions (Signed)
Remote monitoring is used to monitor your ICD from home. This monitoring reduces the number of office visits required to check your device to one time per year. It allows [place] to keep an eye on the functioning of your device to ensure it is working properly. You are scheduled for a device check from home on 01-02-2014. You may send your transmission at any time that day. If you have a wireless device, the transmission will be sent automatically. After your physician reviews your transmission, you will receive a postcard with your next transmission date.  Your physician recommends that you schedule a follow-up appointment in: 6 months

## 2013-09-30 NOTE — Assessment & Plan Note (Signed)
Diabetes is well controlled with a hemoglobin A1c of 6.9% oral antidiabetic. It is surprising that she has diabetes considering how slender she is.

## 2013-09-30 NOTE — Assessment & Plan Note (Signed)
Satisfactory lipid profile on current regimen 

## 2013-09-30 NOTE — Progress Notes (Signed)
Patient ID: Priscilla Anderson, female   DOB: 09/05/1934, 79 y.o.   MRN: [id number]      Reason for office visit CHF, nonischemic cardiomyopathy, biventricular defibrillator followup  Priscilla Anderson is a delightful 79 year old former patient of Dr. Rocco Serene. This is my first encounter with her. She has a long-standing history of nonischemic cardiomyopathy. She has complete heart block. Her initial pacemaker was placed in 2003, upgraded to a biventricular ICD with a transvenous left ventricular lead in 2007 and again revised with placement of a new epicardial LV lead in 2011. She has a Health visitor that is under advisory. She has known occlusion of left subclavian vein. For this reason her old RV pacemaker lead was used for pace-sense features, the Sprint Fidel[employer] lead was left in place for tachycardia therapies.  She has responded very well to cardiac resynchronization therapy. She has well compensated heart failure (NYHA functional class I) despite a very low left ventricular ejection fraction, most recently estimated to be 25-30% by an echocardiogram in July of this year. She has moderate eccentric mitral insufficiency that is felt to be most likely secondary to cardiomyopathy. Coronary angiography in 2006 showed scattered mild to moderate coronary stenoses. Her nuclear stress test in December of 2013 was interpreted as showing a large area of anterior scar and this has been seen on nuclear stress test performed as far back as 2004.  She feels well.  No Known Allergies  Current Outpatient Prescriptions  Medication Sig Dispense Refill  . Ascorbic Acid (VITAMIN C) 500 MG tablet Take 500 mg by mouth daily.        Marland Kitchen aspirin 81 MG tablet Take 81 mg by mouth daily.        . Calcium Citrate-Vitamin D (CITRACAL/VITAMIN D) 250-200 MG-UNIT TABS Take by mouth daily.        . carvedilol (COREG) 12.5 MG tablet Take 1 tablet (12.5 mg total) by mouth 2 (two) times daily.  180 tablet   3  . diazepam (VALIUM) 10 MG tablet Take 10 mg by mouth. 1/2 to 1 by mouth at bedtime as needed       . digoxin (LANOXIN) 0.125 MG tablet Take 1 tablet (125 mcg total) by mouth daily.  90 tablet  3  . furosemide (LASIX) 40 MG tablet Take 1 tablet by mouth daily & alternate with 1.5 tablets by mouth  180 tablet  3  . glucose blood (ACCU-CHEK AVIVA) test strip Test two times daily. Code 250.02  100 each  11  . ibandronate (BONIVA) 150 MG tablet TAKE 1 TABLET EVERY MONTH  3 tablet  3  . JANUVIA 100 MG tablet TAKE 1 TABLET DAILY  90 tablet  3  . Lancets MISC Test as directed two times daily. Code 250.02  100 each  11  . losartan (COZAAR) 50 MG tablet Take 1 tablet (50 mg total) by mouth daily.  90 tablet  3  . metFORMIN (GLUCOPHAGE) 500 MG tablet take 1 tablet one time daily      . potassium chloride SA (KLOR-CON M20) 20 MEQ tablet Take 1 tablet (20 mEq total) by mouth daily.  90 tablet  3  . simvastatin (ZOCOR) 20 MG tablet Take 1 tablet (20 mg total) by mouth at bedtime.  90 tablet  3  . spironolactone (ALDACTONE) 25 MG tablet Take 0.5 tablets (12.5 mg total) by mouth daily.  45 tablet  3  . Wheat Dextrin (BENEFIBER) POWD Take by mouth. 2 tablespoons once  daily        No current facility-administered medications for this visit.    Past Medical History  Diagnosis Date  . ICD (implantable cardiac defibrillator) in place 09/22/2011  . Nonischemic cardiomyopathy 09/22/2011  . CHF (congestive heart failure)   . Diabetes mellitus     type II  . Hyperlipidemia   . Hypertension   . Osteopenia   . Adenomatous colon polyp   . Polyp of larynx   . CAD (coronary artery disease)     Past Surgical History  Procedure Laterality Date  . Cardiac defibrillator placement  05/21/2002    s/p re-do pacemaker/defibrillator October 2011- [facility]  . Abdominal hysterectomy    . Left breast tumor resection  1980  . Colonoscopy    . Implantable cardioverter defibrillator generator change  09/01/2010     [employer]  . Cardiac catheterization  07/28/2005    noncritical CAD    Family History  Problem Relation Age of Onset  . Arthritis Mother   . Arthritis Father   . Heart disease Brother     CABG  . Prostate cancer Brother     Prostate problem with up and down PSA  . Diabetes Other     1st degree    History   Social History  . Marital Status: Married    Spouse Name: N/A    Number of Children: 2  . Years of Education: N/A   Occupational History  . retired Counsellor. report clerk    Social History Main Topics  . Smoking status: Never Smoker   . Smokeless tobacco: Never Used  . Alcohol Use: No  . Drug Use: No  . Sexual Activity: Not on file   Other Topics Concern  . Not on file   Social History Narrative   Husband now with Multiple Myeloma- see Dr. Claudius Sis    Review of systems: The patient specifically denies any chest pain at rest or with exertion, dyspnea at rest or with exertion, orthopnea, paroxysmal nocturnal dyspnea, syncope, palpitations, focal neurological deficits, intermittent claudication, lower extremity edema, unexplained weight gain, cough, hemoptysis or wheezing.  The patient also denies abdominal pain, nausea, vomiting, dysphagia, diarrhea, constipation, polyuria, polydipsia, dysuria, hematuria, frequency, urgency, abnormal bleeding or bruising, fever, chills, unexpected weight changes, mood swings, change in skin or hair texture, change in voice quality, auditory or visual problems, allergic reactions or rashes, new musculoskeletal complaints other than usual "aches and pains".   PHYSICAL EXAM BP 122/64  Pulse 89  Resp 16  Ht 5' 4.5" (1.638 m)  Wt 111 lb 9.6 oz (50.621 kg)  BMI 18.87 kg/m2  General: Alert, oriented x3, no distress Head: no evidence of trauma, PERRL, EOMI, no exophtalmos or lid lag, no myxedema, no xanthelasma; normal ears, nose and oropharynx Neck: normal jugular venous pulsations and no hepatojugular reflux;  brisk carotid pulses without delay and no carotid bruits Chest: clear to auscultation, no signs of consolidation by percussion or palpation, normal fremitus, symmetrical and full respiratory excursions; the left subclavian ICD scar is healthy, the device has slipped into her breast. Cardiovascular: Inferolaterally displaced and heaving apical impulse, regular  rhythm, normal first and paradoxically split  second heart sounds,  grade 1-2/6 whole systolic apical murmur ,  No rubs or gallops Abdomen: no tenderness or distention, no masses by palpation, no abnormal pulsatility or arterial bruits, normal bowel sounds, no hepatosplenomegaly Extremities: no clubbing, cyanosis or edema; 2+ radial, ulnar and brachial pulses bilaterally; 2+ right femoral,  posterior tibial and dorsalis pedis pulses; 2+ left femoral, posterior tibial and dorsalis pedis pulses; no subclavian or femoral bruits Neurological: grossly nonfocal   EKG:  atrial sensed biventricular paced   Lipid Panel     Component Value Date/Time   CHOL 122 09/04/2013 1118   TRIG 77.0 09/04/2013 1118   HDL 44.30 09/04/2013 1118   CHOLHDL 3 09/04/2013 1118   VLDL 15.4 09/04/2013 1118   LDLCALC 62 09/04/2013 1118    BMET    Component Value Date/Time   NA 139 09/04/2013 1118   K 4.1 09/04/2013 1118   CL 99 09/04/2013 1118   CO2 31 09/04/2013 1118   GLUCOSE 152* 09/04/2013 1118   BUN 24* 09/04/2013 1118   CREATININE 1.3* 09/04/2013 1118   CALCIUM 9.5 09/04/2013 1118   GFRNONAA 59.30 03/19/2010 1046   GFRAA 70 10/20/2008 0852     ASSESSMENT AND PLAN Chronic systolic congestive heart failure, NYHA class 1 She is clinically euvolemic and has excellent functional status. She is on appropriate treatment with relatively high doses of carvedilol, angiotensin II receptor blocker and aldosterone antagonist. She requires a relatively low dose of loop diuretic. No changes are made to her current medications.  Nonischemic dilated  cardiomyopathy Severely depressed left ventricular systolic function with an ejection fraction around 30%. Despite the fact that she has some coronary atherosclerosis B. cardiomyopathy is clearly out of proportion with the degree of coronary disease. She is a CRT-D responder.  Moderate mitral regurgitation by prior echocardiogram This appears to be secondary to the cardiomyopathy.  Biventricular ICD (implantable cardioverter-defibrillator) in place She had an excellent clinical response to cardiac resynchronization therapy. She has almost 100% biventricular pacing efficiency. She has not had recent episodes of heart failure exacerbation or major ventricular arrhythmia. I think she is an excellent candidate for remote monitoring via the care link system. The use of this was demonstrated to the patient today.  HYPERLIPIDEMIA Satisfactory lipid profile on current regimen  DIABETES MELLITUS, TYPE II Diabetes is well controlled with a hemoglobin A1c of 6.9% oral antidiabetic. It is surprising that she has diabetes considering how slender she is.  Orders Placed This Encounter  Procedures  . EKG 12-Lead   Meds ordered this encounter  Medications  . metFORMIN (GLUCOPHAGE) 500 MG tablet    Sig: take 1 tablet one time daily  . furosemide (LASIX) 40 MG tablet    Sig: Take 1 tablet by mouth daily & alternate with 1.5 tablets by mouth    Dispense:  180 tablet    Refill:  3  . spironolactone (ALDACTONE) 25 MG tablet    Sig: Take 0.5 tablets (12.5 mg total) by mouth daily.    Dispense:  45 tablet    Refill:  3  . digoxin (LANOXIN) 0.125 MG tablet    Sig: Take 1 tablet (125 mcg total) by mouth daily.    Dispense:  90 tablet    Refill:  3  . carvedilol (COREG) 12.5 MG tablet    Sig: Take 1 tablet (12.5 mg total) by mouth 2 (two) times daily.    Dispense:  180 tablet    Refill:  3  . potassium chloride SA (KLOR-CON M20) 20 MEQ tablet    Sig: Take 1 tablet (20 mEq total) by mouth daily.     Dispense:  90 tablet    Refill:  3  . losartan (COZAAR) 50 MG tablet    Sig: Take 1 tablet (50 mg total) by mouth daily.  Dispense:  90 tablet    Refill:  3  . simvastatin (ZOCOR) 20 MG tablet    Sig: Take 1 tablet (20 mg total) by mouth at bedtime.    Dispense:  90 tablet    Refill:  3    Priscilla Anderson  Thurmon Fair, MD, [facility] [phone] office [phone] pager

## 2013-09-30 NOTE — Assessment & Plan Note (Addendum)
This appears to be secondary to the cardiomyopathy, since its severity diminished after AV optimization.

## 2013-09-30 NOTE — Assessment & Plan Note (Addendum)
She had an excellent clinical response to cardiac resynchronization therapy. She has almost 100% biventricular pacing efficiency. She has not had recent episodes of heart failure exacerbation or major ventricular arrhythmia. I think she is an excellent candidate for remote monitoring via the care link system. The use of this was demonstrated to the patient today. Only 2 brief episodes of nonsustained ventricular tachycardia been recorded. No change in medications for the

## 2013-09-30 NOTE — Assessment & Plan Note (Signed)
She is clinically euvolemic and has excellent functional status. She is on appropriate treatment with relatively high doses of carvedilol, angiotensin II receptor blocker and aldosterone antagonist. She requires a relatively low dose of loop diuretic. No changes are made to her current medications.

## 2013-09-30 NOTE — Assessment & Plan Note (Signed)
Coronary angiography September 2006 showed extensive coronary postprocessing calcification but without flow-limiting stenoses. Proximal RCA 30%, proximal LAD 40-50%, proximal OM1 50%. Reported anterior scar may have been pacing/left bundle branch block related artifact. Hypokinesis is global

## 2013-09-30 NOTE — Assessment & Plan Note (Signed)
Severely depressed left ventricular systolic function with an ejection fraction around 30%. Despite the fact that she has some coronary atherosclerosis B. cardiomyopathy is clearly out of proportion with the degree of coronary disease. She is a CRT-D responder.

## 2013-10-01 ENCOUNTER — Encounter: Payer: Self-pay | Admitting: Cardiovascular Disease

## 2013-10-01 ENCOUNTER — Ambulatory Visit: Payer: Medicare Other | Admitting: Internal Medicine

## 2013-10-02 ENCOUNTER — Other Ambulatory Visit: Payer: Self-pay | Admitting: *Deleted

## 2013-10-04 ENCOUNTER — Other Ambulatory Visit: Payer: Self-pay | Admitting: *Deleted

## 2013-10-04 LAB — MDC_IDC_ENUM_SESS_TYPE_INCLINIC
Battery Voltage: 2.79 V
Brady Statistic AP VP Percent: 10.7 %
Brady Statistic AP VS Percent: 0.1 % — CL
Brady Statistic AS VP Percent: 88.6 %
Brady Statistic AS VS Percent: 0.7 %
HighPow Impedance: 38 Ohm
HighPow Impedance: 61 Ohm
Lead Channel Impedance Value: 323 Ohm
Lead Channel Impedance Value: 437 Ohm
Lead Channel Impedance Value: 475 Ohm
Lead Channel Pacing Threshold Amplitude: 0.5 V
Lead Channel Pacing Threshold Amplitude: 1.125 V
Lead Channel Pacing Threshold Amplitude: 1.625 V
Lead Channel Pacing Threshold Pulse Width: 0.4 ms
Lead Channel Pacing Threshold Pulse Width: 0.4 ms
Lead Channel Pacing Threshold Pulse Width: 0.4 ms
Lead Channel Sensing Intrinsic Amplitude: 4.1 mV
Lead Channel Setting Pacing Amplitude: 1.5 V
Lead Channel Setting Pacing Amplitude: 2.5 V
Lead Channel Setting Pacing Amplitude: 2.75 V
Lead Channel Setting Pacing Pulse Width: 0.4 ms
Lead Channel Setting Pacing Pulse Width: 1 ms
Lead Channel Setting Sensing Sensitivity: 0.3 mV
Zone Setting Detection Interval: 319.15 ms
Zone Setting Detection Interval: 370.37 ms
Zone Setting Detection Interval: 451.13 ms

## 2013-10-27 ENCOUNTER — Other Ambulatory Visit: Payer: Self-pay | Admitting: Internal Medicine

## 2013-11-01 ENCOUNTER — Telehealth: Payer: Self-pay | Admitting: Cardiovascular Disease

## 2013-11-01 NOTE — Telephone Encounter (Signed)
Returning your call from yesterday. °

## 2013-11-04 NOTE — Telephone Encounter (Signed)
Returning your call from Friday. °

## 2013-11-04 NOTE — Telephone Encounter (Signed)
Informed patient that her Carelink monitor has been ordered. Patient voiced understanding.

## 2013-11-13 ENCOUNTER — Other Ambulatory Visit: Payer: Self-pay | Admitting: Internal Medicine

## 2013-11-18 ENCOUNTER — Telehealth: Payer: Self-pay | Admitting: Cardiovascular Disease

## 2013-11-18 NOTE — Telephone Encounter (Signed)
Wanted to let Tobin Chad know that she has gotten monitor set up.  Just tried it.  Wants Shakila to call her to make sure set up ok.  Please call

## 2013-11-18 NOTE — Telephone Encounter (Signed)
Patient aware that transmission was received. 

## 2014-01-01 ENCOUNTER — Ambulatory Visit (INDEPENDENT_AMBULATORY_CARE_PROVIDER_SITE_OTHER): Payer: Medicare Other | Admitting: *Deleted

## 2014-01-01 DIAGNOSIS — I428 Other cardiomyopathies: Secondary | ICD-10-CM

## 2014-01-01 DIAGNOSIS — I509 Heart failure, unspecified: Secondary | ICD-10-CM

## 2014-01-01 DIAGNOSIS — I5022 Chronic systolic (congestive) heart failure: Secondary | ICD-10-CM

## 2014-01-01 DIAGNOSIS — I420 Dilated cardiomyopathy: Secondary | ICD-10-CM

## 2014-01-01 LAB — MDC_IDC_ENUM_SESS_TYPE_REMOTE
Battery Voltage: 2.69 V
Brady Statistic AS VS Percent: 0.09 %
Date Time Interrogation Session: 20150218155235
HIGH POWER IMPEDANCE MEASURED VALUE: 40 Ohm
HIGH POWER IMPEDANCE MEASURED VALUE: 55 Ohm
Lead Channel Impedance Value: 323 Ohm
Lead Channel Impedance Value: 4047 Ohm
Lead Channel Impedance Value: 418 Ohm
Lead Channel Impedance Value: 437 Ohm
Lead Channel Pacing Threshold Amplitude: 1.75 V
Lead Channel Pacing Threshold Pulse Width: 0.4 ms
Lead Channel Pacing Threshold Pulse Width: 0.4 ms
Lead Channel Pacing Threshold Pulse Width: 0.4 ms
Lead Channel Sensing Intrinsic Amplitude: 10.875 mV
Lead Channel Sensing Intrinsic Amplitude: 2.25 mV
Lead Channel Setting Pacing Amplitude: 1.5 V
Lead Channel Setting Pacing Amplitude: 2.5 V
Lead Channel Setting Pacing Amplitude: 2.75 V
Lead Channel Setting Pacing Pulse Width: 0.4 ms
Lead Channel Setting Pacing Pulse Width: 0.4 ms
Lead Channel Setting Sensing Sensitivity: 0.3 mV
MDC IDC MSMT LEADCHNL LV IMPEDANCE VALUE: 4047 Ohm
MDC IDC MSMT LEADCHNL RA PACING THRESHOLD AMPLITUDE: 0.5 V
MDC IDC MSMT LEADCHNL RV PACING THRESHOLD AMPLITUDE: 1.25 V
MDC IDC SET ZONE DETECTION INTERVAL: 350 ms
MDC IDC STAT BRADY AP VP PERCENT: 1.96 %
MDC IDC STAT BRADY AP VS PERCENT: 0 %
MDC IDC STAT BRADY AS VP PERCENT: 97.95 %
MDC IDC STAT BRADY RA PERCENT PACED: 1.96 %
MDC IDC STAT BRADY RV PERCENT PACED: 99.91 %
Zone Setting Detection Interval: 320 ms
Zone Setting Detection Interval: 370 ms
Zone Setting Detection Interval: 450 ms

## 2014-01-06 ENCOUNTER — Encounter: Payer: Self-pay | Admitting: Internal Medicine

## 2014-01-06 ENCOUNTER — Ambulatory Visit (INDEPENDENT_AMBULATORY_CARE_PROVIDER_SITE_OTHER): Payer: Medicare Other | Admitting: Internal Medicine

## 2014-01-06 ENCOUNTER — Other Ambulatory Visit: Payer: Self-pay | Admitting: Cardiovascular Disease

## 2014-01-06 VITALS — BP 118/60 | HR 82 | Temp 98.4°F | Resp 13 | Wt 110.8 lb

## 2014-01-06 DIAGNOSIS — J040 Acute laryngitis: Secondary | ICD-10-CM

## 2014-01-06 MED ORDER — PREDNISONE 20 MG PO TABS: ORAL_TABLET | ORAL | Status: DC

## 2014-01-06 NOTE — Progress Notes (Signed)
Pre visit review using our clinic review tool, if applicable. No additional management support is needed unless otherwise documented below in the visit note. 

## 2014-01-06 NOTE — Progress Notes (Signed)
   Subjective:    Patient ID: Priscilla Anderson, female    DOB: 1933-12-14, 80 y.o.   MRN: [id number]  HPI  Her symptoms started 01/02/14 as a scratchy throat and frontal headache. She took extra strength Tylenol for the symptoms. Subsequently she began to have throat congestion and hoarseness for which she took Delsym  As of 2/22 she had frank laryngitis.  She also took Claritin.   PMH of vocal cord polypectomy.      Review of Systems  She specifically denies extrinsic symptoms of itchy, watery eyes, sneezing  She has no fever, chills, or sweats  She denies any cough or sputum production. She has no shortness of breath or wheezing  The sore throat & frontal headache have resolved. She has no purulent nasal secretions, dental pain, otic pain, otic discharge.  Her fasting blood sugars average less than 110. She has had no hypoglycemia.A1c 6.9% in 10/ 2014  No PND @ night; no DOE     Objective:   Physical Exam General appearance:thin but ingood health ;well nourished; no acute distress or increased work of breathing is present.  No  lymphadenopathy about the head, neck, or axilla noted.  Appears younger than stated age  Eyes: No conjunctival inflammation or lid edema is present. There is no scleral icterus.  Ears:  External ear exam shows no significant lesions or deformities.  Otoscopic examination reveals wax on L;R TM intact  without bulging, retraction, inflammation or discharge.  Nose:  External nasal examination shows no deformity or inflammation. Nasal mucosa are pink and moist without lesions or exudates. No septal dislocation or deviation.No obstruction to airflow.   Oral exam: Partials present.Dental hygiene is good; lips and gums are healthy appearing.There is no oropharyngeal erythema or exudate noted. Very hoarse  Neck:  No deformities, thyromegaly, masses, or tenderness noted.   Supple with full range of motion without pain.   Heart:  Normal rate and regular  rhythm. S1 normal; accentuated  S2 normal without gallop, murmur, click, rub or other extra sounds.   Lungs:Chest clear to auscultation; no wheezes, rhonchi,rales ,or rubs present.No increased work of breathing.    Extremities:  No cyanosis, edema, or clubbing  noted    Skin: Warm & dry          Assessment & Plan:  #1 throat congestion & laryngitis See orders

## 2014-01-06 NOTE — Patient Instructions (Signed)
Unfortunately laryngitis is best treated with voice rest. Stay hydrated drinking to thirst, up to 32Unfortunately laryngitis is best treated with voice rest. Stay hydrated drinking to thirst, up to 40 ounces of non dairy fluids per day. ounces of non dairy fluids per day.

## 2014-01-07 ENCOUNTER — Telehealth: Payer: Self-pay | Admitting: Internal Medicine

## 2014-01-07 ENCOUNTER — Other Ambulatory Visit: Payer: Self-pay | Admitting: Internal Medicine

## 2014-01-07 MED ORDER — BENZONATATE 100 MG PO CAPS: 100.0000 mg | ORAL_CAPSULE | Freq: Three times a day (TID) | ORAL | Status: DC | PRN

## 2014-01-07 NOTE — Telephone Encounter (Signed)
Pt aware.//AB/CMA 

## 2014-01-07 NOTE — Telephone Encounter (Signed)
Pt had an appt yesterday but forgot to ask if Dr. Linna Darner can give her something for coughing. Please send to [place] if this is ok.

## 2014-01-07 NOTE — Telephone Encounter (Signed)
Done  Rx sent

## 2014-01-13 ENCOUNTER — Encounter: Payer: Self-pay | Admitting: *Deleted

## 2014-01-14 ENCOUNTER — Telehealth: Payer: Self-pay

## 2014-01-14 ENCOUNTER — Encounter: Payer: Self-pay | Admitting: Internal Medicine

## 2014-01-14 ENCOUNTER — Ambulatory Visit (INDEPENDENT_AMBULATORY_CARE_PROVIDER_SITE_OTHER): Payer: Medicare Other | Admitting: Internal Medicine

## 2014-01-14 VITALS — BP 128/82 | HR 97 | Temp 98.0°F

## 2014-01-14 DIAGNOSIS — R490 Dysphonia: Secondary | ICD-10-CM | POA: Diagnosis not present

## 2014-01-14 DIAGNOSIS — J209 Acute bronchitis, unspecified: Secondary | ICD-10-CM

## 2014-01-14 DIAGNOSIS — I10 Essential (primary) hypertension: Secondary | ICD-10-CM

## 2014-01-14 MED ORDER — AZITHROMYCIN 250 MG PO TABS: ORAL_TABLET | ORAL | Status: DC

## 2014-01-14 NOTE — Assessment & Plan Note (Signed)
Mild to mod, for antibx course,  to f/u any worsening symptoms or concerns 

## 2014-01-14 NOTE — Patient Instructions (Signed)
Please take all new medication as prescribed - the antibiotic You can also take Mucinex (or it's generic off brand) for congestion, and tylenol as needed for pain. Please continue all other medications as before, and refills have been done if requested. Please have the pharmacy call with any other refills you may need.  Please keep your appointments with your specialists as you have planned  - Dr C (cardiology)  You should consider follow up appt with Dr Ernesto Rutherford if the hoarseness does not go away in another 3-5 days

## 2014-01-14 NOTE — Telephone Encounter (Signed)
Relevant patient education assigned to patient using Emmi. ° °

## 2014-01-14 NOTE — Progress Notes (Signed)
Pre visit review using our clinic review tool, if applicable. No additional management support is needed unless otherwise documented below in the visit note. 

## 2014-01-14 NOTE — Assessment & Plan Note (Signed)
stable overall by history and exam, recent data reviewed with pt, and pt to continue medical treatment as before,  to f/u any worsening symptoms or concerns BP Readings from Last 3 Encounters:  01/14/14 128/82  01/06/14 118/60  09/30/13 122/64

## 2014-01-14 NOTE — Progress Notes (Signed)
Subjective:    Patient ID: Priscilla Anderson, female    DOB: 1934-06-08, 80 y.o.   MRN: [id number]  HPI  Here with acute onset mild to mod 2-3 days ST, HA, general weakness and malaise, with prod cough greenish sputum, but Pt denies chest pain, increased sob or doe, wheezing, orthopnea, PND, increased LE swelling, palpitations, dizziness or syncope. Did improve somewhat with predpack per Dr Linna Darner after seen with hoarseness but hoarseness persists somewhat as well.  Has hx of vocal cord polyp.  Cont's to see card on regular basis.  Does mention mild left periscapular muscular discomfort , worse with left shoudler movement after helping 33 yo mother keep legs elevated one day last wk. Past Medical History  Diagnosis Date  . ICD (implantable cardiac defibrillator) in place 09/22/2011  . Nonischemic cardiomyopathy 09/22/2011  . CHF (congestive heart failure)   . Diabetes mellitus     type II  . Hyperlipidemia   . Hypertension   . Osteopenia   . Adenomatous colon polyp   . Polyp of larynx   . CAD (coronary artery disease)    Past Surgical History  Procedure Laterality Date  . Cardiac defibrillator placement  05/21/2002    s/p re-do pacemaker/defibrillator October 2011- [place].  . Abdominal hysterectomy    . Left breast tumor resection  1980  . Colonoscopy    . Implantable cardioverter defibrillator generator change  09/01/2010    [employer]  . Cardiac catheterization  07/28/2005    noncritical CAD    reports that she has never smoked. She has never used smokeless tobacco. She reports that she does not drink alcohol or use illicit drugs. family history includes Arthritis in her father and mother; Diabetes in her other; Heart disease in her brother; Prostate cancer in her brother. No Known Allergies Current Outpatient Prescriptions on File Prior to Visit  Medication Sig Dispense Refill  . Ascorbic Acid (VITAMIN C) 500 MG tablet Take 500 mg by mouth daily.        Marland Kitchen aspirin 81 MG  tablet Take 81 mg by mouth daily.        . benzonatate (TESSALON) 100 MG capsule Take 1 capsule (100 mg total) by mouth 3 (three) times daily as needed for cough.  15 capsule  0  . Calcium Citrate-Vitamin D (CITRACAL/VITAMIN D) 250-200 MG-UNIT TABS Take by mouth daily.        . carvedilol (COREG) 12.5 MG tablet Take 1 tablet (12.5 mg total) by mouth 2 (two) times daily.  180 tablet  3  . diazepam (VALIUM) 10 MG tablet Take 10 mg by mouth. 1/2 to 1 by mouth at bedtime as needed       . digoxin (LANOXIN) 0.125 MG tablet Take 1 tablet (125 mcg total) by mouth daily.  90 tablet  3  . furosemide (LASIX) 40 MG tablet Take 1 tablet by mouth daily & alternate with 1.5 tablets the next day      . glucose blood (ACCU-CHEK AVIVA) test strip Test two times daily. Code 250.02  100 each  11  . ibandronate (BONIVA) 150 MG tablet TAKE 1 TABLET ONCE A MONTH  3 tablet  2  . JANUVIA 100 MG tablet TAKE 1 TABLET DAILY  90 tablet  3  . Lancets MISC Test as directed two times daily. Code 250.02  100 each  11  . losartan (COZAAR) 50 MG tablet Take 1 tablet (50 mg total) by mouth daily.  90 tablet  3  . metFORMIN (GLUCOPHAGE) 500 MG tablet take 1 tablet one time daily      . potassium chloride SA (KLOR-CON M20) 20 MEQ tablet Take 1 tablet (20 mEq total) by mouth daily.  90 tablet  3  . predniSONE (DELTASONE) 20 MG tablet 1/2 tid with meals  9 tablet  0  . simvastatin (ZOCOR) 20 MG tablet Take 1 tablet (20 mg total) by mouth at bedtime.  90 tablet  3  . spironolactone (ALDACTONE) 25 MG tablet Take 0.5 tablets (12.5 mg total) by mouth daily.  45 tablet  3  . Wheat Dextrin (BENEFIBER) POWD Take by mouth. 2 tablespoons once daily        No current facility-administered medications on file prior to visit.     Review of Systems  Constitutional: Negative for unexpected weight change, or unusual diaphoresis  HENT: Negative for tinnitus.   Eyes: Negative for photophobia and visual disturbance.  Respiratory: Negative for  choking and stridor.   Gastrointestinal: Negative for vomiting and blood in stool.  Genitourinary: Negative for hematuria and decreased urine volume.  Musculoskeletal: Negative for acute joint swelling Skin: Negative for color change and wound.  Neurological: Negative for tremors and numbness other than noted  Psychiatric/Behavioral: Negative for decreased concentration or  hyperactivity.       Objective:   Physical Exam BP 128/82  Pulse 97  Temp(Src) 98 F (36.7 C) (Oral)  SpO2 97% VS noted, mild ill Constitutional: Pt appears well-developed and well-nourished.  HENT: Head: NCAT.  Right Ear: External ear normal.  Left Ear: External ear normal.  Bilat tm's with mild erythema.  Max sinus areas non tender.  Pharynx with mild erythema, no exudate Eyes: Conjunctivae and EOM are normal. Pupils are equal, round, and reactive to light.  Neck: Normal range of motion. Neck supple.  Cardiovascular: Normal rate and regular rhythm.   Pulmonary/Chest: Effort normal and breath sounds decreseased bilat, no rales or wheezingl.  Mild tender periscapular Neurological: Pt is alert. Not confused  Skin: Skin is warm. No erythema.  Psychiatric: Pt behavior is normal. Thought content normal.     Assessment & Plan:

## 2014-01-14 NOTE — Assessment & Plan Note (Signed)
Improved over the past wk, ok to follow for now, but consider ENT f/u with self referral to dr Ernesto Rutherford if persists, given hx of vocal cord polyp

## 2014-01-16 ENCOUNTER — Encounter: Payer: Self-pay | Admitting: Cardiovascular Disease

## 2014-02-07 DIAGNOSIS — H6120 Impacted cerumen, unspecified ear: Secondary | ICD-10-CM | POA: Diagnosis not present

## 2014-02-12 ENCOUNTER — Telehealth: Payer: Self-pay | Admitting: Cardiovascular Disease

## 2014-02-12 NOTE — Telephone Encounter (Signed)
Please call,change in manufacture for Digoxin.

## 2014-02-12 NOTE — Telephone Encounter (Signed)
Returned call to [facility] and spoke w/ Rip Harbour.  Stated pharmacist wanted to speak w/ someone about a change in manufacturer for digoxin.    Spoke w/ pharmacist, Waunita Schooner , stated the generic digoxin manufacturer has changed and he needs approval from the doctor to fill it.    Dr. Sallyanne Kuster notified and authorized change.  Informed Waunita Schooner and verbalized understanding.

## 2014-02-24 DIAGNOSIS — Z1231 Encounter for screening mammogram for malignant neoplasm of breast: Secondary | ICD-10-CM | POA: Diagnosis not present

## 2014-02-24 LAB — HM MAMMOGRAPHY

## 2014-03-04 ENCOUNTER — Encounter: Payer: Self-pay | Admitting: Internal Medicine

## 2014-03-04 ENCOUNTER — Other Ambulatory Visit (INDEPENDENT_AMBULATORY_CARE_PROVIDER_SITE_OTHER): Payer: Medicare Other

## 2014-03-04 ENCOUNTER — Ambulatory Visit (INDEPENDENT_AMBULATORY_CARE_PROVIDER_SITE_OTHER): Payer: Medicare Other | Admitting: Internal Medicine

## 2014-03-04 VITALS — BP 112/64 | HR 82 | Temp 96.9°F | Wt 107.0 lb

## 2014-03-04 DIAGNOSIS — E119 Type 2 diabetes mellitus without complications: Secondary | ICD-10-CM | POA: Diagnosis not present

## 2014-03-04 DIAGNOSIS — G4700 Insomnia, unspecified: Secondary | ICD-10-CM | POA: Diagnosis not present

## 2014-03-04 DIAGNOSIS — I10 Essential (primary) hypertension: Secondary | ICD-10-CM | POA: Diagnosis not present

## 2014-03-04 DIAGNOSIS — E785 Hyperlipidemia, unspecified: Secondary | ICD-10-CM

## 2014-03-04 DIAGNOSIS — Z23 Encounter for immunization: Secondary | ICD-10-CM

## 2014-03-04 LAB — CBC WITH DIFFERENTIAL/PLATELET
BASOS ABS: 0.1 10*3/uL (ref 0.0–0.1)
Basophils Relative: 1.6 % (ref 0.0–3.0)
Eosinophils Absolute: 0.2 10*3/uL (ref 0.0–0.7)
Eosinophils Relative: 3 % (ref 0.0–5.0)
HCT: 37.5 % (ref 36.0–46.0)
Hemoglobin: 12.4 g/dL (ref 12.0–15.0)
Lymphocytes Relative: 20.7 % (ref 12.0–46.0)
Lymphs Abs: 1.2 10*3/uL (ref 0.7–4.0)
MCHC: 32.9 g/dL (ref 30.0–36.0)
MCV: 96.7 fl (ref 78.0–100.0)
MONO ABS: 0.5 10*3/uL (ref 0.1–1.0)
Monocytes Relative: 9.6 % (ref 3.0–12.0)
NEUTROS PCT: 65.1 % (ref 43.0–77.0)
Neutro Abs: 3.6 10*3/uL (ref 1.4–7.7)
PLATELETS: 222 10*3/uL (ref 150.0–400.0)
RBC: 3.88 Mil/uL (ref 3.87–5.11)
RDW: 14.2 % (ref 11.5–14.6)
WBC: 5.6 10*3/uL (ref 4.5–10.5)

## 2014-03-04 LAB — LIPID PANEL
CHOLESTEROL: 145 mg/dL (ref 0–200)
HDL: 56.8 mg/dL (ref >39.00)
LDL Cholesterol: 66 mg/dL (ref 0–99)
TRIGLYCERIDES: 113 mg/dL (ref 0.0–149.0)
Total CHOL/HDL Ratio: 3
VLDL: 22.6 mg/dL (ref 0.0–40.0)

## 2014-03-04 LAB — HEPATIC FUNCTION PANEL
ALK PHOS: 43 U/L (ref 39–117)
ALT: 17 U/L (ref 0–35)
AST: 24 U/L (ref 0–37)
Albumin: 4.3 g/dL (ref 3.5–5.2)
BILIRUBIN TOTAL: 0.6 mg/dL (ref 0.3–1.2)
Bilirubin, Direct: 0 mg/dL (ref 0.0–0.3)
Total Protein: 7.4 g/dL (ref 6.0–8.3)

## 2014-03-04 LAB — HEMOGLOBIN A1C: HEMOGLOBIN A1C: 6.3 % (ref 4.6–6.5)

## 2014-03-04 LAB — BASIC METABOLIC PANEL
BUN: 20 mg/dL (ref 6–23)
CO2: 33 meq/L — AB (ref 19–32)
CREATININE: 1.3 mg/dL — AB (ref 0.4–1.2)
Calcium: 10.3 mg/dL (ref 8.4–10.5)
Chloride: 97 mEq/L (ref 96–112)
GFR: 50.05 mL/min — ABNORMAL LOW (ref >60.00)
GLUCOSE: 111 mg/dL — AB (ref 70–99)
Potassium: 4.2 mEq/L (ref 3.5–5.1)
Sodium: 139 mEq/L (ref 135–145)

## 2014-03-04 LAB — TSH: TSH: 1.16 u[IU]/mL (ref 0.35–5.50)

## 2014-03-04 MED ORDER — DIAZEPAM 5 MG PO TABS: ORAL_TABLET | ORAL | Status: DC

## 2014-03-04 NOTE — Assessment & Plan Note (Signed)
Ok for valium qhs prn

## 2014-03-04 NOTE — Patient Instructions (Addendum)
You had the new Prevnar pneumonia shot today  Please continue all other medications as before, and refills have been done if requested.  Please have the pharmacy call with any other refills you may need.  Please continue your efforts at being more active, low cholesterol diet, and weight control.  You are otherwise up to date with prevention measures today.  Please go to the LAB in the Basement (turn left off the elevator) for the tests to be done today  You will be contacted by phone if any changes need to be made immediately.  Otherwise, you will receive a letter about your results with an explanation, but please check with MyChart first.  Please remember to sign up for MyChart if you have not done so, as this will be important to you in the future with finding out test results, communicating by private email, and scheduling acute appointments online when needed.  Please return in 6 months, or sooner if needed   

## 2014-03-04 NOTE — Progress Notes (Signed)
Subjective:    Patient ID: Priscilla Anderson, female    DOB: [birth year], 80 y.o.   MRN: [id number]  HPI  Here to f/u; overall doing ok,  Pt denies chest pain, increased sob or doe, wheezing, orthopnea, PND, increased LE swelling, palpitations, dizziness or syncope.  Pt denies polydipsia, polyuria, or low sugar symptoms such as weakness or confusion improved with po intake.  Pt denies new neurological symptoms such as new headache, or facial or extremity weakness or numbness.   Pt states overall good compliance with meds, has been trying to follow lower cholesterol, diabetic diet, with wt overall stable,  but little exercise however.  Denies worsening depressive symptoms, suicidal ideation, or panic; has ongoing anxiety, not increased recently.  Has had some increased stress, hard to sleep, lost 3 lbs since oct 2014.Marland Kitchen Has been takingvalium 5 mg qhs prn per cardiology, asks for refills today.  Still sees Dr C for cardiology.  Has also seen ENT dr Ernesto Rutherford, but delayed due to mother illness, by then hoarseness improved.  Has f/u appt planned. Mother recently died at 24yo.  Brother lives with pt, has prostate ca but o/w able to help. Past Medical History  Diagnosis Date  . ICD (implantable cardiac defibrillator) in place 09/22/2011  . Nonischemic cardiomyopathy 09/22/2011  . CHF (congestive heart failure)   . Diabetes mellitus     type II  . Hyperlipidemia   . Hypertension   . Osteopenia   . Adenomatous colon polyp   . Polyp of larynx   . CAD (coronary artery disease)    Past Surgical History  Procedure Laterality Date  . Cardiac defibrillator placement  05/21/2002    s/p re-do pacemaker/defibrillator October 2011- [place].  . Abdominal hysterectomy    . Left breast tumor resection  1980  . Colonoscopy    . Implantable cardioverter defibrillator generator change  09/01/2010    [employer]  . Cardiac catheterization  07/28/2005    noncritical CAD    reports that she has never smoked.  She has never used smokeless tobacco. She reports that she does not drink alcohol or use illicit drugs. family history includes Arthritis in her father and mother; Diabetes in her other; Heart disease in her brother; Prostate cancer in her brother. No Known Allergies Current Outpatient Prescriptions on File Prior to Visit  Medication Sig Dispense Refill  . Ascorbic Acid (VITAMIN C) 500 MG tablet Take 500 mg by mouth daily.        Marland Kitchen aspirin 81 MG tablet Take 81 mg by mouth daily.        . Calcium Citrate-Vitamin D (CITRACAL/VITAMIN D) 250-200 MG-UNIT TABS Take by mouth daily.        . carvedilol (COREG) 12.5 MG tablet Take 1 tablet (12.5 mg total) by mouth 2 (two) times daily.  180 tablet  3  . digoxin (LANOXIN) 0.125 MG tablet Take 1 tablet (125 mcg total) by mouth daily.  90 tablet  3  . furosemide (LASIX) 40 MG tablet Take 1 tablet by mouth daily & alternate with 1.5 tablets the next day      . glucose blood (ACCU-CHEK AVIVA) test strip Test two times daily. Code 250.02  100 each  11  . ibandronate (BONIVA) 150 MG tablet TAKE 1 TABLET ONCE A MONTH  3 tablet  2  . JANUVIA 100 MG tablet TAKE 1 TABLET DAILY  90 tablet  3  . Lancets MISC Test as directed two times daily. Code 250.02  100 each  11  . losartan (COZAAR) 50 MG tablet Take 1 tablet (50 mg total) by mouth daily.  90 tablet  3  . metFORMIN (GLUCOPHAGE) 500 MG tablet take 1 tablet one time daily      . potassium chloride SA (KLOR-CON M20) 20 MEQ tablet Take 1 tablet (20 mEq total) by mouth daily.  90 tablet  3  . simvastatin (ZOCOR) 20 MG tablet Take 1 tablet (20 mg total) by mouth at bedtime.  90 tablet  3  . spironolactone (ALDACTONE) 25 MG tablet Take 0.5 tablets (12.5 mg total) by mouth daily.  45 tablet  3  . Wheat Dextrin (BENEFIBER) POWD Take by mouth. 2 tablespoons once daily       . benzonatate (TESSALON) 100 MG capsule Take 1 capsule (100 mg total) by mouth 3 (three) times daily as needed for cough.  15 capsule  0  . predniSONE  (DELTASONE) 20 MG tablet 1/2 tid with meals  9 tablet  0   No current facility-administered medications on file prior to visit.   Review of Systems  Constitutional: Negative for unexpected weight change, or unusual diaphoresis  HENT: Negative for tinnitus.   Eyes: Negative for photophobia and visual disturbance.  Respiratory: Negative for choking and stridor.   Gastrointestinal: Negative for vomiting and blood in stool.  Genitourinary: Negative for hematuria and decreased urine volume.  Musculoskeletal: Negative for acute joint swelling Skin: Negative for color change and wound.  Neurological: Negative for tremors and numbness other than noted  Psychiatric/Behavioral: Negative for decreased concentration or  hyperactivity.       Objective:   Physical Exam BP 112/64  Pulse 82  Temp(Src) 96.9 F (36.1 C) (Oral)  Wt 107 lb (48.535 kg)  SpO2 98% VS noted,  Constitutional: Pt appears well-developed and well-nourished.  HENT: Head: NCAT.  Right Ear: External ear normal.  Left Ear: External ear normal.  Eyes: Conjunctivae and EOM are normal. Pupils are equal, round, and reactive to light.  Neck: Normal range of motion. Neck supple.  Cardiovascular: Normal rate and regular rhythm.   Pulmonary/Chest: Effort normal and breath sounds normal.  Neurological: Pt is alert. Not confused  Skin: Skin is warm. No erythema.  Psychiatric: Pt behavior is normal. Thought content normal. mild nervous    Assessment & Plan:

## 2014-03-04 NOTE — Progress Notes (Signed)
Pre visit review using our clinic review tool, if applicable. No additional management support is needed unless otherwise documented below in the visit note. 

## 2014-03-04 NOTE — Assessment & Plan Note (Signed)
stable overall by history and exam, recent data reviewed with pt, and pt to continue medical treatment as before,  to f/u any worsening symptoms or concerns BP Readings from Last 3 Encounters:  03/04/14 112/64  01/14/14 128/82  01/06/14 118/60

## 2014-03-04 NOTE — Assessment & Plan Note (Signed)
stable overall by history and exam, recent data reviewed with pt, and pt to continue medical treatment as before,  to f/u any worsening symptoms or concerns Lab Results  Component Value Date   LDLCALC 62 09/04/2013

## 2014-03-04 NOTE — Assessment & Plan Note (Signed)
stable overall by history and exam, recent data reviewed with pt, and pt to continue medical treatment as before,  to f/u any worsening symptoms or concerns' Lab Results  Component Value Date   HGBA1C 6.9* 09/04/2013

## 2014-03-05 ENCOUNTER — Other Ambulatory Visit: Payer: Self-pay | Admitting: Internal Medicine

## 2014-03-05 ENCOUNTER — Encounter: Payer: Self-pay | Admitting: Internal Medicine

## 2014-03-05 LAB — URINALYSIS, ROUTINE W REFLEX MICROSCOPIC
BILIRUBIN URINE: NEGATIVE
Hgb urine dipstick: NEGATIVE
Ketones, ur: NEGATIVE
Nitrite: NEGATIVE
Specific Gravity, Urine: 1.015 (ref 1.000–1.030)
Total Protein, Urine: NEGATIVE
UROBILINOGEN UA: 0.2 (ref 0.0–1.0)
Urine Glucose: NEGATIVE
pH: 6 (ref 5.0–8.0)

## 2014-03-05 LAB — MICROALBUMIN / CREATININE URINE RATIO
Creatinine,U: 120.2 mg/dL
MICROALB UR: 0.8 mg/dL (ref 0.0–1.9)
Microalb Creat Ratio: 0.7 mg/g (ref 0.0–30.0)

## 2014-03-05 MED ORDER — SITAGLIPTIN PHOSPHATE 100 MG PO TABS: ORAL_TABLET | ORAL | Status: DC

## 2014-03-14 DIAGNOSIS — H6120 Impacted cerumen, unspecified ear: Secondary | ICD-10-CM | POA: Diagnosis not present

## 2014-04-01 ENCOUNTER — Encounter: Payer: Self-pay | Admitting: Cardiovascular Disease

## 2014-04-01 ENCOUNTER — Ambulatory Visit (INDEPENDENT_AMBULATORY_CARE_PROVIDER_SITE_OTHER): Payer: Medicare Other | Admitting: Cardiovascular Disease

## 2014-04-01 VITALS — BP 96/64 | HR 70 | Ht 64.5 in | Wt 108.7 lb

## 2014-04-01 DIAGNOSIS — I059 Rheumatic mitral valve disease, unspecified: Secondary | ICD-10-CM

## 2014-04-01 DIAGNOSIS — I2510 Atherosclerotic heart disease of native coronary artery without angina pectoris: Secondary | ICD-10-CM | POA: Diagnosis not present

## 2014-04-01 DIAGNOSIS — I509 Heart failure, unspecified: Secondary | ICD-10-CM

## 2014-04-01 DIAGNOSIS — Z95810 Presence of automatic (implantable) cardiac defibrillator: Secondary | ICD-10-CM | POA: Diagnosis not present

## 2014-04-01 DIAGNOSIS — I428 Other cardiomyopathies: Secondary | ICD-10-CM | POA: Diagnosis not present

## 2014-04-01 DIAGNOSIS — E119 Type 2 diabetes mellitus without complications: Secondary | ICD-10-CM

## 2014-04-01 DIAGNOSIS — I420 Dilated cardiomyopathy: Secondary | ICD-10-CM

## 2014-04-01 DIAGNOSIS — I340 Nonrheumatic mitral (valve) insufficiency: Secondary | ICD-10-CM

## 2014-04-01 DIAGNOSIS — E785 Hyperlipidemia, unspecified: Secondary | ICD-10-CM

## 2014-04-01 DIAGNOSIS — I5022 Chronic systolic (congestive) heart failure: Secondary | ICD-10-CM | POA: Diagnosis not present

## 2014-04-01 LAB — MDC_IDC_ENUM_SESS_TYPE_INCLINIC
Brady Statistic AP VS Percent: 0.03 %
Brady Statistic AS VS Percent: 0.91 %
Brady Statistic RV Percent Paced: 99.06 %
HIGH POWER IMPEDANCE MEASURED VALUE: 39 Ohm
HIGH POWER IMPEDANCE MEASURED VALUE: 59 Ohm
Lead Channel Impedance Value: 323 Ohm
Lead Channel Impedance Value: 4047 Ohm
Lead Channel Impedance Value: 4047 Ohm
Lead Channel Impedance Value: 437 Ohm
Lead Channel Pacing Threshold Amplitude: 1.625 V
Lead Channel Pacing Threshold Pulse Width: 0.4 ms
Lead Channel Pacing Threshold Pulse Width: 0.4 ms
Lead Channel Pacing Threshold Pulse Width: 0.4 ms
Lead Channel Sensing Intrinsic Amplitude: 10.875 mV
Lead Channel Sensing Intrinsic Amplitude: 2.75 mV
Lead Channel Sensing Intrinsic Amplitude: 4.125 mV
Lead Channel Setting Pacing Amplitude: 1.5 V
Lead Channel Setting Pacing Amplitude: 2.75 V
Lead Channel Setting Pacing Pulse Width: 0.4 ms
Lead Channel Setting Pacing Pulse Width: 0.4 ms
Lead Channel Setting Sensing Sensitivity: 0.3 mV
MDC IDC MSMT BATTERY VOLTAGE: 2.64 V
MDC IDC MSMT LEADCHNL RA IMPEDANCE VALUE: 475 Ohm
MDC IDC MSMT LEADCHNL RA PACING THRESHOLD AMPLITUDE: 0.5 V
MDC IDC MSMT LEADCHNL RV PACING THRESHOLD AMPLITUDE: 1.375 V
MDC IDC MSMT LEADCHNL RV SENSING INTR AMPL: 10.875 mV
MDC IDC SESS DTM: 20150519131008
MDC IDC SET LEADCHNL RV PACING AMPLITUDE: 2.75 V
MDC IDC SET ZONE DETECTION INTERVAL: 320 ms
MDC IDC SET ZONE DETECTION INTERVAL: 350 ms
MDC IDC SET ZONE DETECTION INTERVAL: 450 ms
MDC IDC STAT BRADY AP VP PERCENT: 12.96 %
MDC IDC STAT BRADY AS VP PERCENT: 86.1 %
MDC IDC STAT BRADY RA PERCENT PACED: 12.99 %
Zone Setting Detection Interval: 370 ms

## 2014-04-01 NOTE — Patient Instructions (Addendum)
Remote monitoring is used to monitor your ICD from home. This monitoring reduces the number of office visits required to check your device to one time per year. It allows [place] to keep an eye on the functioning of your device to ensure it is working properly. You are scheduled for a device check from home on 05-05-2014. You may send your transmission at any time that day. If you have a wireless device, the transmission will be sent automatically. After your physician reviews your transmission, you will receive a postcard with your next transmission date.  Your physician recommends that you schedule a follow-up appointment in: [place]

## 2014-04-02 NOTE — Assessment & Plan Note (Signed)
Despite severe weight loss and the fact that she is frankly underweight, her hemoglobin A1c is 6.2%, meaning that she still needs hypoglycemic therapy. Avoid Actos.

## 2014-04-02 NOTE — Assessment & Plan Note (Signed)
She has excellent functional status despite severely depressed left ventricular systolic function. She has shown evidence of excellent response to CRT and deterioration when CRT was interrupted secondary to dislodgment years ago. No changes are made to her chronic medical therapy which includes fairly high dose of carvedilol, spironolactone, losartan, digoxin and a relatively low dose of loop diuretic. She appears to be euvolemic by clinical evaluation and thoracic impedance measurements.

## 2014-04-02 NOTE — Assessment & Plan Note (Signed)
Lipid parameters are all in the desirable range

## 2014-04-02 NOTE — Progress Notes (Signed)
Patient ID: Priscilla Anderson, female   DOB: [birth year], 80 y.o.   MRN: [id number]      Reason for office visit CHF, CRT-D follow-up  Mrs. Priscilla Anderson has not had major health challenges since her last appointment in November, but has continued to lose weight. She is frankly underweight with a BMI of around 18. She was the primary caregiver for her mother, who passed away recently at age 97.  Interrogation of her defibrillator shows normal device function. She is 99% biventricular pacing. She has only had 6 very brief episodes of nonsustained ventricular tachycardia and no therapies have been delivered. The device is approaching elective replacement indicator with a voltage of 2.64 V (CRI 2.63 V).  There has been substantial migration of the device which is now located fairly deeply in her left breast. 2 previous surgical scars are seen roughly 3 cm caudal to her left clavicle and 10 cm caudal to her left clavicle. The device was migrated and other 6-7 cm from this last spot.  She has a long-standing history of nonischemic cardiomyopathy. She has complete heart block. Her initial CRT pacemaker was placed in 1601, complicated by late coronary sinus lead dislodgment in 2006. In 2007, it was upgraded to a biventricular ICD (Medtronic [place]) with an epicardial left ventricular lead in 2007 (Medtronic [id number]). 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 She has known occlusion of left subclavian vein. 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 She has a Medtronic Sprint Fidelis [phone] dual coil active fixation defibrillator lead that is under advisory. For this reason her old RV pacemaker lead (Medtronic [id number] implanted 2003) was used for pace-sense features, the Sprint Fidelis lead was left in place for tachycardia therapies  only. The right atrial lead is the original one placed in 2003 (Medtronic [phone]) She has responded very well to cardiac resynchronization therapy. She has well compensated heart failure (NYHA functional class I) despite a very low left ventricular ejection fraction, most recently estimated to be 25-30% by an echocardiogram in July of this 2014. She has moderate eccentric mitral insufficiency that is felt to be most likely secondary to cardiomyopathy. Coronary angiography in 2006 showed scattered mild to moderate coronary stenoses (40-50% LAD, 30% RCA, 2006). Her nuclear stress test in December of 2013 was interpreted as showing a large area of anterior scar and this has been seen on nuclear stress tests performed as far back as 2004. By scintigraphy left ventricular ejection fraction was only 17%.   No Known Allergies  Current Outpatient Prescriptions  Medication Sig Dispense Refill  . Ascorbic Acid (VITAMIN C) 500 MG tablet Take 500 mg by mouth daily.        Marland Kitchen aspirin 81 MG tablet Take 81 mg by mouth daily.        . Calcium Citrate-Vitamin D (CITRACAL/VITAMIN D) 250-200 MG-UNIT TABS Take by mouth daily.        . carvedilol (COREG) 12.5 MG tablet Take 1 tablet (12.5 mg total) by mouth 2 (two) times daily.  180 tablet  3  . diazepam (VALIUM) 5 MG tablet 1 by mouth at bedtime as needed  90 tablet  1  . digoxin (LANOXIN) 0.125 MG tablet Take 1 tablet (125 mcg total) by mouth daily.  90 tablet  3  . furosemide (LASIX) 40 MG tablet Take  1 tablet by mouth daily & alternate with 1.5 tablets the next day      . glucose blood (ACCU-CHEK AVIVA) test strip Test two times daily. Code 250.02  100 each  11  . ibandronate (BONIVA) 150 MG tablet TAKE 1 TABLET ONCE A MONTH  3 tablet  2  . Lancets MISC Test as directed two times daily. Code 250.02  100 each  11  . losartan (COZAAR) 50 MG tablet Take 1 tablet (50 mg total) by mouth daily.  90 tablet  3  . metFORMIN (GLUCOPHAGE) 500 MG tablet take 1 tablet one time  daily      . potassium chloride SA (KLOR-CON M20) 20 MEQ tablet Take 1 tablet (20 mEq total) by mouth daily.  90 tablet  3  . simvastatin (ZOCOR) 20 MG tablet Take 1 tablet (20 mg total) by mouth at bedtime.  90 tablet  3  . sitaGLIPtin (JANUVIA) 100 MG tablet TAKE 1/2 TABLET DAILY  45 tablet  3  . spironolactone (ALDACTONE) 25 MG tablet Take 0.5 tablets (12.5 mg total) by mouth daily.  45 tablet  3  . Wheat Dextrin (BENEFIBER) POWD Take by mouth. 2 tablespoons once daily        No current facility-administered medications for this visit.    Past Medical History  Diagnosis Date  . ICD (implantable cardiac defibrillator) in place 09/22/2011  . Nonischemic cardiomyopathy 09/22/2011  . CHF (congestive heart failure)   . Diabetes mellitus     type II  . Hyperlipidemia   . Hypertension   . Osteopenia   . Adenomatous colon polyp   . Polyp of larynx   . CAD (coronary artery disease)     Past Surgical History  Procedure Laterality Date  . Cardiac defibrillator placement  05/21/2002    s/p re-do pacemaker/defibrillator October 2011- [place] [facility].  . Abdominal hysterectomy    . Left breast tumor resection  1980  . Colonoscopy    . Implantable cardioverter defibrillator generator change  09/01/2010    Medtronic  . Cardiac catheterization  07/28/2005    noncritical CAD    Family History  Problem Relation Age of Onset  . Arthritis Mother   . Arthritis Father   . Heart disease Brother     CABG  . Prostate cancer Brother     Prostate problem with up and down PSA  . Diabetes Other     1st degree    History   Social History  . Marital Status: Married    Spouse Name: N/A    Number of Children: 2  . Years of Education: N/A   Occupational History  . retired Investment banker, corporate. report clerk    Social History Main Topics  . Smoking status: Never Smoker   . Smokeless tobacco: Never Used  . Alcohol Use: No  . Drug Use: No  . Sexual Activity: Not on file   Other Topics Concern  .  Not on file   Social History Narrative   Husband now with Multiple Myeloma- see Dr. Juanita Craver    Review of systems: The patient specifically denies any chest pain at rest or with exertion, dyspnea at rest or with exertion, orthopnea, paroxysmal nocturnal dyspnea, syncope, palpitations, focal neurological deficits, intermittent claudication, lower extremity edema, unexplained weight gain, cough, hemoptysis or wheezing.  The patient also denies abdominal pain, nausea, vomiting, dysphagia, diarrhea, constipation, polyuria, polydipsia, dysuria, hematuria, frequency, urgency, abnormal bleeding or bruising, fever, chills, unexpected weight changes, mood swings,  change in skin or hair texture, change in voice quality, auditory or visual problems, allergic reactions or rashes, new musculoskeletal complaints other than usual "aches and pains".   PHYSICAL EXAM BP 96/64  Pulse 70  Ht 5' 4.5" (1.638 m)  Wt 108 lb 11.2 oz (49.306 kg)  BMI 18.38 kg/m2 General: Alert, oriented x3, no distress  Head: no evidence of trauma, PERRL, EOMI, no exophtalmos or lid lag, no myxedema, no xanthelasma; normal ears, nose and oropharynx  Neck: normal jugular venous pulsations and no hepatojugular reflux; brisk carotid pulses without delay and no carotid bruits  Chest: clear to auscultation, no signs of consolidation by percussion or palpation, normal fremitus, symmetrical and full respiratory excursions; the left subclavian ICD scar is healthy, the device has slipped into her breast. Collateral venous circulation is seen over the left anterior chest. Cardiovascular: Inferolaterally displaced and heaving apical impulse, regular rhythm, normal first and paradoxically split second heart sounds, grade 1-0/9 whole systolic apical murmur , No rubs or gallops  Abdomen: no tenderness or distention, no masses by palpation, no abnormal pulsatility or arterial bruits, normal bowel sounds, no hepatosplenomegaly    Extremities: no clubbing, cyanosis or edema; 2+ radial, ulnar and brachial pulses bilaterally; 2+ right femoral, posterior tibial and dorsalis pedis pulses; 2+ left femoral, posterior tibial and dorsalis pedis pulses; no subclavian or femoral bruits  Neurological: grossly nonfocal  EKG: atrial sensed biventricular paced    Lipid Panel     Component Value Date/Time   CHOL 145 03/04/2014 1434   TRIG 113.0 03/04/2014 1434   HDL 56.80 03/04/2014 1434   CHOLHDL 3 03/04/2014 1434   VLDL 22.6 03/04/2014 1434   LDLCALC 66 03/04/2014 1434    BMET    Component Value Date/Time   NA 139 03/04/2014 1434   K 4.2 03/04/2014 1434   CL 97 03/04/2014 1434   CO2 33* 03/04/2014 1434   GLUCOSE 111* 03/04/2014 1434   BUN 20 03/04/2014 1434   CREATININE 1.3* 03/04/2014 1434   CALCIUM 10.3 03/04/2014 1434   GFRNONAA 59.30 03/19/2010 1046   GFRAA 70 10/20/2008 0852     ASSESSMENT AND PLAN Chronic systolic congestive heart failure, NYHA class 1 She has excellent functional status despite severely depressed left ventricular systolic function. She has shown evidence of excellent response to CRT and deterioration when CRT was interrupted secondary to dislodgment years ago. No changes are made to her chronic medical therapy which includes fairly high dose of carvedilol, spironolactone, losartan, digoxin and a relatively low dose of loop diuretic. She appears to be euvolemic by clinical evaluation and thoracic impedance measurements.  Biventricular ICD (implantable cardioverter-defibrillator) in place  Her device has migrated substantially and when the time comes for generator change out the procedure will likely be lengthy. It'll be the fifth time that the pocket is opened. Her left ventricular lead is epicardial and attempts at re-accessing her coronary sinus after the previous dislodgment were unsuccessful. Her physical exam also suggests that the left subclavian vein is indeed occluded. She is pacemaker  dependent. All these considerations lead to substantial concern about infection at the time of generator change out, which could have disastrous consequences in this patient. Would recommend placement of a Aigis pouch. For the same reason, although she has a defibrillator lead with potential risk for coil fracture, it is not advisable that the leads be replaced. At this point her defibrillator lead is used exclusively for shock therapy and not for pace/sense features. She has never required defibrillator  therapy since device implantation.  CORONARY ARTERY DISEASE  She does not have any symptoms of angina pectoris. She has moderate coronary atherosclerotic lesions by remote angiography. Her nuclear stress test in 2013 did not show areas of reversible ischemia. Risk factors are appropriate be addressed.  DIABETES MELLITUS, TYPE II Despite severe weight loss and the fact that she is frankly underweight, her hemoglobin A1c is 6.2%, meaning that she still needs hypoglycemic therapy. Avoid Actos.  HYPERLIPIDEMIA Lipid parameters are all in the desirable range  Medtronic [place] implanted October 2011 Right atrial lead Medtronic [id number] implanted 2003 Right ventricular lead ICD sprint Fidelis 6949 implanted 2011 Old RV pacemaker lead Medtronic 5076 implanted 2003 in use as a pace sense lead Left ventricular lead epicardial Medtronic 5071 implanted 2007 Left subclavian device procedures performed in 2003, 2007, reexploration of pocket for hematoma 2007, 2011 Known occlusion of left subclavian vein by previous sonography  Orders Placed This Encounter  Procedures  . Implantable device check  . EKG 12-Lead   No orders of the defined types were placed in this encounter.    Leonard Hendler  Sanda Klein, MD, [facility] [phone] office [phone] pager

## 2014-04-02 NOTE — Assessment & Plan Note (Signed)
She does not have any symptoms of angina pectoris. She has moderate coronary atherosclerotic lesions by remote angiography. Her nuclear stress test in 2013 did not show areas of reversible ischemia. Risk factors are appropriate be addressed.

## 2014-04-02 NOTE — Assessment & Plan Note (Signed)
  Her device has migrated substantially and when the time comes for generator change out the procedure will likely be lengthy. It'll be the fifth time that the pocket is opened. Her left ventricular lead is epicardial and attempts at re-accessing her coronary sinus after the previous dislodgment were unsuccessful. Her physical exam also suggests that the left subclavian vein is indeed occluded. She is pacemaker dependent. All these considerations lead to substantial concern about infection at the time of generator change out, which could have disastrous consequences in this patient. Would recommend placement of a Aigis pouch. For the same reason, although she has a defibrillator lead with potential risk for coil fracture, it is not advisable that the leads be replaced. At this point her defibrillator lead is used exclusively for shock therapy and not for pace/sense features. She has never required defibrillator therapy since device implantation.

## 2014-04-03 ENCOUNTER — Telehealth: Payer: Self-pay | Admitting: Internal Medicine

## 2014-04-03 NOTE — Telephone Encounter (Signed)
Called the patient informed of MD instructions on medication.

## 2014-04-03 NOTE — Telephone Encounter (Signed)
Patient states that her cardiologist says that she may need to put on more weight before she has her battery changed on her defibrillator in about 2 months. Patient does not want her blood sugar to get too high trying to gain weight. Wants to know if Dr. Jenny Reichmann wants to increase her dosage on her blood sugar medications. Please advise.

## 2014-04-03 NOTE — Telephone Encounter (Signed)
Unfortunately, we would not normally advise to increase medicine before it seems you actually need it, even in her case b/c the metformin and januvia have overall less risk of low sugars than other meds  We would need to wait to see if the sugars are elevated and the a1c is increasing before more medication due to the possible risk of low sugars, that could cause other problem such as falling

## 2014-04-15 ENCOUNTER — Other Ambulatory Visit: Payer: Self-pay | Admitting: Cardiovascular Disease

## 2014-04-15 NOTE — Telephone Encounter (Signed)
Rx refill sent to patient pharmacy   

## 2014-04-21 ENCOUNTER — Other Ambulatory Visit: Payer: Self-pay | Admitting: Internal Medicine

## 2014-04-28 ENCOUNTER — Ambulatory Visit (INDEPENDENT_AMBULATORY_CARE_PROVIDER_SITE_OTHER): Payer: Medicare Other | Admitting: Ophthalmology

## 2014-04-28 DIAGNOSIS — I10 Essential (primary) hypertension: Secondary | ICD-10-CM

## 2014-04-28 DIAGNOSIS — H35039 Hypertensive retinopathy, unspecified eye: Secondary | ICD-10-CM

## 2014-04-28 DIAGNOSIS — E1139 Type 2 diabetes mellitus with other diabetic ophthalmic complication: Secondary | ICD-10-CM

## 2014-04-28 DIAGNOSIS — H43819 Vitreous degeneration, unspecified eye: Secondary | ICD-10-CM

## 2014-04-28 DIAGNOSIS — H2510 Age-related nuclear cataract, unspecified eye: Secondary | ICD-10-CM

## 2014-04-28 DIAGNOSIS — E11319 Type 2 diabetes mellitus with unspecified diabetic retinopathy without macular edema: Secondary | ICD-10-CM | POA: Diagnosis not present

## 2014-04-28 DIAGNOSIS — E1165 Type 2 diabetes mellitus with hyperglycemia: Secondary | ICD-10-CM

## 2014-05-03 ENCOUNTER — Encounter: Payer: Self-pay | Admitting: Cardiovascular Disease

## 2014-05-03 ENCOUNTER — Telehealth: Payer: Self-pay | Admitting: Cardiovascular Disease

## 2014-05-05 ENCOUNTER — Ambulatory Visit (INDEPENDENT_AMBULATORY_CARE_PROVIDER_SITE_OTHER): Payer: Medicare Other | Admitting: *Deleted

## 2014-05-05 ENCOUNTER — Encounter: Payer: Self-pay | Admitting: Cardiovascular Disease

## 2014-05-05 ENCOUNTER — Telehealth: Payer: Self-pay | Admitting: Cardiology

## 2014-05-05 DIAGNOSIS — Z95810 Presence of automatic (implantable) cardiac defibrillator: Secondary | ICD-10-CM

## 2014-05-05 NOTE — Telephone Encounter (Signed)
Spoke with pt and reminded pt of remote transmission that is due today. Pt verbalized understanding.   

## 2014-05-05 NOTE — Telephone Encounter (Signed)
New message     Pt has a defibulator----need help with the remote device check

## 2014-05-06 NOTE — Progress Notes (Signed)
Remote ICD transmission.   

## 2014-05-06 NOTE — Telephone Encounter (Signed)
Informed pt that we received her transmission. Pt verbalized understanding.

## 2014-05-12 LAB — MDC_IDC_ENUM_SESS_TYPE_REMOTE
Battery Voltage: 2.63 V
Brady Statistic AP VP Percent: 10.42 %
Brady Statistic AP VS Percent: 0.03 %
Brady Statistic AS VP Percent: 88.22 %
Brady Statistic AS VS Percent: 1.34 %
Brady Statistic RA Percent Paced: 10.45 %
Date Time Interrogation Session: 20150622181329
HighPow Impedance: 35 Ohm
HighPow Impedance: 53 Ohm
Lead Channel Impedance Value: 285 Ohm
Lead Channel Impedance Value: 4047 Ohm
Lead Channel Pacing Threshold Amplitude: 0.625 V
Lead Channel Pacing Threshold Amplitude: 1.375 V
Lead Channel Pacing Threshold Pulse Width: 0.4 ms
Lead Channel Pacing Threshold Pulse Width: 0.4 ms
Lead Channel Sensing Intrinsic Amplitude: 10.875 mV
Lead Channel Sensing Intrinsic Amplitude: 2.625 mV
Lead Channel Setting Pacing Amplitude: 1.5 V
Lead Channel Setting Pacing Amplitude: 2.5 V
Lead Channel Setting Pacing Pulse Width: 0.4 ms
MDC IDC MSMT LEADCHNL LV IMPEDANCE VALUE: 4047 Ohm
MDC IDC MSMT LEADCHNL RA IMPEDANCE VALUE: 437 Ohm
MDC IDC MSMT LEADCHNL RV IMPEDANCE VALUE: 418 Ohm
MDC IDC MSMT LEADCHNL RV PACING THRESHOLD AMPLITUDE: 1.625 V
MDC IDC MSMT LEADCHNL RV PACING THRESHOLD PULSEWIDTH: 0.4 ms
MDC IDC SET LEADCHNL RV PACING AMPLITUDE: 3.25 V
MDC IDC SET LEADCHNL RV PACING PULSEWIDTH: 0.4 ms
MDC IDC SET LEADCHNL RV SENSING SENSITIVITY: 0.3 mV
MDC IDC STAT BRADY RV PERCENT PACED: 98.64 %
Zone Setting Detection Interval: 320 ms
Zone Setting Detection Interval: 350 ms
Zone Setting Detection Interval: 370 ms
Zone Setting Detection Interval: 450 ms

## 2014-05-28 ENCOUNTER — Encounter: Payer: Self-pay | Admitting: Cardiology

## 2014-06-05 ENCOUNTER — Ambulatory Visit (INDEPENDENT_AMBULATORY_CARE_PROVIDER_SITE_OTHER): Payer: Medicare Other | Admitting: *Deleted

## 2014-06-05 DIAGNOSIS — I509 Heart failure, unspecified: Secondary | ICD-10-CM

## 2014-06-05 DIAGNOSIS — I428 Other cardiomyopathies: Secondary | ICD-10-CM

## 2014-06-05 DIAGNOSIS — I5022 Chronic systolic (congestive) heart failure: Secondary | ICD-10-CM

## 2014-06-05 DIAGNOSIS — I420 Dilated cardiomyopathy: Secondary | ICD-10-CM

## 2014-06-05 LAB — MDC_IDC_ENUM_SESS_TYPE_REMOTE
Battery Voltage: 2.61 V
Brady Statistic AP VP Percent: 12.96 %
Brady Statistic AP VS Percent: 0.03 %
Brady Statistic AS VP Percent: 85.41 %
Brady Statistic RA Percent Paced: 12.99 %
Brady Statistic RV Percent Paced: 98.37 %
HighPow Impedance: 39 Ohm
HighPow Impedance: 58 Ohm
Lead Channel Impedance Value: 4047 Ohm
Lead Channel Impedance Value: 475 Ohm
Lead Channel Pacing Threshold Amplitude: 0.625 V
Lead Channel Pacing Threshold Amplitude: 1.375 V
Lead Channel Pacing Threshold Pulse Width: 0.4 ms
Lead Channel Pacing Threshold Pulse Width: 0.4 ms
Lead Channel Sensing Intrinsic Amplitude: 10.875 mV
Lead Channel Sensing Intrinsic Amplitude: 2.5 mV
Lead Channel Setting Pacing Amplitude: 1.5 V
Lead Channel Setting Pacing Amplitude: 2.75 V
Lead Channel Setting Pacing Pulse Width: 0.4 ms
MDC IDC MSMT LEADCHNL LV IMPEDANCE VALUE: 323 Ohm
MDC IDC MSMT LEADCHNL LV IMPEDANCE VALUE: 4047 Ohm
MDC IDC MSMT LEADCHNL LV PACING THRESHOLD AMPLITUDE: 1.75 V
MDC IDC MSMT LEADCHNL RV IMPEDANCE VALUE: 437 Ohm
MDC IDC MSMT LEADCHNL RV PACING THRESHOLD PULSEWIDTH: 0.4 ms
MDC IDC SESS DTM: 20150723141551
MDC IDC SET LEADCHNL RV PACING AMPLITUDE: 3 V
MDC IDC SET LEADCHNL RV PACING PULSEWIDTH: 0.4 ms
MDC IDC SET LEADCHNL RV SENSING SENSITIVITY: 0.3 mV
MDC IDC SET ZONE DETECTION INTERVAL: 450 ms
MDC IDC STAT BRADY AS VS PERCENT: 1.59 %
Zone Setting Detection Interval: 320 ms
Zone Setting Detection Interval: 350 ms
Zone Setting Detection Interval: 370 ms

## 2014-06-05 NOTE — Progress Notes (Signed)
Remote ICD transmission.   

## 2014-07-02 ENCOUNTER — Other Ambulatory Visit: Payer: Self-pay | Admitting: Internal Medicine

## 2014-07-03 ENCOUNTER — Encounter: Payer: Medicare Other | Admitting: Cardiovascular Disease

## 2014-07-10 ENCOUNTER — Encounter: Payer: Self-pay | Admitting: Cardiovascular Disease

## 2014-07-14 ENCOUNTER — Other Ambulatory Visit: Payer: Self-pay | Admitting: Cardiovascular Disease

## 2014-07-14 NOTE — Telephone Encounter (Signed)
Rx was sent to pharmacy electronically. 

## 2014-07-15 ENCOUNTER — Other Ambulatory Visit: Payer: Self-pay | Admitting: *Deleted

## 2014-07-15 MED ORDER — FUROSEMIDE 40 MG PO TABS: ORAL_TABLET | ORAL | Status: DC

## 2014-07-15 NOTE — Telephone Encounter (Signed)
Rx was sent to pharmacy electronically. 

## 2014-07-22 ENCOUNTER — Encounter: Payer: Self-pay | Admitting: Cardiovascular Disease

## 2014-07-22 ENCOUNTER — Ambulatory Visit (INDEPENDENT_AMBULATORY_CARE_PROVIDER_SITE_OTHER): Payer: Medicare Other | Admitting: Cardiovascular Disease

## 2014-07-22 VITALS — BP 138/72 | HR 80 | Resp 16 | Ht 64.0 in | Wt 113.3 lb

## 2014-07-22 DIAGNOSIS — I2510 Atherosclerotic heart disease of native coronary artery without angina pectoris: Secondary | ICD-10-CM

## 2014-07-22 DIAGNOSIS — R5383 Other fatigue: Secondary | ICD-10-CM | POA: Diagnosis not present

## 2014-07-22 DIAGNOSIS — Z79899 Other long term (current) drug therapy: Secondary | ICD-10-CM | POA: Diagnosis not present

## 2014-07-22 DIAGNOSIS — D689 Coagulation defect, unspecified: Secondary | ICD-10-CM | POA: Diagnosis not present

## 2014-07-22 DIAGNOSIS — R5381 Other malaise: Secondary | ICD-10-CM | POA: Diagnosis not present

## 2014-07-22 DIAGNOSIS — Z4502 Encounter for adjustment and management of automatic implantable cardiac defibrillator: Secondary | ICD-10-CM

## 2014-07-22 LAB — CBC
HCT: 36.3 % (ref 36.0–46.0)
Hemoglobin: 12.7 g/dL (ref 12.0–15.0)
MCH: 31.4 pg (ref 26.0–34.0)
MCHC: 35 g/dL (ref 30.0–36.0)
MCV: 89.9 fL (ref 78.0–100.0)
Platelets: 232 K/uL (ref 150–400)
RBC: 4.04 MIL/uL (ref 3.87–5.11)
RDW: 13.5 % (ref 11.5–15.5)
WBC: 7.5 K/uL (ref 4.0–10.5)

## 2014-07-22 LAB — MDC_IDC_ENUM_SESS_TYPE_INCLINIC
Brady Statistic AP VS Percent: 0.03 %
Brady Statistic AS VP Percent: 88.02 %
Brady Statistic AS VS Percent: 1.65 %
Brady Statistic RA Percent Paced: 10.33 %
Date Time Interrogation Session: 20150908153609
HIGH POWER IMPEDANCE MEASURED VALUE: 38 Ohm
HighPow Impedance: 61 Ohm
Lead Channel Impedance Value: 323 Ohm
Lead Channel Impedance Value: 4047 Ohm
Lead Channel Impedance Value: 4047 Ohm
Lead Channel Impedance Value: 475 Ohm
Lead Channel Pacing Threshold Amplitude: 0.625 V
Lead Channel Pacing Threshold Amplitude: 1.75 V
Lead Channel Pacing Threshold Pulse Width: 0.4 ms
Lead Channel Pacing Threshold Pulse Width: 0.4 ms
Lead Channel Sensing Intrinsic Amplitude: 3.75 mV
Lead Channel Sensing Intrinsic Amplitude: 4 mV
Lead Channel Setting Pacing Amplitude: 1.5 V
Lead Channel Setting Pacing Amplitude: 2.5 V
Lead Channel Setting Sensing Sensitivity: 0.3 mV
MDC IDC MSMT BATTERY VOLTAGE: 2.61 V
MDC IDC MSMT LEADCHNL RV IMPEDANCE VALUE: 475 Ohm
MDC IDC MSMT LEADCHNL RV PACING THRESHOLD AMPLITUDE: 1.375 V
MDC IDC MSMT LEADCHNL RV PACING THRESHOLD PULSEWIDTH: 0.4 ms
MDC IDC MSMT LEADCHNL RV SENSING INTR AMPL: 10.875 mV
MDC IDC MSMT LEADCHNL RV SENSING INTR AMPL: 10.875 mV
MDC IDC SET LEADCHNL LV PACING AMPLITUDE: 2.25 V
MDC IDC SET LEADCHNL LV PACING PULSEWIDTH: 0.4 ms
MDC IDC SET LEADCHNL RV PACING PULSEWIDTH: 0.4 ms
MDC IDC SET ZONE DETECTION INTERVAL: 350 ms
MDC IDC SET ZONE DETECTION INTERVAL: 370 ms
MDC IDC SET ZONE DETECTION INTERVAL: 450 ms
MDC IDC STAT BRADY AP VP PERCENT: 10.3 %
MDC IDC STAT BRADY RV PERCENT PACED: 98.32 %
Zone Setting Detection Interval: 320 ms

## 2014-07-22 LAB — PROTIME-INR
INR: 1.02 (ref <1.50)
PROTHROMBIN TIME: 13.4 s (ref 11.6–15.2)

## 2014-07-22 LAB — APTT: APTT: 37 s (ref 24–37)

## 2014-07-22 NOTE — Patient Instructions (Signed)
The battery in your Defibrillator (Medtronic BIV ICD) needs to be changed out.This will be done as an out patient at [facility] Tuesday July 29, 2014.  Your physician recommends that you return for lab work in: today at [place] lab on the first floor.

## 2014-07-22 NOTE — Progress Notes (Signed)
Patient ID: Priscilla Anderson, female   DOB: 1934-06-17, 80 y.o.   MRN: [id number]     Reason for office visit CRT-D at [facility]  Priscilla Anderson's body ICD reached elective replacement indicator on August 20. She has increased her dietary intake of food and has/weight so that she is no longer severely underweight. Her BMI is increased to about 19. This has been associated with a slight increase in her blood pressure and her blood glucose so that she has had to increase her Januvia back to a full tablet. She is pacemaker dependent due to complete heart block. Chest had 4 surgical procedures in her left subclavian area and her left ventricular lead is epicardial. Her device has migrated caudally, into her breast, over at least 12-15 cm from her clavicle.  She has a long-standing history of nonischemic cardiomyopathy. She has complete heart block. Her initial CRT pacemaker was placed in 9122, complicated by late coronary sinus lead dislodgment in 2006. In 2007, it was upgraded to a biventricular ICD (Medtronic [place]) with an epicardial left ventricular lead in 2007 (Medtronic [phone]). 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 She has known occlusion of left subclavian vein. 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 She has a Medtronic Sprint Fidelis [phone] dual coil active fixation defibrillator lead that is under advisory. For this reason her old RV pacemaker lead (Medtronic [id number] implanted 2003) was used for pace-sense features, the Sprint Fidelis lead was left in place for tachycardia therapies only. The right atrial lead is the original one placed in 2003 (Medtronic [phone])  She has responded very well to cardiac resynchronization therapy. She has well compensated heart failure (NYHA functional class I) despite a very low left  ventricular ejection fraction, most recently estimated to be 25-30% by an echocardiogram in July of this 2014. She has moderate eccentric mitral insufficiency that is felt to be most likely secondary to cardiomyopathy. Coronary angiography in 2006 showed scattered mild to moderate coronary stenoses (40-50% LAD, 30% RCA, 2006). Her nuclear stress test in December of 2013 was interpreted as showing a large area of anterior scar and this has been seen on nuclear stress tests performed as far back as 2004. By scintigraphy left ventricular ejection fraction was only 17%.    No Known Allergies  Current Outpatient Prescriptions  Medication Sig Dispense Refill  . Ascorbic Acid (VITAMIN C) 500 MG tablet Take 500 mg by mouth daily.        Marland Kitchen aspirin 81 MG tablet Take 81 mg by mouth daily.        . Calcium Citrate-Vitamin D (CITRACAL/VITAMIN D) 250-200 MG-UNIT TABS Take by mouth daily.        . carvedilol (COREG) 12.5 MG tablet Take 1 tablet (12.5 mg total) by mouth 2 (two) times daily.  180 tablet  3  . diazepam (VALIUM) 5 MG tablet 1 by mouth at bedtime as needed  90 tablet  1  . digoxin (LANOXIN) 0.125 MG tablet Take 1 tablet (125 mcg total) by mouth daily.  90 tablet  3  . furosemide (LASIX) 40 MG tablet TAKE 1 TABLET EVERY OTHER DAY ALTERNATING WITH ONE AND ONE-HALF TABLETS EVERY OTHER DAY  135 tablet  3  . glucose blood (ACCU-CHEK AVIVA) test strip Test two times daily. Code 250.02  100 each  11  . ibandronate (BONIVA) 150 MG tablet TAKE 1 TABLET ONCE A MONTH.  3 tablet  6  . Lancets MISC Test as directed two times daily. Code 250.02  100 each  11  . losartan (COZAAR) 50 MG tablet Take 1 tablet (50 mg total) by mouth daily.  90 tablet  3  . metFORMIN (GLUCOPHAGE) 500 MG tablet take 1 tablet one time daily      . potassium chloride SA (KLOR-CON M20) 20 MEQ tablet Take 1 tablet (20 mEq total) by mouth daily.  90 tablet  3  . simvastatin (ZOCOR) 20 MG tablet Take 1 tablet (20 mg total) by mouth at  bedtime.  90 tablet  3  . sitaGLIPtin (JANUVIA) 100 MG tablet Take 100 mg by mouth daily.      Marland Kitchen spironolactone (ALDACTONE) 25 MG tablet Take 0.5 tablets (12.5 mg total) by mouth daily.  45 tablet  3  . Wheat Dextrin (BENEFIBER) POWD Take by mouth. 2 tablespoons once daily        No current facility-administered medications for this visit.    Past Medical History  Diagnosis Date  . ICD (implantable cardiac defibrillator) in place 09/22/2011  . Nonischemic cardiomyopathy 09/22/2011  . CHF (congestive heart failure)   . Diabetes mellitus     type II  . Hyperlipidemia   . Hypertension   . Osteopenia   . Adenomatous colon polyp   . Polyp of larynx   . CAD (coronary artery disease)     Past Surgical History  Procedure Laterality Date  . Cardiac defibrillator placement  05/21/2002    s/p re-do pacemaker/defibrillator October 2011- [place].  . Abdominal hysterectomy    . Left breast tumor resection  1980  . Colonoscopy    . Implantable cardioverter defibrillator generator change  09/01/2010    Medtronic  . Cardiac catheterization  07/28/2005    noncritical CAD    Family History  Problem Relation Age of Onset  . Arthritis Mother   . Arthritis Father   . Heart disease Brother     CABG  . Prostate cancer Brother     Prostate problem with up and down PSA  . Diabetes Other     1st degree    History   Social History  . Marital Status: Married    Spouse Name: N/A    Number of Children: 2  . Years of Education: N/A   Occupational History  . retired Investment banker, corporate. report clerk    Social History Main Topics  . Smoking status: Never Smoker   . Smokeless tobacco: Never Used  . Alcohol Use: No  . Drug Use: No  . Sexual Activity: Not on file   Other Topics Concern  . Not on file   Social History Narrative   Husband now with Multiple Myeloma- see Dr. Juanita Craver    Review of systems: The patient specifically denies any chest pain at rest or with  exertion, dyspnea at rest or with exertion, orthopnea, paroxysmal nocturnal dyspnea, syncope, palpitations, focal neurological deficits, intermittent claudication, lower extremity edema, unexplained weight gain, cough, hemoptysis or wheezing.  The patient also denies abdominal pain, nausea, vomiting, dysphagia, diarrhea, constipation, polyuria, polydipsia, dysuria, hematuria, frequency, urgency, abnormal bleeding or bruising, fever, chills, unexpected weight changes, mood swings, change in skin or hair texture, change in voice quality, auditory or visual problems, allergic reactions or rashes, new musculoskeletal complaints other than usual "aches and pains".   PHYSICAL EXAM BP 138/72  Pulse 80  Resp 16  Ht 5' 4"  (1.626 m)  Wt 113 lb 4.8 oz (51.393 kg)  BMI 19.44 kg/m2 General: Alert, oriented x3, no distress  Head: no evidence of trauma, PERRL, EOMI, no exophtalmos or lid lag, no myxedema, no xanthelasma; normal ears, nose and oropharynx  Neck: normal jugular venous pulsations and no hepatojugular reflux; brisk carotid pulses without delay and no carotid bruits  Chest: clear to auscultation, no signs of consolidation by percussion or palpation, normal fremitus, symmetrical and full respiratory excursions; the left subclavian ICD scar is healthy, the device has slipped into her breast. Collateral venous circulation is seen over the left anterior chest.  Cardiovascular: Inferolaterally displaced and heaving apical impulse, regular rhythm, normal first and paradoxically split second heart sounds, grade 1-6/6 whole systolic apical murmur , No rubs or gallops  Abdomen: no tenderness or distention, no masses by palpation, no abnormal pulsatility or arterial bruits, normal bowel sounds, no hepatosplenomegaly  Extremities: no clubbing, cyanosis or edema; 2+ radial, ulnar and brachial pulses bilaterally; 2+ right femoral, posterior tibial and dorsalis pedis pulses; 2+ left femoral, posterior tibial and  dorsalis pedis pulses; no subclavian or femoral bruits  Neurological: grossly nonfocal    EKG: atrial sensed biventricular paced    Lipid Panel     Component Value Date/Time   CHOL 145 03/04/2014 1434   TRIG 113.0 03/04/2014 1434   HDL 56.80 03/04/2014 1434   CHOLHDL 3 03/04/2014 1434   VLDL 22.6 03/04/2014 1434   LDLCALC 66 03/04/2014 1434    BMET    Component Value Date/Time   NA 139 03/04/2014 1434   K 4.2 03/04/2014 1434   CL 97 03/04/2014 1434   CO2 33* 03/04/2014 1434   GLUCOSE 111* 03/04/2014 1434   BUN 20 03/04/2014 1434   CREATININE 1.3* 03/04/2014 1434   CALCIUM 10.3 03/04/2014 1434   GFRNONAA 59.30 03/19/2010 1046   GFRAA 70 10/20/2008 0852     ASSESSMENT AND PLAN Chronic systolic congestive heart failure, NYHA class 1  She has excellent functional status despite severely depressed left ventricular systolic function. She has shown evidence of excellent response to CRT and deterioration when CRT was interrupted secondary to dislodgment years ago. No changes are made to her chronic medical therapy which includes fairly high dose of carvedilol, spironolactone, losartan, digoxin and a relatively low dose of loop diuretic.  She appears to be euvolemic by clinical evaluation and thoracic impedance measurements.   Biventricular ICD (implantable cardioverter-defibrillator) in place  Her device has migrated substantially. He generator change out the procedure will likely be lengthy. It'll be the fifth time that the pocket is opened. Her left ventricular lead is epicardial and attempts at re-accessing her coronary sinus after the previous dislodgment were unsuccessful. Her physical exam also suggests that the left subclavian vein is indeed occluded.  She is pacemaker dependent.  All these considerations lead to substantial concern about infection at the time of generator change out, which could have disastrous consequences in this patient. Would recommend placement of a Aigis pouch.  For  the same reason, although she has a defibrillator lead with potential risk for coil fracture, it is not advisable that the leads be replaced. At this point her defibrillator lead is used exclusively for shock therapy and not for pace/sense features. She has never required defibrillator therapy since device implantation.  All these considerations were discussed in detail with Priscilla Anderson. I'm glad that she has put on a little weight, since this may  provide little if additional buffer when she receives a relatively large device with the added bulk of the Aigis pouch.  CORONARY ARTERY DISEASE  She does not have any symptoms of angina pectoris. She has moderate coronary atherosclerotic lesions by remote angiography. Her nuclear stress test in 2013 did not show areas of reversible ischemia. Risk factors are appropriate be addressed.   DIABETES MELLITUS, TYPE II  Avoid Actos.   HYPERLIPIDEMIA  Lipid parameters are all in the desirable range   Medtronic Consulta implanted October 2011  Right atrial lead Medtronic 5076 implanted 2003  Right ventricular lead ICD sprint Fidelis 6949 implanted 2011  Old RV pacemaker lead Medtronic 5076 implanted 2003 in use as a pace sense lead  Left ventricular lead epicardial Medtronic 5071 implanted 2007  Left subclavian device procedures performed in 2003, 2007, reexploration of pocket for hematoma 2007, 2011  Known occlusion of left subclavian vein by previous sonography  Orders Placed This Encounter  Procedures  . CBC  . Comprehensive metabolic panel  . APTT  . Protime-INR  . IMPLANTABLE CARDIOVERTER DEFIBRILLATOR (ICD) GENERATOR CHANGE   Meds ordered this encounter  Medications  . sitaGLIPtin (JANUVIA) 100 MG tablet    Sig: Take 100 mg by mouth daily.    Holli Humbles, MD, [place] [phone] office [phone] pager

## 2014-07-23 ENCOUNTER — Telehealth: Payer: Self-pay | Admitting: Cardiovascular Disease

## 2014-07-23 ENCOUNTER — Other Ambulatory Visit: Payer: Self-pay | Admitting: *Deleted

## 2014-07-23 DIAGNOSIS — Z4502 Encounter for adjustment and management of automatic implantable cardiac defibrillator: Secondary | ICD-10-CM

## 2014-07-23 LAB — COMPREHENSIVE METABOLIC PANEL WITH GFR
ALT: 13 U/L (ref 0–35)
AST: 19 U/L (ref 0–37)
Albumin: 4.9 g/dL (ref 3.5–5.2)
Alkaline Phosphatase: 46 U/L (ref 39–117)
BUN: 25 mg/dL — ABNORMAL HIGH (ref 6–23)
CO2: 31 meq/L (ref 19–32)
Calcium: 10.7 mg/dL — ABNORMAL HIGH (ref 8.4–10.5)
Chloride: 99 meq/L (ref 96–112)
Creat: 1.44 mg/dL — ABNORMAL HIGH (ref 0.50–1.10)
Glucose, Bld: 114 mg/dL — ABNORMAL HIGH (ref 70–99)
Potassium: 5 meq/L (ref 3.5–5.3)
Sodium: 137 meq/L (ref 135–145)
Total Bilirubin: 0.4 mg/dL (ref 0.2–1.2)
Total Protein: 7.3 g/dL (ref 6.0–8.3)

## 2014-07-23 NOTE — Telephone Encounter (Signed)
Spoke with patient regarding BIV ICD generator change scheduled for Tuesday 07/29/14 @ 3:00pm.  Patient told to arrive at [facility] at 1:00 pm ---NPO after midnight. Do not take any Januvia or Metformin the morning of you procedure.  Instructions mailed to patient.   Patient voiced understanding.

## 2014-07-23 NOTE — Telephone Encounter (Signed)
Patient informed that audible alert is due to batt. Encouraged pt to send remote to be sure that nothing else is going on. Patient voiced understanding.

## 2014-07-23 NOTE — Telephone Encounter (Signed)
Pt called in stating that her defibrillator was making a noise that indicated that the battery is low and she stated that she will be getting the defibrillator replaced next week. She would just like to know what she should do until then. She would feel more comfortable getting this information from [place] if she could. Please call  Thanks

## 2014-07-24 ENCOUNTER — Encounter (HOSPITAL_COMMUNITY): Payer: Self-pay

## 2014-07-28 ENCOUNTER — Encounter: Payer: Self-pay | Admitting: Cardiovascular Disease

## 2014-07-28 DIAGNOSIS — Z860100 Personal history of colon polyps, unspecified: Secondary | ICD-10-CM | POA: Diagnosis not present

## 2014-07-28 DIAGNOSIS — I442 Atrioventricular block, complete: Secondary | ICD-10-CM | POA: Diagnosis not present

## 2014-07-28 DIAGNOSIS — I10 Essential (primary) hypertension: Secondary | ICD-10-CM | POA: Diagnosis not present

## 2014-07-28 DIAGNOSIS — Z4502 Encounter for adjustment and management of automatic implantable cardiac defibrillator: Secondary | ICD-10-CM | POA: Diagnosis not present

## 2014-07-28 DIAGNOSIS — Z79899 Other long term (current) drug therapy: Secondary | ICD-10-CM | POA: Diagnosis not present

## 2014-07-28 DIAGNOSIS — E119 Type 2 diabetes mellitus without complications: Secondary | ICD-10-CM | POA: Diagnosis not present

## 2014-07-28 DIAGNOSIS — E785 Hyperlipidemia, unspecified: Secondary | ICD-10-CM | POA: Diagnosis not present

## 2014-07-28 DIAGNOSIS — I2510 Atherosclerotic heart disease of native coronary artery without angina pectoris: Secondary | ICD-10-CM | POA: Diagnosis not present

## 2014-07-28 DIAGNOSIS — I428 Other cardiomyopathies: Secondary | ICD-10-CM | POA: Diagnosis not present

## 2014-07-28 DIAGNOSIS — Z7982 Long term (current) use of aspirin: Secondary | ICD-10-CM | POA: Diagnosis not present

## 2014-07-28 DIAGNOSIS — M899 Disorder of bone, unspecified: Secondary | ICD-10-CM | POA: Diagnosis not present

## 2014-07-28 DIAGNOSIS — M949 Disorder of cartilage, unspecified: Secondary | ICD-10-CM | POA: Diagnosis not present

## 2014-07-28 DIAGNOSIS — I5022 Chronic systolic (congestive) heart failure: Secondary | ICD-10-CM | POA: Diagnosis not present

## 2014-07-28 DIAGNOSIS — I509 Heart failure, unspecified: Secondary | ICD-10-CM | POA: Diagnosis not present

## 2014-07-28 DIAGNOSIS — Z86718 Personal history of other venous thrombosis and embolism: Secondary | ICD-10-CM | POA: Diagnosis not present

## 2014-07-28 MED ORDER — GENTAMICIN SULFATE 40 MG/ML IJ SOLN
80.0000 mg | INTRAMUSCULAR | Status: AC
  Filled 2014-07-28: qty 2

## 2014-07-28 MED ORDER — CEFAZOLIN SODIUM-DEXTROSE 2-3 GM-% IV SOLR: 2.0000 g | INTRAVENOUS | Status: AC

## 2014-07-29 ENCOUNTER — Encounter (HOSPITAL_COMMUNITY)
Admission: RE | Disposition: A | Discharge status: Home / Self Care | Payer: Self-pay | Source: Ambulatory Visit | Attending: Cardiovascular Disease

## 2014-07-29 ENCOUNTER — Ambulatory Visit (HOSPITAL_COMMUNITY)
Admission: RE | Admit: 2014-07-29 | Discharge: 2014-07-29 | Disposition: A | Discharge status: Home / Self Care | Payer: Medicare Other | Source: Ambulatory Visit | Attending: Cardiovascular Disease | Admitting: Cardiovascular Disease

## 2014-07-29 DIAGNOSIS — I509 Heart failure, unspecified: Secondary | ICD-10-CM

## 2014-07-29 DIAGNOSIS — I442 Atrioventricular block, complete: Secondary | ICD-10-CM | POA: Diagnosis not present

## 2014-07-29 DIAGNOSIS — I5022 Chronic systolic (congestive) heart failure: Secondary | ICD-10-CM | POA: Diagnosis not present

## 2014-07-29 DIAGNOSIS — Z4502 Encounter for adjustment and management of automatic implantable cardiac defibrillator: Secondary | ICD-10-CM | POA: Insufficient documentation

## 2014-07-29 DIAGNOSIS — M949 Disorder of cartilage, unspecified: Secondary | ICD-10-CM

## 2014-07-29 DIAGNOSIS — Z79899 Other long term (current) drug therapy: Secondary | ICD-10-CM | POA: Insufficient documentation

## 2014-07-29 DIAGNOSIS — I428 Other cardiomyopathies: Secondary | ICD-10-CM | POA: Insufficient documentation

## 2014-07-29 DIAGNOSIS — Z7982 Long term (current) use of aspirin: Secondary | ICD-10-CM | POA: Diagnosis not present

## 2014-07-29 DIAGNOSIS — Z95810 Presence of automatic (implantable) cardiac defibrillator: Secondary | ICD-10-CM | POA: Diagnosis not present

## 2014-07-29 DIAGNOSIS — I2510 Atherosclerotic heart disease of native coronary artery without angina pectoris: Secondary | ICD-10-CM | POA: Insufficient documentation

## 2014-07-29 DIAGNOSIS — Z86718 Personal history of other venous thrombosis and embolism: Secondary | ICD-10-CM | POA: Diagnosis not present

## 2014-07-29 DIAGNOSIS — E119 Type 2 diabetes mellitus without complications: Secondary | ICD-10-CM | POA: Insufficient documentation

## 2014-07-29 DIAGNOSIS — M899 Disorder of bone, unspecified: Secondary | ICD-10-CM | POA: Insufficient documentation

## 2014-07-29 DIAGNOSIS — Z860100 Personal history of colon polyps, unspecified: Secondary | ICD-10-CM | POA: Insufficient documentation

## 2014-07-29 DIAGNOSIS — E785 Hyperlipidemia, unspecified: Secondary | ICD-10-CM | POA: Insufficient documentation

## 2014-07-29 DIAGNOSIS — I10 Essential (primary) hypertension: Secondary | ICD-10-CM | POA: Insufficient documentation

## 2014-07-29 HISTORY — PX: BIV ICD GENERTAOR CHANGE OUT: SHX5745

## 2014-07-29 LAB — GLUCOSE, CAPILLARY
GLUCOSE-CAPILLARY: 99 mg/dL (ref 70–99)
Glucose-Capillary: 83 mg/dL (ref 70–99)

## 2014-07-29 LAB — SURGICAL PCR SCREEN
MRSA, PCR: NEGATIVE
Staphylococcus aureus: NEGATIVE

## 2014-07-29 SURGERY — BIV ICD GENERTAOR CHANGE OUT
Anesthesia: LOCAL

## 2014-07-29 MED ORDER — SODIUM CHLORIDE 0.9 % IV SOLN: INTRAVENOUS | Status: DC

## 2014-07-29 MED ORDER — MUPIROCIN 2 % EX OINT: TOPICAL_OINTMENT | Freq: Two times a day (BID) | CUTANEOUS | Status: DC

## 2014-07-29 MED ORDER — ACETAMINOPHEN 325 MG PO TABS: 325.0000 mg | ORAL_TABLET | ORAL | Status: DC | PRN

## 2014-07-29 MED ORDER — MUPIROCIN 2 % EX OINT
TOPICAL_OINTMENT | CUTANEOUS | Status: AC
  Filled 2014-07-29: qty 22

## 2014-07-29 MED ORDER — MIDAZOLAM HCL 5 MG/5ML IJ SOLN
INTRAMUSCULAR | Status: AC
  Filled 2014-07-29: qty 5

## 2014-07-29 MED ORDER — CEFAZOLIN SODIUM-DEXTROSE 2-3 GM-% IV SOLR
INTRAVENOUS | Status: AC
  Filled 2014-07-29: qty 50

## 2014-07-29 MED ORDER — SODIUM CHLORIDE 0.9 % IV SOLN
INTRAVENOUS | Status: DC
  Administered 2014-07-29: 14:00:00 via INTRAVENOUS

## 2014-07-29 MED ORDER — LIDOCAINE HCL (PF) 1 % IJ SOLN
INTRAMUSCULAR | Status: AC
  Filled 2014-07-29: qty 30

## 2014-07-29 MED ORDER — SODIUM CHLORIDE 0.9 % IJ SOLN: 3.0000 mL | INTRAMUSCULAR | Status: DC | PRN

## 2014-07-29 MED ORDER — FENTANYL CITRATE 0.05 MG/ML IJ SOLN
INTRAMUSCULAR | Status: AC
  Filled 2014-07-29: qty 2

## 2014-07-29 MED ORDER — ONDANSETRON HCL 4 MG/2ML IJ SOLN: 4.0000 mg | Freq: Four times a day (QID) | INTRAMUSCULAR | Status: DC | PRN

## 2014-07-29 NOTE — Progress Notes (Signed)
ICD Criteria  Current LVEF:25-30% ;Obtained > 6 months ago.   NYHA Functional Classification: Class I  Heart Failure History:  Yes, Duration of heart failure since onset is > 9 months  Non-Ischemic Dilated Cardiomyopathy History:  Yes, timeframe is > 9 months  Atrial Fibrillation/Atrial Flutter:  No.  Ventricular Tachycardia History:  No.  Cardiac Arrest History:  No  History of Syndromes with Risk of Sudden Death:  No.  Previous ICD:  Yes, ICD Type:  CRT-D, Reason for ICD:  Primary prevention.  17%  Electrophysiology Study: No.  Prior MI: No.  PPM: No.  OSA:  No  Patient Life Expectancy of >=1 year: Yes.  Anticoagulation Therapy:  Patient is NOT on anticoagulation therapy.   Beta Blocker Therapy:  Yes.   Ace Inhibitor/ARB Therapy:  Yes.

## 2014-07-29 NOTE — H&P (View-Only) (Signed)
Patient ID: Priscilla Anderson, female   DOB: 01-18-1934, 80 y.o.   MRN: [id number]     Reason for office visit CRT-D at [facility]  Mrs. Vigilante's body ICD reached elective replacement indicator on August 20. She has increased her dietary intake of food and has/weight so that she is no longer severely underweight. Her BMI is increased to about 19. This has been associated with a slight increase in her blood pressure and her blood glucose so that she has had to increase her Januvia back to a full tablet. She is pacemaker dependent due to complete heart block. Chest had 4 surgical procedures in her left subclavian area and her left ventricular lead is epicardial. Her device has migrated caudally, into her breast, over at least 12-15 cm from her clavicle.  She has a long-standing history of nonischemic cardiomyopathy. She has complete heart block. Her initial CRT pacemaker was placed in 4098, complicated by late coronary sinus lead dislodgment in 2006. In 2007, it was upgraded to a biventricular ICD (Medtronic [place]) with an epicardial left ventricular lead in 2007 (Medtronic [id number]). 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 She has known occlusion of left subclavian vein. 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 She has a Medtronic Sprint Fidelis [phone] dual coil active fixation defibrillator lead that is under advisory. For this reason her old RV pacemaker lead (Medtronic [id number] implanted 2003) was used for pace-sense features, the Sprint Fidelis lead was left in place for tachycardia therapies only. The right atrial lead is the original one placed in 2003 (Medtronic [id number])  She has responded very well to cardiac resynchronization therapy. She has well compensated heart failure (NYHA functional class I) despite a very low left  ventricular ejection fraction, most recently estimated to be 25-30% by an echocardiogram in July of this 2014. She has moderate eccentric mitral insufficiency that is felt to be most likely secondary to cardiomyopathy. Coronary angiography in 2006 showed scattered mild to moderate coronary stenoses (40-50% LAD, 30% RCA, 2006). Her nuclear stress test in December of 2013 was interpreted as showing a large area of anterior scar and this has been seen on nuclear stress tests performed as far back as 2004. By scintigraphy left ventricular ejection fraction was only 17%.    No Known Allergies  Current Outpatient Prescriptions  Medication Sig Dispense Refill  . Ascorbic Acid (VITAMIN C) 500 MG tablet Take 500 mg by mouth daily.        Marland Kitchen aspirin 81 MG tablet Take 81 mg by mouth daily.        . Calcium Citrate-Vitamin D (CITRACAL/VITAMIN D) 250-200 MG-UNIT TABS Take by mouth daily.        . carvedilol (COREG) 12.5 MG tablet Take 1 tablet (12.5 mg total) by mouth 2 (two) times daily.  180 tablet  3  . diazepam (VALIUM) 5 MG tablet 1 by mouth at bedtime as needed  90 tablet  1  . digoxin (LANOXIN) 0.125 MG tablet Take 1 tablet (125 mcg total) by mouth daily.  90 tablet  3  . furosemide (LASIX) 40 MG tablet TAKE 1 TABLET EVERY OTHER DAY ALTERNATING WITH ONE AND ONE-HALF TABLETS EVERY OTHER DAY  135 tablet  3  . glucose blood (ACCU-CHEK AVIVA) test strip Test two times daily. Code 250.02  100 each  11  . ibandronate (BONIVA) 150 MG tablet TAKE 1 TABLET ONCE A MONTH.  3 tablet  6  . Lancets MISC Test as directed two times daily. Code 250.02  100 each  11  . losartan (COZAAR) 50 MG tablet Take 1 tablet (50 mg total) by mouth daily.  90 tablet  3  . metFORMIN (GLUCOPHAGE) 500 MG tablet take 1 tablet one time daily      . potassium chloride SA (KLOR-CON M20) 20 MEQ tablet Take 1 tablet (20 mEq total) by mouth daily.  90 tablet  3  . simvastatin (ZOCOR) 20 MG tablet Take 1 tablet (20 mg total) by mouth at  bedtime.  90 tablet  3  . sitaGLIPtin (JANUVIA) 100 MG tablet Take 100 mg by mouth daily.      Marland Kitchen spironolactone (ALDACTONE) 25 MG tablet Take 0.5 tablets (12.5 mg total) by mouth daily.  45 tablet  3  . Wheat Dextrin (BENEFIBER) POWD Take by mouth. 2 tablespoons once daily        No current facility-administered medications for this visit.    Past Medical History  Diagnosis Date  . ICD (implantable cardiac defibrillator) in place 09/22/2011  . Nonischemic cardiomyopathy 09/22/2011  . CHF (congestive heart failure)   . Diabetes mellitus     type II  . Hyperlipidemia   . Hypertension   . Osteopenia   . Adenomatous colon polyp   . Polyp of larynx   . CAD (coronary artery disease)     Past Surgical History  Procedure Laterality Date  . Cardiac defibrillator placement  05/21/2002    s/p re-do pacemaker/defibrillator October 2011- [place].  . Abdominal hysterectomy    . Left breast tumor resection  1980  . Colonoscopy    . Implantable cardioverter defibrillator generator change  09/01/2010    Medtronic  . Cardiac catheterization  07/28/2005    noncritical CAD    Family History  Problem Relation Age of Onset  . Arthritis Mother   . Arthritis Father   . Heart disease Brother     CABG  . Prostate cancer Brother     Prostate problem with up and down PSA  . Diabetes Other     1st degree    History   Social History  . Marital Status: Married    Spouse Name: N/A    Number of Children: 2  . Years of Education: N/A   Occupational History  . retired Investment banker, corporate. report clerk    Social History Main Topics  . Smoking status: Never Smoker   . Smokeless tobacco: Never Used  . Alcohol Use: No  . Drug Use: No  . Sexual Activity: Not on file   Other Topics Concern  . Not on file   Social History Narrative   Husband now with Multiple Myeloma- see Dr. Juanita Craver    Review of systems: The patient specifically denies any chest pain at rest or with  exertion, dyspnea at rest or with exertion, orthopnea, paroxysmal nocturnal dyspnea, syncope, palpitations, focal neurological deficits, intermittent claudication, lower extremity edema, unexplained weight gain, cough, hemoptysis or wheezing.  The patient also denies abdominal pain, nausea, vomiting, dysphagia, diarrhea, constipation, polyuria, polydipsia, dysuria, hematuria, frequency, urgency, abnormal bleeding or bruising, fever, chills, unexpected weight changes, mood swings, change in skin or hair texture, change in voice quality, auditory or visual problems, allergic reactions or rashes, new musculoskeletal complaints other than usual "aches and pains".   PHYSICAL EXAM BP 138/72  Pulse 80  Resp 16  Ht 5' 4"  (1.626 m)  Wt 113 lb 4.8 oz (51.393 kg)  BMI 19.44 kg/m2 General: Alert, oriented x3, no distress  Head: no evidence of trauma, PERRL, EOMI, no exophtalmos or lid lag, no myxedema, no xanthelasma; normal ears, nose and oropharynx  Neck: normal jugular venous pulsations and no hepatojugular reflux; brisk carotid pulses without delay and no carotid bruits  Chest: clear to auscultation, no signs of consolidation by percussion or palpation, normal fremitus, symmetrical and full respiratory excursions; the left subclavian ICD scar is healthy, the device has slipped into her breast. Collateral venous circulation is seen over the left anterior chest.  Cardiovascular: Inferolaterally displaced and heaving apical impulse, regular rhythm, normal first and paradoxically split second heart sounds, grade 0-2/6 whole systolic apical murmur , No rubs or gallops  Abdomen: no tenderness or distention, no masses by palpation, no abnormal pulsatility or arterial bruits, normal bowel sounds, no hepatosplenomegaly  Extremities: no clubbing, cyanosis or edema; 2+ radial, ulnar and brachial pulses bilaterally; 2+ right femoral, posterior tibial and dorsalis pedis pulses; 2+ left femoral, posterior tibial and  dorsalis pedis pulses; no subclavian or femoral bruits  Neurological: grossly nonfocal    EKG: atrial sensed biventricular paced    Lipid Panel     Component Value Date/Time   CHOL 145 03/04/2014 1434   TRIG 113.0 03/04/2014 1434   HDL 56.80 03/04/2014 1434   CHOLHDL 3 03/04/2014 1434   VLDL 22.6 03/04/2014 1434   LDLCALC 66 03/04/2014 1434    BMET    Component Value Date/Time   NA 139 03/04/2014 1434   K 4.2 03/04/2014 1434   CL 97 03/04/2014 1434   CO2 33* 03/04/2014 1434   GLUCOSE 111* 03/04/2014 1434   BUN 20 03/04/2014 1434   CREATININE 1.3* 03/04/2014 1434   CALCIUM 10.3 03/04/2014 1434   GFRNONAA 59.30 03/19/2010 1046   GFRAA 70 10/20/2008 0852     ASSESSMENT AND PLAN Chronic systolic congestive heart failure, NYHA class 1  She has excellent functional status despite severely depressed left ventricular systolic function. She has shown evidence of excellent response to CRT and deterioration when CRT was interrupted secondary to dislodgment years ago. No changes are made to her chronic medical therapy which includes fairly high dose of carvedilol, spironolactone, losartan, digoxin and a relatively low dose of loop diuretic.  She appears to be euvolemic by clinical evaluation and thoracic impedance measurements.   Biventricular ICD (implantable cardioverter-defibrillator) in place  Her device has migrated substantially. He generator change out the procedure will likely be lengthy. It'll be the fifth time that the pocket is opened. Her left ventricular lead is epicardial and attempts at re-accessing her coronary sinus after the previous dislodgment were unsuccessful. Her physical exam also suggests that the left subclavian vein is indeed occluded.  She is pacemaker dependent.  All these considerations lead to substantial concern about infection at the time of generator change out, which could have disastrous consequences in this patient. Would recommend placement of a Aigis pouch.  For  the same reason, although she has a defibrillator lead with potential risk for coil fracture, it is not advisable that the leads be replaced. At this point her defibrillator lead is used exclusively for shock therapy and not for pace/sense features. She has never required defibrillator therapy since device implantation.  All these considerations were discussed in detail with Mrs. Zendejas. I'm glad that she has put on a little weight, since this may  provide little if additional buffer when she receives a relatively large device with the added bulk of the Aigis pouch.  CORONARY ARTERY DISEASE  She does not have any symptoms of angina pectoris. She has moderate coronary atherosclerotic lesions by remote angiography. Her nuclear stress test in 2013 did not show areas of reversible ischemia. Risk factors are appropriate be addressed.   DIABETES MELLITUS, TYPE II  Avoid Actos.   HYPERLIPIDEMIA  Lipid parameters are all in the desirable range   Medtronic Consulta implanted October 2011  Right atrial lead Medtronic 5076 implanted 2003  Right ventricular lead ICD sprint Fidelis 6949 implanted 2011  Old RV pacemaker lead Medtronic 5076 implanted 2003 in use as a pace sense lead  Left ventricular lead epicardial Medtronic 5071 implanted 2007  Left subclavian device procedures performed in 2003, 2007, reexploration of pocket for hematoma 2007, 2011  Known occlusion of left subclavian vein by previous sonography  Orders Placed This Encounter  Procedures  . CBC  . Comprehensive metabolic panel  . APTT  . Protime-INR  . IMPLANTABLE CARDIOVERTER DEFIBRILLATOR (ICD) GENERATOR CHANGE   Meds ordered this encounter  Medications  . sitaGLIPtin (JANUVIA) 100 MG tablet    Sig: Take 100 mg by mouth daily.    Holli Humbles, MD, [place] [phone] office [phone] pager

## 2014-07-29 NOTE — Op Note (Signed)
Procedure report  Procedure performed:  1. CRT-D generator changeout  2. Light sedation  Reason for procedure:  1. Device generator at elective replacement interval  Procedure performed by:  Sanda Klein, MD  Complications:  None  Estimated blood loss:  <5 mL  Medications administered during procedure:  Ancef 2 g intravenously, lidocaine 1% 30 mL locally, fentanyl 25 mcg intravenously, Versed 1 mg intravenously Device details:   New Generator Medtronic Viva XT model number [id number], serial number [id number] Right atrial lead (chronic) Medtronic [id number], serial number [id number] (implanted 05/21/2002) Right ventricular ICD lead (chronic, pace-sense electrode capped)  Medtronic [id number], serial number [id number] (implanted 03/14/06) Right ventricular pace-sense lead  Medtronic 5076, serial number [id number] (implanted 05/21/2002) Left ventricular lead  Medtronic 5071-35 epicardial unipolar, serial number [id number] (implanted 03/16/06)  Explanted generator Medtronic Consulta CRT-D,  model number [id number], serial number  [id number] (implanted 09/01/2010)  Procedure details:  After the risks and benefits of the procedure were discussed the patient provided informed consent. She was brought to the cardiac catheter lab in the fasting state. The patient was prepped and draped in usual sterile fashion. Local anesthesia with 1% lidocaine was administered to to the left infraclavicular area.  The device had migrated caudally a substantial distance. A 5-6cm horizontal incision was made parallel with and 11-12 cm caudal to the left clavicle, in the area of an old scar. Three older scars were seen closer to the left clavicle. Using minimal electrocautery and mostly sharp and blunt dissection the prepectoral pocket was opened carefully to avoid injury to the loops of chronic leads. Extensive dissection was necessary, but the leads could not be freed from dense scar tissue.. The device was explanted. The  pocket was carefully inspected for hemostasis and flushed with copious amounts of antibiotic solution.  The leads were disconnected from the old generator and testing of the lead parameters showed excellent values. The new generator was connected to the chronic leads, with appropriate pacing noted. The device was placed in an AIGIS pouch.  The entire system was then carefully inserted in the pocket with care taken that the leads and device assumed a comfortable position without pressure on the incision. Great care was taken that the leads be located deep to the generator. The pocket was then closed in layers using 2 layers of 2-0 Vicryl and cutaneous staples after which a sterile dressing was applied.   At the end of the procedure the following lead parameters were encountered:   Right atrial lead sensed P waves 3.3 mV, impedance 399 ohms, threshold 0.75 at 0.4 ms pulse width.  Right ventricular lead sensed R waves  None detected, impedance 399 ohms, threshold 1.25 at 0.4   High voltage 4158 ohmms pulse width.  Left ventricular lead impedance 304 ohms, threshold 1.75 at 0.4 ms pulse width.  Sanda Klein, MD, [facility] [phone] office [phone] pager

## 2014-07-29 NOTE — Interval H&P Note (Signed)
History and Physical Interval Note:  07/29/2014 1:23 PM  Priscilla Anderson  has presented today for surgery, with the diagnosis of battery depletion  The various methods of treatment have been discussed with the patient and family. After consideration of risks, benefits and other options for treatment, the patient has consented to  Procedure(s): BIV ICD [place] (N/A) as a surgical intervention .  The patient's history has been reviewed, patient examined, no change in status, stable for surgery.  I have reviewed the patient's chart and labs.  Questions were answered to the patient's satisfaction.     Starlin Steib

## 2014-07-29 NOTE — Progress Notes (Signed)
This note also relates to the following rows which could not be included: CBG - Cannot attach notes to extension rows   Given a meal tray/ eating and drinking without complaint.

## 2014-07-29 NOTE — Discharge Instructions (Signed)
Pacemaker Battery Change, Care After °Refer to this sheet in the next few weeks. These instructions provide you with information on caring for yourself after your procedure. Your health care provider may also give you more specific instructions. Your treatment has been planned according to current medical practices, but problems sometimes occur. Call your health care provider if you have any problems or questions after your procedure. °WHAT TO EXPECT AFTER THE PROCEDURE °After your procedure, it is typical to have the following sensations: °· Soreness at the pacemaker site. °HOME CARE INSTRUCTIONS  °· Keep the incision clean and dry. °· Unless advised otherwise, you may shower beginning 48 hours after your procedure. °· For the first week after the replacement, avoid stretching motions that pull at the incision site, and avoid heavy exercise with the arm that is on the same side as the incision. °· Take medicines only as directed by your health care provider. °· Keep all follow-up visits as directed by your health care provider. °SEEK MEDICAL CARE IF:  °· You have pain at the incision site that is not relieved by over-the-counter or prescription medicine. °· There is drainage or pus from the incision site. °· There is swelling larger than a lime at the incision site. °· You develop red streaking that extends above or below the incision site. °· You feel brief, intermittent palpitations, light-headedness, or any symptoms that you feel might be related to your heart. °SEEK IMMEDIATE MEDICAL CARE IF:  °· You experience chest pain that is different than the pain at the pacemaker site. °· You experience shortness of breath. °· You have palpitations or irregular heartbeat. °· You have light-headedness that does not go away quickly. °· You faint. °· You have pain that gets worse and is not relieved by medicine. °Document Released: 08/21/2013 Document Revised: 03/17/2014 Document Reviewed: 08/21/2013 °[employer]® Patient  Information ©2015 [employer]. This information is not intended to replace advice given to you by your health care provider. Make sure you discuss any questions you have with your health care provider. ° °

## 2014-07-30 ENCOUNTER — Telehealth: Payer: Self-pay | Admitting: Cardiovascular Disease

## 2014-08-01 NOTE — Telephone Encounter (Signed)
Closed encounter °

## 2014-08-06 ENCOUNTER — Other Ambulatory Visit: Payer: Self-pay | Admitting: Internal Medicine

## 2014-08-10 ENCOUNTER — Other Ambulatory Visit: Payer: Self-pay | Admitting: Cardiovascular Disease

## 2014-08-11 NOTE — Telephone Encounter (Signed)
Rx was sent to pharmacy electronically. 

## 2014-08-15 ENCOUNTER — Ambulatory Visit (INDEPENDENT_AMBULATORY_CARE_PROVIDER_SITE_OTHER): Payer: Medicare Other | Admitting: *Deleted

## 2014-08-15 DIAGNOSIS — I429 Cardiomyopathy, unspecified: Secondary | ICD-10-CM | POA: Diagnosis not present

## 2014-08-15 DIAGNOSIS — I420 Dilated cardiomyopathy: Secondary | ICD-10-CM

## 2014-08-15 DIAGNOSIS — I5022 Chronic systolic (congestive) heart failure: Secondary | ICD-10-CM | POA: Diagnosis not present

## 2014-08-15 LAB — MDC_IDC_ENUM_SESS_TYPE_INCLINIC
Battery Remaining Longevity: 84 mo
Brady Statistic AP VS Percent: 0.03 %
Brady Statistic AS VS Percent: 1.73 %
Brady Statistic RA Percent Paced: 8.79 %
Brady Statistic RV Percent Paced: 98.04 %
HIGH POWER IMPEDANCE MEASURED VALUE: 190 Ohm
HIGH POWER IMPEDANCE MEASURED VALUE: 58 Ohm
HighPow Impedance: 39 Ohm
Lead Channel Impedance Value: 304 Ohm
Lead Channel Impedance Value: 4047 Ohm
Lead Channel Impedance Value: 418 Ohm
Lead Channel Pacing Threshold Amplitude: 0.5 V
Lead Channel Pacing Threshold Amplitude: 1.25 V
Lead Channel Pacing Threshold Amplitude: 1.5 V
Lead Channel Pacing Threshold Pulse Width: 0.4 ms
Lead Channel Pacing Threshold Pulse Width: 0.4 ms
Lead Channel Pacing Threshold Pulse Width: 0.4 ms
Lead Channel Sensing Intrinsic Amplitude: 2.875 mV
Lead Channel Sensing Intrinsic Amplitude: 8.75 mV
Lead Channel Setting Pacing Amplitude: 1.5 V
Lead Channel Setting Pacing Pulse Width: 0.4 ms
MDC IDC MSMT BATTERY VOLTAGE: 3.13 V
MDC IDC MSMT LEADCHNL LV IMPEDANCE VALUE: 4047 Ohm
MDC IDC MSMT LEADCHNL RA IMPEDANCE VALUE: 418 Ohm
MDC IDC MSMT LEADCHNL RA SENSING INTR AMPL: 3.75 mV
MDC IDC SESS DTM: 20151002145050
MDC IDC SET LEADCHNL LV PACING AMPLITUDE: 2.5 V
MDC IDC SET LEADCHNL LV PACING PULSEWIDTH: 0.4 ms
MDC IDC SET LEADCHNL RV PACING AMPLITUDE: 2.5 V
MDC IDC SET LEADCHNL RV SENSING SENSITIVITY: 0.3 mV
MDC IDC SET ZONE DETECTION INTERVAL: 300 ms
MDC IDC SET ZONE DETECTION INTERVAL: 350 ms
MDC IDC SET ZONE DETECTION INTERVAL: 450 ms
MDC IDC STAT BRADY AP VP PERCENT: 8.76 %
MDC IDC STAT BRADY AS VP PERCENT: 89.49 %
Zone Setting Detection Interval: 370 ms

## 2014-08-15 NOTE — Progress Notes (Signed)
Wound check Bi V ICD in office.

## 2014-08-19 ENCOUNTER — Ambulatory Visit: Payer: Medicare Other | Admitting: Cardiology

## 2014-08-21 ENCOUNTER — Encounter: Payer: Self-pay | Admitting: Cardiovascular Disease

## 2014-08-25 DIAGNOSIS — Z23 Encounter for immunization: Secondary | ICD-10-CM | POA: Diagnosis not present

## 2014-09-02 ENCOUNTER — Encounter: Payer: Self-pay | Admitting: Internal Medicine

## 2014-09-02 ENCOUNTER — Ambulatory Visit (INDEPENDENT_AMBULATORY_CARE_PROVIDER_SITE_OTHER): Payer: Medicare Other | Admitting: Internal Medicine

## 2014-09-02 ENCOUNTER — Other Ambulatory Visit (INDEPENDENT_AMBULATORY_CARE_PROVIDER_SITE_OTHER): Payer: Medicare Other

## 2014-09-02 VITALS — BP 122/82 | HR 93 | Temp 98.2°F | Wt 113.2 lb

## 2014-09-02 DIAGNOSIS — I2510 Atherosclerotic heart disease of native coronary artery without angina pectoris: Secondary | ICD-10-CM

## 2014-09-02 DIAGNOSIS — E119 Type 2 diabetes mellitus without complications: Secondary | ICD-10-CM

## 2014-09-02 DIAGNOSIS — E785 Hyperlipidemia, unspecified: Secondary | ICD-10-CM | POA: Diagnosis not present

## 2014-09-02 DIAGNOSIS — N899 Noninflammatory disorder of vagina, unspecified: Secondary | ICD-10-CM | POA: Diagnosis not present

## 2014-09-02 DIAGNOSIS — I10 Essential (primary) hypertension: Secondary | ICD-10-CM | POA: Diagnosis not present

## 2014-09-02 DIAGNOSIS — M810 Age-related osteoporosis without current pathological fracture: Secondary | ICD-10-CM

## 2014-09-02 LAB — HEPATIC FUNCTION PANEL
ALT: 17 U/L (ref 0–35)
AST: 22 U/L (ref 0–37)
Albumin: 4.2 g/dL (ref 3.5–5.2)
Alkaline Phosphatase: 48 U/L (ref 39–117)
Bilirubin, Direct: 0 mg/dL (ref 0.0–0.3)
TOTAL PROTEIN: 8.2 g/dL (ref 6.0–8.3)
Total Bilirubin: 0.6 mg/dL (ref 0.2–1.2)

## 2014-09-02 LAB — LIPID PANEL
CHOL/HDL RATIO: 3
Cholesterol: 137 mg/dL (ref 0–200)
HDL: 46.7 mg/dL (ref >39.00)
LDL Cholesterol: 62 mg/dL (ref 0–99)
NonHDL: 90.3
Triglycerides: 140 mg/dL (ref 0.0–149.0)
VLDL: 28 mg/dL (ref 0.0–40.0)

## 2014-09-02 LAB — BASIC METABOLIC PANEL
BUN: 17 mg/dL (ref 6–23)
CHLORIDE: 102 meq/L (ref 96–112)
CO2: 30 meq/L (ref 19–32)
CREATININE: 1.3 mg/dL — AB (ref 0.4–1.2)
Calcium: 10.7 mg/dL — ABNORMAL HIGH (ref 8.4–10.5)
GFR: 49.12 mL/min — ABNORMAL LOW (ref >60.00)
Glucose, Bld: 104 mg/dL — ABNORMAL HIGH (ref 70–99)
Potassium: 4.8 mEq/L (ref 3.5–5.1)
Sodium: 142 mEq/L (ref 135–145)

## 2014-09-02 LAB — HEMOGLOBIN A1C: HEMOGLOBIN A1C: 6.8 % — AB (ref 4.6–6.5)

## 2014-09-02 NOTE — Progress Notes (Signed)
Pre visit review using our clinic review tool, if applicable. No additional management support is needed unless otherwise documented below in the visit note. 

## 2014-09-02 NOTE — Assessment & Plan Note (Signed)
?   Cervical prolapse vs other - for GYn referral

## 2014-09-02 NOTE — Assessment & Plan Note (Signed)
stable overall by history and exam, recent data reviewed with pt, and pt to continue medical treatment as before,  to f/u any worsening symptoms or concerns Lab Results  Component Value Date   LDLCALC 66 03/04/2014

## 2014-09-02 NOTE — Progress Notes (Signed)
Subjective:    Patient ID: Priscilla Anderson, female    DOB: 12/14/1933, 80 y.o.   MRN: [id number]  HPI  Here for yearly f/u;  Overall doing ok;  Pt denies CP, worsening SOB, DOE, wheezing, orthopnea, PND, worsening LE edema, palpitations, dizziness or syncope.  Pt denies neurological change such as new headache, facial or extremity weakness.  Pt denies polydipsia, polyuria, or low sugar symptoms. Pt states overall good compliance with treatment and medications, good tolerability, and has been trying to follow lower cholesterol diet.  Pt denies worsening depressive symptoms, suicidal ideation or panic. No fever, night sweats, wt loss, loss of appetite, or other constitutional symptoms.  Pt states good ability with ADL's, has low fall risk, home safety reviewed and adequate, no other significant changes in hearing or vision, and occasionally active with exercise.  CBG' in low 100's albeit slightly higher than previous as her wt has increased.  Wt up to 113 from 108 last visit with less stringent diet, very strict with herself with DM diet.  Now taking the whole januvia as well. hsa been taking half at last visit.  Also has a painless fullness to the vaginal area, d/w a family member who is GYN, tried kegal excercises, but still present. Now concerned with prolapse organ - asks for GYN referral.  Mother died at 65yo in 03/06/2015with pna, then brother died 2014/04/19 with prostate ca.   Past Medical History  Diagnosis Date  . ICD (implantable cardiac defibrillator) in place 09/22/2011  . Nonischemic cardiomyopathy 09/22/2011  . CHF (congestive heart failure)   . Diabetes mellitus     type II  . Hyperlipidemia   . Hypertension   . Osteopenia   . Adenomatous colon polyp   . Polyp of larynx   . CAD (coronary artery disease)    Past Surgical History  Procedure Laterality Date  . Cardiac defibrillator placement  05/21/2002    s/p re-do pacemaker/defibrillator October 2011- [place].  . Abdominal  hysterectomy    . Left breast tumor resection  1980  . Colonoscopy    . Implantable cardioverter defibrillator generator change  09/01/2010    [employer]  . Cardiac catheterization  07/28/2005    noncritical CAD    reports that she has never smoked. She has never used smokeless tobacco. She reports that she does not drink alcohol or use illicit drugs. family history includes Arthritis in her father and mother; Diabetes in her other; Heart disease in her brother; Prostate cancer in her brother. No Known Allergies Current Outpatient Prescriptions on File Prior to Visit  Medication Sig Dispense Refill  . Artificial Tear Solution (SOOTHE XP) SOLN Apply 1 drop to eye daily as needed (dry eyes).      . Ascorbic Acid (VITAMIN C) 500 MG tablet Take 500 mg by mouth daily.        Marland Kitchen aspirin 81 MG tablet Take 81 mg by mouth daily.        . Calcium Citrate-Vitamin D (CITRACAL/VITAMIN D) 250-200 MG-UNIT TABS Take 1 tablet by mouth daily.       . carvedilol (COREG) 12.5 MG tablet Take 1 tablet (12.5 mg total) by mouth 2 (two) times daily.  180 tablet  3  . diazepam (VALIUM) 5 MG tablet Take 5 mg by mouth at bedtime as needed for anxiety.      . digoxin (LANOXIN) 0.125 MG tablet Take 1 tablet (125 mcg total) by mouth daily.  90 tablet  3  . furosemide (LASIX) 40 MG tablet Take 40-60 mg by mouth daily. Alternating between 40 mg & 60 mg every other day      . glucose blood (ACCU-CHEK AVIVA) test strip Test two times daily. Code 250.02  100 each  11  . ibandronate (BONIVA) 150 MG tablet Take 150 mg by mouth every 30 (thirty) days. Take in the morning with a full glass of water, on an empty stomach, and do not take anything else by mouth or lie down for the next 30 min.      Marland Kitchen JANUVIA 100 MG tablet TAKE 1 TABLET DAILY  90 tablet  2  . Lancets MISC Test as directed two times daily. Code 250.02  100 each  11  . losartan (COZAAR) 50 MG tablet Take 1 tablet (50 mg total) by mouth daily.  90 tablet  3  . metFORMIN  (GLUCOPHAGE) 500 MG tablet Take 1,000 mg by mouth daily with breakfast.       . potassium chloride SA (K-DUR,KLOR-CON) 20 MEQ tablet TAKE 1 TABLET DAILY  90 tablet  3  . simvastatin (ZOCOR) 20 MG tablet Take 1 tablet (20 mg total) by mouth at bedtime.  90 tablet  3  . spironolactone (ALDACTONE) 25 MG tablet Take 0.5 tablets (12.5 mg total) by mouth daily.  45 tablet  3  . Wheat Dextrin (BENEFIBER) POWD Take by mouth. 2 tablespoons once daily      . White Petrolatum-Mineral Oil (SYSTANE NIGHTTIME OP) Apply 1 drop to eye at bedtime as needed (dry eyes).       No current facility-administered medications on file prior to visit.    Review of Systems Constitutional: Negative for increased diaphoresis, other activity, appetite or other siginficant weight change  HENT: Negative for worsening hearing loss, ear pain, facial swelling, mouth sores and neck stiffness.   Eyes: Negative for other worsening pain, redness or visual disturbance.  Respiratory: Negative for shortness of breath and wheezing.   Cardiovascular: Negative for chest pain and palpitations.  Gastrointestinal: Negative for diarrhea, blood in stool, abdominal distention or other pain Genitourinary: Negative for hematuria, flank pain or change in urine volume.  Musculoskeletal: Negative for myalgias or other joint complaints.  Skin: Negative for color change and wound.  Neurological: Negative for syncope and numbness. other than noted Hematological: Negative for adenopathy. or other swelling Psychiatric/Behavioral: Negative for hallucinations, self-injury, decreased concentration or other worsening agitation.      Objective:   Physical Exam BP 122/82  Pulse 93  Temp(Src) 98.2 F (36.8 C) (Oral)  Wt 113 lb 4 oz (51.37 kg)  SpO2 96% VS noted,  Constitutional: Pt is oriented to person, place, and time. Appears well-developed and well-nourished.  Head: Normocephalic and atraumatic.  Right Ear: External ear normal.  Left Ear:  External ear normal.  Nose: Nose normal.  Mouth/Throat: Oropharynx is clear and moist.  Eyes: Conjunctivae and EOM are normal. Pupils are equal, round, and reactive to light.  Neck: Normal range of motion. Neck supple. No JVD present. No tracheal deviation present.  Cardiovascular: Normal rate, regular rhythm, normal heart sounds and intact distal pulses.   Pulmonary/Chest: Effort normal and breath sounds without rales or wheezing  Abdominal: Soft. Bowel sounds are normal. NT. No HSM  Musculoskeletal: Normal range of motion. Exhibits no edema.  Lymphadenopathy:  Has no cervical adenopathy.  Neurological: Pt is alert and oriented to person, place, and time. Pt has normal reflexes. No cranial nerve deficit. Motor grossly intact Skin:  Skin is warm and dry. No rash noted. Left chest device pocket incision intact, minor tender, no swelling Psychiatric:  Has normal mood and affect. Behavior is normal.  Wt Readings from Last 3 Encounters:  09/02/14 113 lb 4 oz (51.37 kg)  07/29/14 113 lb (51.256 kg)  07/29/14 113 lb (51.256 kg)       Assessment & Plan:

## 2014-09-02 NOTE — Patient Instructions (Addendum)
Please schedule the bone density test before leaving today at the scheduling desk (where you check out)  Please continue all other medications as before, and refills have been done if requested.  Please have the pharmacy call with any other refills you may need.  Please continue your efforts at being more active, low cholesterol diet, and weight control.  You are otherwise up to date with prevention measures today.   You will be contacted regarding the referral for: GYN  Please keep your appointments with your specialists as you may have planned  Please go to the LAB in the Basement (turn left off the elevator) for the tests to be done today  You will be contacted by phone if any changes need to be made immediately.  Otherwise, you will receive a letter about your results with an explanation, but please check with MyChart first.  Please remember to sign up for MyChart if you have not done so, as this will be important to you in the future with finding out test results, communicating by private email, and scheduling acute appointments online when needed.  Please return in 6 months, or sooner if needed

## 2014-09-02 NOTE — Assessment & Plan Note (Signed)
Due for dxa f/u, for scheduling

## 2014-09-02 NOTE — Assessment & Plan Note (Signed)
stable overall by history and exam, recent data reviewed with pt, and pt to continue medical treatment as before,  to f/u any worsening symptoms or concerns BP Readings from Last 3 Encounters:  09/02/14 122/82  07/29/14 116/75  07/29/14 116/75

## 2014-09-02 NOTE — Assessment & Plan Note (Signed)
stable overall by history and exam, recent data reviewed with pt, and pt to continue medical treatment as before,  to f/u any worsening symptoms or concerns Lab Results  Component Value Date   HGBA1C 6.3 03/04/2014

## 2014-09-03 ENCOUNTER — Telehealth: Payer: Self-pay | Admitting: Internal Medicine

## 2014-09-03 ENCOUNTER — Ambulatory Visit (INDEPENDENT_AMBULATORY_CARE_PROVIDER_SITE_OTHER)
Admission: RE | Admit: 2014-09-03 | Discharge: 2014-09-03 | Disposition: A | Discharge status: Home / Self Care | Payer: Medicare Other | Source: Ambulatory Visit | Attending: Internal Medicine | Admitting: Internal Medicine

## 2014-09-03 DIAGNOSIS — M810 Age-related osteoporosis without current pathological fracture: Secondary | ICD-10-CM

## 2014-09-03 NOTE — Telephone Encounter (Signed)
emmi mailed  °

## 2014-09-04 ENCOUNTER — Encounter: Payer: Self-pay | Admitting: Internal Medicine

## 2014-09-15 ENCOUNTER — Other Ambulatory Visit: Payer: Self-pay | Admitting: Cardiovascular Disease

## 2014-09-16 ENCOUNTER — Telehealth: Payer: Self-pay | Admitting: Internal Medicine

## 2014-09-16 NOTE — Telephone Encounter (Signed)
Patient would like to speak with you about something she spoke with you about on Oct 20th.

## 2014-09-16 NOTE — Telephone Encounter (Signed)
Called the patient and she was following up on referral PCP made to OBGYN on 09/02/14 OV.  Patient has not heard back on appt.  Did inform her information had been sent to [facility] awaiting appt.  Did forward information to [facility] to followup asap for appt. For this patient and she will call the patient back once has knowledge of appt.

## 2014-09-24 ENCOUNTER — Encounter: Payer: Self-pay | Admitting: Cardiovascular Disease

## 2014-09-24 DIAGNOSIS — N8111 Cystocele, midline: Secondary | ICD-10-CM | POA: Diagnosis not present

## 2014-10-12 ENCOUNTER — Other Ambulatory Visit: Payer: Self-pay | Admitting: Cardiovascular Disease

## 2014-10-13 NOTE — Telephone Encounter (Signed)
Rx has been sent to the pharmacy electronically. ° °

## 2014-10-14 ENCOUNTER — Telehealth: Payer: Self-pay | Admitting: Internal Medicine

## 2014-10-14 NOTE — Telephone Encounter (Signed)
Scheduled patient for Thursday to come in.  She did not want to be sent to the nurse line.  She states her blood sugar ran high around 228 last week and she has had loose stole.  Can you please follow up with patient.

## 2014-10-14 NOTE — Telephone Encounter (Signed)
Ok to see on thur unless having worsneing fever, pain, diarrhea or blood.

## 2014-10-14 NOTE — Telephone Encounter (Signed)
Notified patient.  States she does not have any of these symptoms.

## 2014-10-16 ENCOUNTER — Encounter: Payer: Self-pay | Admitting: Internal Medicine

## 2014-10-16 ENCOUNTER — Ambulatory Visit (INDEPENDENT_AMBULATORY_CARE_PROVIDER_SITE_OTHER): Payer: Medicare Other | Admitting: Internal Medicine

## 2014-10-16 VITALS — BP 108/72 | HR 88 | Temp 98.9°F | Wt 113.2 lb

## 2014-10-16 DIAGNOSIS — E119 Type 2 diabetes mellitus without complications: Secondary | ICD-10-CM | POA: Diagnosis not present

## 2014-10-16 DIAGNOSIS — I10 Essential (primary) hypertension: Secondary | ICD-10-CM

## 2014-10-16 DIAGNOSIS — I2510 Atherosclerotic heart disease of native coronary artery without angina pectoris: Secondary | ICD-10-CM | POA: Diagnosis not present

## 2014-10-16 DIAGNOSIS — E785 Hyperlipidemia, unspecified: Secondary | ICD-10-CM

## 2014-10-16 NOTE — Progress Notes (Signed)
Pre visit review using our clinic review tool, if applicable. No additional management support is needed unless otherwise documented below in the visit note. 

## 2014-10-16 NOTE — Patient Instructions (Addendum)
OK to take 2 of the metformin per day with the [place] for the next 5 days, then cut back again to 1 metformin per day with the [place]  Please continue to monitor your blood sugars as you do, as they should come back to usual levels after your viral illness wears off soon  Please continue all other medications as before, and refills have been done if requested.  Please have the pharmacy call with any other refills you may need..  Please keep your appointments with your specialists as you may have planned

## 2014-10-16 NOTE — Progress Notes (Signed)
Subjective:    Patient ID: Priscilla Anderson, female    DOB: [birth year], 80 y.o.   MRN: [id number]  HPI  Here with above, had recent viral sounding URI symtpoms with churning abd crampy pains and loose stools, assoc with mild increased sugars in the past wk near 200, does not seem to be improving.  Has taken 2 metformin in the AM to help this, but now would like to know if this was OK.  Pt denies chest pain, increased sob or doe, wheezing, orthopnea, PND, increased LE swelling, palpitations, dizziness or syncope.  Pt denies new neurological symptoms such as new headache, or facial or extremity weakness or numbness  Seen per Dr Julieta Bellini  - has cystocele grade 2 - for kegals, avoid pelvic floor pressure, avoid bladder overfilling, avoid surgury for now if possible.  Past Medical History  Diagnosis Date  . ICD (implantable cardiac defibrillator) in place 09/22/2011  . Nonischemic cardiomyopathy 09/22/2011  . CHF (congestive heart failure)   . Diabetes mellitus     type II  . Hyperlipidemia   . Hypertension   . Osteopenia   . Adenomatous colon polyp   . Polyp of larynx   . CAD (coronary artery disease)    Past Surgical History  Procedure Laterality Date  . Cardiac defibrillator placement  05/21/2002    s/p re-do pacemaker/defibrillator October 2011- [place].  . Abdominal hysterectomy    . Left breast tumor resection  1980  . Colonoscopy    . Implantable cardioverter defibrillator generator change  09/01/2010    [employer]  . Cardiac catheterization  07/28/2005    noncritical CAD    reports that she has never smoked. She has never used smokeless tobacco. She reports that she does not drink alcohol or use illicit drugs. family history includes Arthritis in her father and mother; Diabetes in her other; Heart disease in her brother; Prostate cancer in her brother. No Known Allergies Current Outpatient Prescriptions on File Prior to Visit  Medication Sig Dispense Refill  . Artificial  Tear Solution (SOOTHE XP) SOLN Apply 1 drop to eye daily as needed (dry eyes).    . Ascorbic Acid (VITAMIN C) 500 MG tablet Take 500 mg by mouth daily.      Marland Kitchen aspirin 81 MG tablet Take 81 mg by mouth daily.      . Calcium Citrate-Vitamin D (CITRACAL/VITAMIN D) 250-200 MG-UNIT TABS Take 1 tablet by mouth daily.     . carvedilol (COREG) 12.5 MG tablet TAKE 1 TABLET TWICE A DAY 180 tablet 2  . diazepam (VALIUM) 5 MG tablet Take 5 mg by mouth at bedtime as needed for anxiety.    . furosemide (LASIX) 40 MG tablet Take 40-60 mg by mouth daily. Alternating between 40 mg & 60 mg every other day    . glucose blood (ACCU-CHEK AVIVA) test strip Test two times daily. Code 250.02 100 each 11  . ibandronate (BONIVA) 150 MG tablet Take 150 mg by mouth every 30 (thirty) days. Take in the morning with a full glass of water, on an empty stomach, and do not take anything else by mouth or lie down for the next 30 min.    Marland Kitchen JANUVIA 100 MG tablet TAKE 1 TABLET DAILY 90 tablet 2  . Lancets MISC Test as directed two times daily. Code 250.02 100 each 11  . losartan (COZAAR) 50 MG tablet Take 1 tablet (50 mg total) by mouth daily. 90 tablet 3  . metFORMIN (GLUCOPHAGE)  500 MG tablet Take 1,000 mg by mouth daily with breakfast.     . potassium chloride SA (K-DUR,KLOR-CON) 20 MEQ tablet TAKE 1 TABLET DAILY 90 tablet 3  . simvastatin (ZOCOR) 20 MG tablet TAKE 1 TABLET AT BEDTIME 30 tablet 0  . spironolactone (ALDACTONE) 25 MG tablet TAKE ONE-HALF TABLET (12.5 MG) DAILY 45 tablet 2  . Wheat Dextrin (BENEFIBER) POWD Take by mouth. 2 tablespoons once daily    . White Petrolatum-Mineral Oil (SYSTANE NIGHTTIME OP) Apply 1 drop to eye at bedtime as needed (dry eyes).     No current facility-administered medications on file prior to visit.     Review of Systems  Constitutional: Negative for unusual diaphoresis or other sweats  HENT: Negative for ringing in ear Eyes: Negative for double vision or worsening visual disturbance.    Respiratory: Negative for choking and stridor.   Gastrointestinal: Negative for vomiting or other signifcant bowel change Genitourinary: Negative for hematuria or decreased urine volume.  Musculoskeletal: Negative for other MSK pain or swelling Skin: Negative for color change and worsening wound.  Neurological: Negative for tremors and numbness other than noted  Psychiatric/Behavioral: Negative for decreased concentration or agitation other than above       Objective:   Physical Exam BP 108/72 mmHg  Pulse 88  Temp(Src) 98.9 F (37.2 C) (Oral)  Wt 113 lb 4 oz (51.37 kg)  SpO2 99% VS noted, not ill appaering Constitutional: Pt appears well-developed, well-nourished.  HENT: Head: NCAT.  Right Ear: External ear normal.  Left Ear: External ear normal.  Eyes: . Pupils are equal, round, and reactive to light. Conjunctivae and EOM are normal Neck: Normal range of motion. Neck supple.  Bilat tm's with mild erythema.  Max sinus areas non tender.  Pharynx with mild erythema, no exudate Cardiovascular: Normal rate and regular rhythm.   Pulmonary/Chest: Effort normal and breath sounds normal.  Abd:  Soft, NT, ND, + BS Neurological: Pt is alert. Not confused , motor grossly intact Skin: Skin is warm. No rash Psychiatric: Pt behavior is normal. No agitation.      Assessment & Plan:

## 2014-10-17 ENCOUNTER — Other Ambulatory Visit: Payer: Self-pay | Admitting: Cardiovascular Disease

## 2014-10-17 NOTE — Telephone Encounter (Signed)
Rx was sent to pharmacy electronically. 

## 2014-10-18 NOTE — Assessment & Plan Note (Signed)
stable overall by history and exam, recent data reviewed with pt, and pt to continue medical treatment as before,  to f/u any worsening symptoms or concerns BP Readings from Last 3 Encounters:  10/16/14 108/72  09/02/14 122/82  07/29/14 116/75

## 2014-10-18 NOTE — Assessment & Plan Note (Signed)
stable overall by history and exam, recent data reviewed with pt, and pt to continue medical treatment as before,  to f/u any worsening symptoms or concerns Lab Results  Component Value Date   LDLCALC 62 09/02/2014

## 2014-10-18 NOTE — Assessment & Plan Note (Signed)
Ok to take the 2 metformin per day prn cbg > 150, should resolve itself in the next wk or so,  to f/u any worsening symptoms or concerns, cont all other meds and diet

## 2014-10-21 ENCOUNTER — Other Ambulatory Visit: Payer: Self-pay | Admitting: Cardiovascular Disease

## 2014-10-21 NOTE — Telephone Encounter (Signed)
Rx was sent to pharmacy electronically. 

## 2014-10-23 ENCOUNTER — Encounter (HOSPITAL_COMMUNITY): Payer: Self-pay | Admitting: Cardiovascular Disease

## 2014-10-28 ENCOUNTER — Encounter: Payer: Self-pay | Admitting: Cardiovascular Disease

## 2014-10-28 ENCOUNTER — Ambulatory Visit (INDEPENDENT_AMBULATORY_CARE_PROVIDER_SITE_OTHER): Payer: Medicare Other | Admitting: Cardiovascular Disease

## 2014-10-28 VITALS — BP 120/60 | HR 84 | Resp 16 | Ht 64.5 in | Wt 110.1 lb

## 2014-10-28 DIAGNOSIS — R7309 Other abnormal glucose: Secondary | ICD-10-CM | POA: Diagnosis not present

## 2014-10-28 DIAGNOSIS — R3919 Other difficulties with micturition: Secondary | ICD-10-CM | POA: Diagnosis not present

## 2014-10-28 DIAGNOSIS — T460X4A Poisoning by cardiac-stimulant glycosides and drugs of similar action, undetermined, initial encounter: Secondary | ICD-10-CM | POA: Diagnosis not present

## 2014-10-28 DIAGNOSIS — R7303 Prediabetes: Secondary | ICD-10-CM

## 2014-10-28 DIAGNOSIS — Z79899 Other long term (current) drug therapy: Secondary | ICD-10-CM

## 2014-10-28 DIAGNOSIS — Z95810 Presence of automatic (implantable) cardiac defibrillator: Secondary | ICD-10-CM

## 2014-10-28 DIAGNOSIS — R5383 Other fatigue: Secondary | ICD-10-CM

## 2014-10-28 DIAGNOSIS — I429 Cardiomyopathy, unspecified: Secondary | ICD-10-CM

## 2014-10-28 DIAGNOSIS — I420 Dilated cardiomyopathy: Secondary | ICD-10-CM

## 2014-10-28 DIAGNOSIS — I5022 Chronic systolic (congestive) heart failure: Secondary | ICD-10-CM | POA: Diagnosis not present

## 2014-10-28 DIAGNOSIS — R39198 Other difficulties with micturition: Secondary | ICD-10-CM

## 2014-10-28 DIAGNOSIS — E785 Hyperlipidemia, unspecified: Secondary | ICD-10-CM

## 2014-10-28 LAB — MDC_IDC_ENUM_SESS_TYPE_INCLINIC
Brady Statistic AP VS Percent: 0.02 %
Brady Statistic AS VS Percent: 0.76 %
Brady Statistic RV Percent Paced: 99.06 %
HIGH POWER IMPEDANCE MEASURED VALUE: 59 Ohm
HighPow Impedance: 190 Ohm
HighPow Impedance: 40 Ohm
Lead Channel Impedance Value: 304 Ohm
Lead Channel Impedance Value: 4047 Ohm
Lead Channel Impedance Value: 4047 Ohm
Lead Channel Impedance Value: 418 Ohm
Lead Channel Impedance Value: 418 Ohm
Lead Channel Pacing Threshold Amplitude: 0.625 V
Lead Channel Pacing Threshold Amplitude: 1.125 V
Lead Channel Pacing Threshold Amplitude: 1.625 V
Lead Channel Pacing Threshold Pulse Width: 0.4 ms
Lead Channel Sensing Intrinsic Amplitude: 2.625 mV
Lead Channel Sensing Intrinsic Amplitude: 8.75 mV
Lead Channel Setting Pacing Amplitude: 2 V
Lead Channel Setting Pacing Amplitude: 2.5 V
Lead Channel Setting Pacing Amplitude: 2.5 V
Lead Channel Setting Pacing Pulse Width: 0.4 ms
Lead Channel Setting Pacing Pulse Width: 0.4 ms
MDC IDC MSMT BATTERY REMAINING LONGEVITY: 81 mo
MDC IDC MSMT BATTERY VOLTAGE: 3.03 V
MDC IDC MSMT LEADCHNL RA PACING THRESHOLD PULSEWIDTH: 0.4 ms
MDC IDC MSMT LEADCHNL RA SENSING INTR AMPL: 3.5 mV
MDC IDC MSMT LEADCHNL RV PACING THRESHOLD PULSEWIDTH: 0.4 ms
MDC IDC SESS DTM: 20151215151252
MDC IDC SET LEADCHNL RV SENSING SENSITIVITY: 0.3 mV
MDC IDC SET ZONE DETECTION INTERVAL: 300 ms
MDC IDC SET ZONE DETECTION INTERVAL: 350 ms
MDC IDC STAT BRADY AP VP PERCENT: 6.32 %
MDC IDC STAT BRADY AS VP PERCENT: 92.89 %
MDC IDC STAT BRADY RA PERCENT PACED: 6.34 %
Zone Setting Detection Interval: 370 ms
Zone Setting Detection Interval: 450 ms

## 2014-10-28 NOTE — Patient Instructions (Addendum)
Remote monitoring is used to monitor your ICD from home. This monitoring reduces the number of office visits required to check your device to one time per year. It allows [place] to keep an eye on the functioning of your device to ensure it is working properly. You are scheduled for a device check from home on 01-28-2015. You may send your transmission at any time that day. If you have a wireless device, the transmission will be sent automatically. After your physician reviews your transmission, you will receive a postcard with your next transmission date.  Your physician recommends that you schedule a follow-up appointment in: 6 months with Dr.Croitoru  Your physician recommends that you return for lab work

## 2014-10-29 ENCOUNTER — Encounter: Payer: Self-pay | Admitting: Cardiovascular Disease

## 2014-10-29 LAB — CBC
HCT: 30.3 % — ABNORMAL LOW (ref 36.0–46.0)
Hemoglobin: 10.1 g/dL — ABNORMAL LOW (ref 12.0–15.0)
MCH: 30.6 pg (ref 26.0–34.0)
MCHC: 33.3 g/dL (ref 30.0–36.0)
MCV: 91.8 fL (ref 78.0–100.0)
MPV: 10.9 fL (ref 9.4–12.4)
PLATELETS: 338 10*3/uL (ref 150–400)
RBC: 3.3 MIL/uL — ABNORMAL LOW (ref 3.87–5.11)
RDW: 13.7 % (ref 11.5–15.5)
WBC: 13.7 10*3/uL — AB (ref 4.0–10.5)

## 2014-10-29 LAB — COMPREHENSIVE METABOLIC PANEL
ALT: <8 U/L (ref 0–35)
AST: 13 U/L (ref 0–37)
Albumin: 3.7 g/dL (ref 3.5–5.2)
Alkaline Phosphatase: 52 U/L (ref 39–117)
BILIRUBIN TOTAL: 0.5 mg/dL (ref 0.2–1.2)
BUN: 31 mg/dL — AB (ref 6–23)
CO2: 29 mEq/L (ref 19–32)
Calcium: 9.4 mg/dL (ref 8.4–10.5)
Chloride: 97 mEq/L (ref 96–112)
Creat: 1.4 mg/dL — ABNORMAL HIGH (ref 0.50–1.10)
GLUCOSE: 163 mg/dL — AB (ref 70–99)
POTASSIUM: 4.5 meq/L (ref 3.5–5.3)
SODIUM: 136 meq/L (ref 135–145)
Total Protein: 6.8 g/dL (ref 6.0–8.3)

## 2014-10-29 LAB — HEMOGLOBIN A1C
Hgb A1c MFr Bld: 7.4 % — ABNORMAL HIGH (ref <5.7)
MEAN PLASMA GLUCOSE: 166 mg/dL — AB (ref <117)

## 2014-10-29 LAB — LIPID PANEL
CHOLESTEROL: 116 mg/dL (ref 0–200)
HDL: 44 mg/dL (ref >39)
LDL Cholesterol: 52 mg/dL (ref 0–99)
Total CHOL/HDL Ratio: 2.6 Ratio
Triglycerides: 98 mg/dL (ref <150)
VLDL: 20 mg/dL (ref 0–40)

## 2014-10-29 LAB — TSH: TSH: 1.125 u[IU]/mL (ref 0.350–4.500)

## 2014-10-29 NOTE — Progress Notes (Signed)
Patient ID: Priscilla Anderson, female   DOB: May 06, 1934, 80 y.o.   MRN: [id number]     Reason for office visit CRT-D at [facility]  This is Priscilla Anderson's first office visit since her BiV ICD generator replacement in September. Her weight loss has stabilized, but he has not really gained any weight back. Her BMI is just under 19. Her blood glucose has recently become harder to control.  Today she has complaints of unpleasant abdominal sensation, but no cardiovascular complaints.  Comprehensive in office check shows good results. Thresholds and sensing consistent with previous device measurements. Lead impedance trends stable over time. No mode switch episodes recorded. No sustained ventricular arrhythmia episodes recorded. 3 "VS" episodes---max dur. 8 sec, Max V 158---NSVT. Patient bi-ventricularly pacing >99% of the time.  She is pacemaker dependent due to complete heart block. She's had 4 surgical procedures in her left subclavian area and her left ventricular lead is epicardial. Her device has migrated caudally, into her breast, over at least 12-15 cm from her clavicle.  She has a long-standing history of nonischemic cardiomyopathy. She has complete heart block. Her initial CRT pacemaker was placed in 9150, complicated by late coronary sinus lead dislodgment in 2006. In 2007, it was upgraded to a biventricular ICD (Medtronic [place]) with an epicardial left ventricular lead in 2007 (Medtronic [phone]). 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 She has known occlusion of left subclavian vein. 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 She has a Medtronic Sprint Fidelis [phone] dual coil active fixation defibrillator lead that is under advisory. For this reason her old RV pacemaker lead (Medtronic [id number] implanted 2003) was used  for pace-sense features, the Sprint Fidelis lead was left in place for tachycardia therapies only. The right atrial lead is the original one placed in 2003 (Medtronic [phone])  She has responded very well to cardiac resynchronization therapy. She has well compensated heart failure (NYHA functional class I) despite a very low left ventricular ejection fraction, most recently estimated to be 25-30% by an echocardiogram in July of this 2014. She has moderate eccentric mitral insufficiency that is felt to be most likely secondary to cardiomyopathy. Coronary angiography in 2006 showed scattered mild to moderate coronary stenoses (40-50% LAD, 30% RCA, 2006). Her nuclear stress test in December of 2013 was interpreted as showing a large area of anterior scar and this has been seen on nuclear stress tests performed as far back as 2004. By scintigraphy left ventricular ejection fraction was only 17%.     No Known Allergies  Current Outpatient Prescriptions  Medication Sig Dispense Refill  . Artificial Tear Solution (SOOTHE XP) SOLN Apply 1 drop to eye daily as needed (dry eyes).    . Ascorbic Acid (VITAMIN C) 500 MG tablet Take 500 mg by mouth daily.      Marland Kitchen aspirin 81 MG tablet Take 81 mg by mouth daily.      . Carboxymethylcellul-Glycerin (REFRESH OPTIVE OP) Apply 1 drop to eye at bedtime.    . carvedilol (COREG) 12.5 MG tablet TAKE 1 TABLET TWICE A DAY 180 tablet 2  . diazepam (VALIUM) 5 MG tablet Take 5 mg by mouth at bedtime as needed for anxiety.    . digoxin (DIGITEK) 0.125 MG tablet Take 1 tablet (125 mcg total) by mouth daily. 90 tablet 2  .  furosemide (LASIX) 40 MG tablet Take 40-60 mg by mouth daily. Alternating between 40 mg & 60 mg every other day    . glucose blood (ACCU-CHEK AVIVA) test strip Test two times daily. Code 250.02 100 each 11  . ibandronate (BONIVA) 150 MG tablet Take 150 mg by mouth every 30 (thirty) days. Take in the morning with a full glass of water, on an empty stomach, and do  not take anything else by mouth or lie down for the next 30 min.    Marland Kitchen JANUVIA 100 MG tablet TAKE 1 TABLET DAILY 90 tablet 2  . Lancets MISC Test as directed two times daily. Code 250.02 100 each 11  . losartan (COZAAR) 50 MG tablet Take 1 tablet (50 mg total) by mouth daily. 90 tablet 3  . metFORMIN (GLUCOPHAGE) 500 MG tablet Take 1,000 mg by mouth daily with breakfast.     . potassium chloride SA (K-DUR,KLOR-CON) 20 MEQ tablet TAKE 1 TABLET DAILY 90 tablet 3  . simvastatin (ZOCOR) 20 MG tablet TAKE 1 TABLET AT BEDTIME 30 tablet 9  . spironolactone (ALDACTONE) 25 MG tablet TAKE ONE-HALF TABLET (12.5 MG) DAILY 45 tablet 2  . Wheat Dextrin (BENEFIBER) POWD Take by mouth. 2 tablespoons once daily     No current facility-administered medications for this visit.    Past Medical History  Diagnosis Date  . ICD (implantable cardiac defibrillator) in place 09/22/2011  . Nonischemic cardiomyopathy 09/22/2011  . CHF (congestive heart failure)   . Diabetes mellitus     type II  . Hyperlipidemia   . Hypertension   . Osteopenia   . Adenomatous colon polyp   . Polyp of larynx   . CAD (coronary artery disease)     Past Surgical History  Procedure Laterality Date  . Cardiac defibrillator placement  05/21/2002    s/p re-do pacemaker/defibrillator October 2011- [place].  . Abdominal hysterectomy    . Left breast tumor resection  1980  . Colonoscopy    . Implantable cardioverter defibrillator generator change  09/01/2010    Medtronic  . Cardiac catheterization  07/28/2005    noncritical CAD  . Biv icd genertaor change out N/A 07/29/2014    Procedure: BIV ICD GENERTAOR CHANGE OUT;  Surgeon: Sanda Klein, MD;  [facility] CATH LAB;  Service: Cardiovascular;  Laterality: N/A;    Family History  Problem Relation Age of Onset  . Arthritis Mother   . Arthritis Father   . Heart disease Brother     CABG  . Prostate cancer Brother     Prostate problem with up and down PSA  . Diabetes  Other     1st degree    History   Social History  . Marital Status: Married    Spouse Name: N/A    Number of Children: 2  . Years of Education: N/A   Occupational History  . retired Investment banker, corporate. report clerk    Social History Main Topics  . Smoking status: Never Smoker   . Smokeless tobacco: Never Used  . Alcohol Use: No  . Drug Use: No  . Sexual Activity: Not on file   Other Topics Concern  . Not on file   Social History Narrative   Husband now with Multiple Myeloma- see Dr. Juanita Craver    Review of systems: The patient specifically denies any chest pain at rest or with exertion, dyspnea at rest or with exertion, orthopnea, paroxysmal nocturnal dyspnea, syncope, palpitations, focal neurological deficits, intermittent claudication,  lower extremity edema, unexplained weight gain, cough, hemoptysis or wheezing.  The patient also denies abdominal pain, nausea, vomiting, dysphagia, diarrhea, constipation, polyuria, polydipsia, dysuria, hematuria, frequency, urgency, abnormal bleeding or bruising, fever, chills, unexpected weight changes, mood swings, change in skin or hair texture, change in voice quality, auditory or visual problems, allergic reactions or rashes, new musculoskeletal complaints other than usual "aches and pains".   PHYSICAL EXAM BP 120/60 mmHg  Pulse 84  Resp 16  Ht 5' 4.5" (1.638 m)  Wt 110 lb 1.6 oz (49.941 kg)  BMI 18.61 kg/m2 General: Alert, oriented x3, no distress  Head: no evidence of trauma, PERRL, EOMI, no exophtalmos or lid lag, no myxedema, no xanthelasma; normal ears, nose and oropharynx  Neck: normal jugular venous pulsations and no hepatojugular reflux; brisk carotid pulses without delay and no carotid bruits  Chest: clear to auscultation, no signs of consolidation by percussion or palpation, normal fremitus, symmetrical and full respiratory excursions; the left subclavian ICD scar is healthy, the device has slipped into her breast.  Collateral venous circulation is seen over the left anterior chest.  Cardiovascular: Inferolaterally displaced and heaving apical impulse, regular rhythm, normal first and paradoxically split second heart sounds, grade 3-2/2 whole systolic apical murmur , No rubs or gallops  Abdomen: no tenderness or distention, no masses by palpation, no abnormal pulsatility or arterial bruits, normal bowel sounds, no hepatosplenomegaly  Extremities: no clubbing, cyanosis or edema; 2+ radial, ulnar and brachial pulses bilaterally; 2+ right femoral, posterior tibial and dorsalis pedis pulses; 2+ left femoral, posterior tibial and dorsalis pedis pulses; no subclavian or femoral bruits  Neurological: grossly nonfocal   EKG: atrial sensed biventricular paced   Lipid Panel     Component Value Date/Time   CHOL 137 09/02/2014 1441   TRIG 140.0 09/02/2014 1441   HDL 46.70 09/02/2014 1441   CHOLHDL 3 09/02/2014 1441   VLDL 28.0 09/02/2014 1441   LDLCALC 62 09/02/2014 1441    BMET    Component Value Date/Time   NA 142 09/02/2014 1441   K 4.8 09/02/2014 1441   CL 102 09/02/2014 1441   CO2 30 09/02/2014 1441   GLUCOSE 104* 09/02/2014 1441   BUN 17 09/02/2014 1441   CREATININE 1.3* 09/02/2014 1441   CREATININE 1.44* 07/22/2014 1210   CALCIUM 10.7* 09/02/2014 1441   GFRNONAA 59.30 03/19/2010 1046   GFRAA 70 10/20/2008 0852     ASSESSMENT AND PLAN Chronic systolic congestive heart failure, NYHA class 1  She has excellent functional status despite severely depressed left ventricular systolic function. She has shown evidence of excellent response to CRT and deterioration when CRT was interrupted secondary to dislodgment years ago. No changes are made to her chronic medical therapy which includes fairly high dose of carvedilol, spironolactone, losartan, digoxin and a relatively low dose of loop diuretic.  She appears to be euvolemic by clinical evaluation and thoracic impedance measurements.    Biventricular ICD (implantable cardioverter-defibrillator) in place  The left subclavian vein is occluded. She is pacemaker dependent. She has a defibrillator lead with potential risk for coil fracture, but her defibrillator lead is used exclusively for shock therapy and not for pace/sense features. She has never required defibrillator therapy since device implantation.   CORONARY ARTERY DISEASE  She does not have any symptoms of angina pectoris. She has moderate coronary atherosclerotic lesions by remote angiography. Her nuclear stress test in 2013 did not show areas of reversible ischemia. Risk factor modification  DIABETES MELLITUS, TYPE II  Avoid  Actos.   Medtronic Consulta implanted October 2011  Right atrial lead Medtronic 5076 implanted 2003  Right ventricular lead ICD sprint Fidelis 6949 implanted 2011  Old RV pacemaker lead Medtronic 5076 implanted 2003 in use as a pace sense lead  Left ventricular lead epicardial Medtronic 5071 implanted 2007  Left subclavian device procedures performed in 2003, 2007, reexploration of pocket for hematoma 2007, 2011  Known occlusion of left subclavian vein by previous sonography.  GI symptoms raise concern of possible digoxin toxicity. Will check level. Also recheck TSH in view of inability to gain weight. She is due a lipid profile.  Orders Placed This Encounter  Procedures  . CBC  . Comp Met (CMET)  . Digoxin level  . TSH  . Lipid Profile  . HgB A1c  . Urinalysis  . Implantable device check   Meds ordered this encounter  Medications  . Carboxymethylcellul-Glycerin (REFRESH OPTIVE OP)    Sig: Apply 1 drop to eye at bedtime.    Holli Humbles, MD, [place] [phone] office [phone] pager

## 2014-10-30 ENCOUNTER — Telehealth: Payer: Self-pay | Admitting: *Deleted

## 2014-10-30 ENCOUNTER — Telehealth: Payer: Self-pay | Admitting: Internal Medicine

## 2014-10-30 DIAGNOSIS — R8290 Unspecified abnormal findings in urine: Secondary | ICD-10-CM

## 2014-10-30 LAB — URINALYSIS
BILIRUBIN URINE: NEGATIVE
Glucose, UA: NEGATIVE mg/dL
KETONES UR: NEGATIVE mg/dL
NITRITE: NEGATIVE
PH: 5.5 (ref 5.0–8.0)
Protein, ur: 30 mg/dL — AB
SPECIFIC GRAVITY, URINE: 1.009 (ref 1.005–1.030)
Urobilinogen, UA: 0.2 mg/dL (ref 0.0–1.0)

## 2014-10-30 LAB — DIGOXIN LEVEL: DIGOXIN LVL: 1.2 ng/mL (ref 0.8–2.0)

## 2014-10-30 MED ORDER — DIGOXIN 125 MCG PO TABS: 125.0000 ug | ORAL_TABLET | ORAL | Status: DC

## 2014-10-30 MED ORDER — METFORMIN HCL ER 500 MG PO TB24: 500.0000 mg | ORAL_TABLET | Freq: Every day | ORAL | Status: DC

## 2014-10-30 NOTE — Telephone Encounter (Signed)
Called the patient and recent blood sugars, 186, 229, 200, 146, 115, 120, 122 after taking 2 metformin. Now she is taking 1 metformin on Monday was 237, 361, 213, 201, 187. She had blood work done at her cardiologist this week and her a1c was 7.4.  Patient was informed to have cardiologist fax over office notes and lab results to PCP.

## 2014-10-30 NOTE — Telephone Encounter (Signed)
These are mildly elevated blood sugars and a1c  Ok to increase the metformin to 2 per day , change to ER version, Done erx to [facility]  Cont all other meds  Cont to check cbgs as she does  Robin to contact pt

## 2014-10-30 NOTE — Telephone Encounter (Signed)
-----   Message from Priscilla Klein, MD sent at 10/30/2014  8:55 AM EST ----- Cholesterol numbers are great. Hemoglobin A1c is indeed a little high at 7.4%, reflecting her recent difficulty with blood sugar control. Digoxin level is borderline high and I wonder whether it could be causing some of her GI complaints. Please reduce digoxin to every other day only. White blood cell count is high, frequently a sign of infection. Very mild anemia. Urinalysis is abnormal, strongly suggestive of urinary infection. Recommend a urine culture before she starts antibiotics. Nitrofurantoin 100 mg Q12 for 3 days

## 2014-10-30 NOTE — Telephone Encounter (Signed)
Blood sugar is going up and wants to know how to adjust her metformin as she saw her heart MD and told her her sugar is going up and to call PCP.  [phone]

## 2014-10-30 NOTE — Telephone Encounter (Signed)
Patient notified of lab results.  Instructed to reduce Digoxin to every other day.  Will go to the lab today for the urine culture.  If indicated Dr. Loletha Grayer wants to start Nitrofurantoin 100mg  q12 x 3 day.  Patient voiced understanding.

## 2014-10-31 ENCOUNTER — Other Ambulatory Visit: Payer: Self-pay | Admitting: Internal Medicine

## 2014-10-31 LAB — URINALYSIS W MICROSCOPIC + REFLEX CULTURE
Bilirubin Urine: NEGATIVE
CASTS: NONE SEEN
CRYSTALS: NONE SEEN
GLUCOSE, UA: NEGATIVE mg/dL
Ketones, ur: NEGATIVE mg/dL
NITRITE: NEGATIVE
PH: 5.5 (ref 5.0–8.0)
Protein, ur: NEGATIVE mg/dL
SPECIFIC GRAVITY, URINE: 1.007 (ref 1.005–1.030)
Squamous Epithelial / LPF: NONE SEEN
Urobilinogen, UA: 0.2 mg/dL (ref 0.0–1.0)

## 2014-10-31 MED ORDER — METFORMIN HCL ER 500 MG PO TB24: ORAL_TABLET | ORAL | Status: DC

## 2014-10-31 NOTE — Telephone Encounter (Signed)
Patient informed of PCP instructions. 

## 2014-11-02 LAB — URINE CULTURE: Colony Count: >=100000

## 2014-11-04 ENCOUNTER — Other Ambulatory Visit: Payer: Self-pay | Admitting: *Deleted

## 2014-11-04 ENCOUNTER — Encounter: Payer: Self-pay | Admitting: Cardiovascular Disease

## 2014-11-04 MED ORDER — CIPROFLOXACIN HCL 250 MG PO TABS: ORAL_TABLET | ORAL | Status: DC

## 2014-11-04 NOTE — Telephone Encounter (Signed)
This encounter was created in error - please disregard.

## 2014-11-04 NOTE — Telephone Encounter (Signed)
Patient is returning a call from [place] about lab results.

## 2014-11-08 ENCOUNTER — Other Ambulatory Visit: Payer: Self-pay | Admitting: Cardiovascular Disease

## 2014-11-10 NOTE — Telephone Encounter (Signed)
Rx(s) sent to pharmacy electronically.  

## 2014-11-11 ENCOUNTER — Emergency Department (HOSPITAL_COMMUNITY): Payer: Medicare Other

## 2014-11-11 ENCOUNTER — Encounter (HOSPITAL_COMMUNITY): Payer: Self-pay | Admitting: Emergency Medicine

## 2014-11-11 ENCOUNTER — Observation Stay (HOSPITAL_COMMUNITY)
Admission: EM | Admit: 2014-11-11 | Discharge: 2014-11-12 | Disposition: A | Discharge status: Home / Self Care | Payer: Medicare Other | Attending: Cardiovascular Disease | Admitting: Cardiovascular Disease

## 2014-11-11 DIAGNOSIS — E119 Type 2 diabetes mellitus without complications: Secondary | ICD-10-CM | POA: Insufficient documentation

## 2014-11-11 DIAGNOSIS — Z79899 Other long term (current) drug therapy: Secondary | ICD-10-CM | POA: Insufficient documentation

## 2014-11-11 DIAGNOSIS — Z95810 Presence of automatic (implantable) cardiac defibrillator: Secondary | ICD-10-CM | POA: Insufficient documentation

## 2014-11-11 DIAGNOSIS — I2510 Atherosclerotic heart disease of native coronary artery without angina pectoris: Secondary | ICD-10-CM | POA: Diagnosis not present

## 2014-11-11 DIAGNOSIS — Z86010 Personal history of colonic polyps: Secondary | ICD-10-CM | POA: Diagnosis not present

## 2014-11-11 DIAGNOSIS — Z7982 Long term (current) use of aspirin: Secondary | ICD-10-CM | POA: Insufficient documentation

## 2014-11-11 DIAGNOSIS — I10 Essential (primary) hypertension: Secondary | ICD-10-CM | POA: Insufficient documentation

## 2014-11-11 DIAGNOSIS — Z9889 Other specified postprocedural states: Secondary | ICD-10-CM | POA: Insufficient documentation

## 2014-11-11 DIAGNOSIS — E785 Hyperlipidemia, unspecified: Secondary | ICD-10-CM | POA: Insufficient documentation

## 2014-11-11 DIAGNOSIS — R079 Chest pain, unspecified: Secondary | ICD-10-CM | POA: Diagnosis not present

## 2014-11-11 DIAGNOSIS — I509 Heart failure, unspecified: Secondary | ICD-10-CM | POA: Insufficient documentation

## 2014-11-11 DIAGNOSIS — I208 Other forms of angina pectoris: Secondary | ICD-10-CM | POA: Diagnosis present

## 2014-11-11 DIAGNOSIS — R072 Precordial pain: Secondary | ICD-10-CM | POA: Diagnosis not present

## 2014-11-11 DIAGNOSIS — R0602 Shortness of breath: Secondary | ICD-10-CM | POA: Diagnosis not present

## 2014-11-11 DIAGNOSIS — R0789 Other chest pain: Secondary | ICD-10-CM | POA: Diagnosis not present

## 2014-11-11 DIAGNOSIS — I2089 Other forms of angina pectoris: Secondary | ICD-10-CM | POA: Diagnosis present

## 2014-11-11 DIAGNOSIS — E1129 Type 2 diabetes mellitus with other diabetic kidney complication: Secondary | ICD-10-CM | POA: Diagnosis present

## 2014-11-11 HISTORY — DX: Type 2 diabetes mellitus without complications: E11.9

## 2014-11-11 HISTORY — DX: Presence of automatic (implantable) cardiac defibrillator: Z95.810

## 2014-11-11 LAB — URINALYSIS, ROUTINE W REFLEX MICROSCOPIC
BILIRUBIN URINE: NEGATIVE
GLUCOSE, UA: NEGATIVE mg/dL
HGB URINE DIPSTICK: NEGATIVE
KETONES UR: NEGATIVE mg/dL
Nitrite: NEGATIVE
PROTEIN: NEGATIVE mg/dL
Specific Gravity, Urine: 1.013 (ref 1.005–1.030)
UROBILINOGEN UA: 0.2 mg/dL (ref 0.0–1.0)
pH: 5.5 (ref 5.0–8.0)

## 2014-11-11 LAB — CBC WITH DIFFERENTIAL/PLATELET
BASOS ABS: 0.1 10*3/uL (ref 0.0–0.1)
Basophils Relative: 1 % (ref 0–1)
EOS ABS: 0.2 10*3/uL (ref 0.0–0.7)
Eosinophils Relative: 3 % (ref 0–5)
HCT: 31.9 % — ABNORMAL LOW (ref 36.0–46.0)
Hemoglobin: 10 g/dL — ABNORMAL LOW (ref 12.0–15.0)
LYMPHS ABS: 2.3 10*3/uL (ref 0.7–4.0)
Lymphocytes Relative: 30 % (ref 12–46)
MCH: 29.5 pg (ref 26.0–34.0)
MCHC: 31.3 g/dL (ref 30.0–36.0)
MCV: 94.1 fL (ref 78.0–100.0)
Monocytes Absolute: 0.6 10*3/uL (ref 0.1–1.0)
Monocytes Relative: 8 % (ref 3–12)
NEUTROS PCT: 58 % (ref 43–77)
Neutro Abs: 4.5 10*3/uL (ref 1.7–7.7)
PLATELETS: 186 10*3/uL (ref 150–400)
RBC: 3.39 MIL/uL — ABNORMAL LOW (ref 3.87–5.11)
RDW: 14.1 % (ref 11.5–15.5)
WBC: 7.7 10*3/uL (ref 4.0–10.5)

## 2014-11-11 LAB — COMPREHENSIVE METABOLIC PANEL
ALK PHOS: 61 U/L (ref 39–117)
ALT: 11 U/L (ref 0–35)
AST: 19 U/L (ref 0–37)
Albumin: 3.7 g/dL (ref 3.5–5.2)
Anion gap: 11 (ref 5–15)
BUN: 27 mg/dL — ABNORMAL HIGH (ref 6–23)
CALCIUM: 9.5 mg/dL (ref 8.4–10.5)
CO2: 24 mmol/L (ref 19–32)
Chloride: 103 mEq/L (ref 96–112)
Creatinine, Ser: 1.45 mg/dL — ABNORMAL HIGH (ref 0.50–1.10)
GFR calc non Af Amer: 34 mL/min — ABNORMAL LOW (ref >90)
GFR, EST AFRICAN AMERICAN: 39 mL/min — AB (ref >90)
GLUCOSE: 112 mg/dL — AB (ref 70–99)
POTASSIUM: 4.2 mmol/L (ref 3.5–5.1)
SODIUM: 138 mmol/L (ref 135–145)
Total Bilirubin: 0.3 mg/dL (ref 0.3–1.2)
Total Protein: 6.9 g/dL (ref 6.0–8.3)

## 2014-11-11 LAB — CREATININE, SERUM
Creatinine, Ser: 1.66 mg/dL — ABNORMAL HIGH (ref 0.50–1.10)
GFR calc Af Amer: 33 mL/min — ABNORMAL LOW (ref >90)
GFR calc non Af Amer: 28 mL/min — ABNORMAL LOW (ref >90)

## 2014-11-11 LAB — BRAIN NATRIURETIC PEPTIDE: B Natriuretic Peptide: 186.2 pg/mL — ABNORMAL HIGH (ref 0.0–100.0)

## 2014-11-11 LAB — I-STAT TROPONIN, ED: Troponin i, poc: 0.01 ng/mL (ref 0.00–0.08)

## 2014-11-11 LAB — CBC
HEMATOCRIT: 30.6 % — AB (ref 36.0–46.0)
HEMOGLOBIN: 9.5 g/dL — AB (ref 12.0–15.0)
MCH: 29.2 pg (ref 26.0–34.0)
MCHC: 31 g/dL (ref 30.0–36.0)
MCV: 94.2 fL (ref 78.0–100.0)
Platelets: 209 10*3/uL (ref 150–400)
RBC: 3.25 MIL/uL — ABNORMAL LOW (ref 3.87–5.11)
RDW: 13.9 % (ref 11.5–15.5)
WBC: 6.3 10*3/uL (ref 4.0–10.5)

## 2014-11-11 LAB — TROPONIN I: Troponin I: <0.03 ng/mL (ref <0.031)

## 2014-11-11 LAB — URINE MICROSCOPIC-ADD ON

## 2014-11-11 LAB — GLUCOSE, CAPILLARY
GLUCOSE-CAPILLARY: 103 mg/dL — AB (ref 70–99)
Glucose-Capillary: 102 mg/dL — ABNORMAL HIGH (ref 70–99)

## 2014-11-11 LAB — DIGOXIN LEVEL: DIGOXIN LVL: 0.5 ng/mL — AB (ref 0.8–2.0)

## 2014-11-11 MED ORDER — POTASSIUM CHLORIDE CRYS ER 20 MEQ PO TBCR
20.0000 meq | EXTENDED_RELEASE_TABLET | Freq: Every day | ORAL | Status: DC
  Filled 2014-11-11: qty 1

## 2014-11-11 MED ORDER — PANTOPRAZOLE SODIUM 40 MG PO TBEC
40.0000 mg | DELAYED_RELEASE_TABLET | Freq: Every day | ORAL | Status: DC
  Administered 2014-11-11 – 2014-11-12 (×2): 40 mg via ORAL
  Filled 2014-11-11 (×2): qty 1

## 2014-11-11 MED ORDER — HEPARIN SODIUM (PORCINE) 5000 UNIT/ML IJ SOLN
5000.0000 [IU] | Freq: Three times a day (TID) | INTRAMUSCULAR | Status: DC
  Administered 2014-11-11 – 2014-11-12 (×3): 5000 [IU] via SUBCUTANEOUS
  Filled 2014-11-11 (×4): qty 1

## 2014-11-11 MED ORDER — ENSURE COMPLETE PO LIQD: 237.0000 mL | Freq: Two times a day (BID) | ORAL | Status: DC

## 2014-11-11 MED ORDER — DIGOXIN 125 MCG PO TABS
125.0000 ug | ORAL_TABLET | ORAL | Status: DC
  Filled 2014-11-11 (×2): qty 1

## 2014-11-11 MED ORDER — DIAZEPAM 5 MG PO TABS
5.0000 mg | ORAL_TABLET | Freq: Every evening | ORAL | Status: DC | PRN
  Administered 2014-11-11: 5 mg via ORAL
  Filled 2014-11-11: qty 1

## 2014-11-11 MED ORDER — ASPIRIN 325 MG PO TABS
325.0000 mg | ORAL_TABLET | Freq: Once | ORAL | Status: AC
  Administered 2014-11-11: 325 mg via ORAL
  Filled 2014-11-11: qty 1

## 2014-11-11 MED ORDER — SPIRONOLACTONE 25 MG PO TABS
25.0000 mg | ORAL_TABLET | Freq: Every day | ORAL | Status: DC
  Administered 2014-11-11 – 2014-11-12 (×2): 25 mg via ORAL
  Filled 2014-11-11: qty 1

## 2014-11-11 MED ORDER — ASPIRIN 81 MG PO TABS
81.0000 mg | ORAL_TABLET | Freq: Every day | ORAL | Status: DC
  Filled 2014-11-11 (×4): qty 1

## 2014-11-11 MED ORDER — POTASSIUM CHLORIDE CRYS ER 20 MEQ PO TBCR
20.0000 meq | EXTENDED_RELEASE_TABLET | Freq: Every day | ORAL | Status: DC
  Administered 2014-11-11 – 2014-11-12 (×2): 20 meq via ORAL
  Filled 2014-11-11: qty 1

## 2014-11-11 MED ORDER — SIMVASTATIN 20 MG PO TABS
20.0000 mg | ORAL_TABLET | Freq: Every day | ORAL | Status: DC
  Administered 2014-11-11: 20 mg via ORAL
  Filled 2014-11-11: qty 1

## 2014-11-11 MED ORDER — NITROGLYCERIN 0.4 MG SL SUBL: 0.4000 mg | SUBLINGUAL_TABLET | SUBLINGUAL | Status: DC | PRN

## 2014-11-11 MED ORDER — SPIRONOLACTONE 25 MG PO TABS
25.0000 mg | ORAL_TABLET | Freq: Every day | ORAL | Status: DC
  Filled 2014-11-11: qty 1

## 2014-11-11 MED ORDER — INSULIN ASPART 100 UNIT/ML ~~LOC~~ SOLN: 0.0000 [IU] | Freq: Three times a day (TID) | SUBCUTANEOUS | Status: DC

## 2014-11-11 MED ORDER — DIGOXIN 125 MCG PO TABS
125.0000 ug | ORAL_TABLET | ORAL | Status: DC
  Administered 2014-11-12: 125 ug via ORAL
  Filled 2014-11-11: qty 1

## 2014-11-11 MED ORDER — ACETAMINOPHEN 325 MG PO TABS: 650.0000 mg | ORAL_TABLET | ORAL | Status: DC | PRN

## 2014-11-11 MED ORDER — DIGOXIN 125 MCG PO TABS: 125.0000 ug | ORAL_TABLET | ORAL | Status: DC

## 2014-11-11 MED ORDER — LINAGLIPTIN 5 MG PO TABS
5.0000 mg | ORAL_TABLET | Freq: Every day | ORAL | Status: DC
  Administered 2014-11-11 – 2014-11-12 (×2): 5 mg via ORAL
  Filled 2014-11-11 (×2): qty 1

## 2014-11-11 MED ORDER — CARVEDILOL 12.5 MG PO TABS
12.5000 mg | ORAL_TABLET | Freq: Two times a day (BID) | ORAL | Status: DC
  Administered 2014-11-11 – 2014-11-12 (×3): 12.5 mg via ORAL
  Filled 2014-11-11 (×3): qty 1

## 2014-11-11 MED ORDER — ONDANSETRON HCL 4 MG/2ML IJ SOLN: 4.0000 mg | Freq: Four times a day (QID) | INTRAMUSCULAR | Status: DC | PRN

## 2014-11-11 MED ORDER — SODIUM CHLORIDE 0.9 % IV SOLN
INTRAVENOUS | Status: AC
  Administered 2014-11-11: 21:00:00 via INTRAVENOUS

## 2014-11-11 NOTE — ED Notes (Signed)
Ordered cardiac healthy tray for lunch.  

## 2014-11-11 NOTE — ED Notes (Signed)
Pt c/o left sided CP with radiation down left arm starting last night; pt denies swelling or SOB; pt sts feels congested and like she needs to cough

## 2014-11-11 NOTE — ED Notes (Signed)
Pt aware of need of urine specimen, Ria Comment, RN had already asked pt if she could go and she could not then or now; will attempt again later

## 2014-11-11 NOTE — ED Notes (Signed)
Cardiology at the bedside.

## 2014-11-11 NOTE — ED Provider Notes (Signed)
CSN: [id number]     Arrival date & time 11/11/14  9629 History   First MD Initiated Contact with Patient 11/11/14 [phone]     Chief Complaint  Patient presents with  . Chest Pain     (Consider location/radiation/quality/duration/timing/severity/associated sxs/prior Treatment) The history is provided by the patient.  Priscilla Anderson is a 80 y.o. female history of diabetes, CHF, pacemaker, CAD with no stents here presenting with chest pain and congestion and mild shortness of breath. Substernal chest pain with radiation to the left arm starting yesterday. Also feels congested but denies any cough. Some mild shortness of breath associated with it. Denies worsening leg swelling. Was recently treated for UTI with Cipro. Denies any fevers or chills. Took 2 Tylenol with minimal relief.     Past Medical History  Diagnosis Date  . ICD (implantable cardiac defibrillator) in place 09/22/2011  . Nonischemic cardiomyopathy 09/22/2011  . CHF (congestive heart failure)   . Diabetes mellitus     type II  . Hyperlipidemia   . Hypertension   . Osteopenia   . Adenomatous colon polyp   . Polyp of larynx   . CAD (coronary artery disease)    Past Surgical History  Procedure Laterality Date  . Cardiac defibrillator placement  05/21/2002    s/p re-do pacemaker/defibrillator October 2011- [place].  . Abdominal hysterectomy    . Left breast tumor resection  1980  . Colonoscopy    . Implantable cardioverter defibrillator generator change  09/01/2010    Medtronic  . Cardiac catheterization  07/28/2005    noncritical CAD  . Biv icd genertaor change out N/A 07/29/2014    Procedure: BIV ICD GENERTAOR CHANGE OUT;  Surgeon: Sanda Klein, MD;  Location: [facility] CATH LAB;  Service: Cardiovascular;  Laterality: N/A;   Family History  Problem Relation Age of Onset  . Arthritis Mother   . Arthritis Father   . Heart disease Brother     CABG  . Prostate cancer Brother     Prostate problem with up and down  PSA  . Diabetes Other     1st degree   History  Substance Use Topics  . Smoking status: Never Smoker   . Smokeless tobacco: Never Used  . Alcohol Use: No   OB History    No data available     Review of Systems  Cardiovascular: Positive for chest pain.  All other systems reviewed and are negative.     Allergies  Review of patient's allergies indicates no known allergies.  Home Medications   Prior to Admission medications   Medication Sig Start Date End Date Taking? Authorizing Provider  Artificial Tear Solution (SOOTHE XP) SOLN Apply 1 drop to eye daily as needed (dry eyes).   Yes Historical Provider, MD  Ascorbic Acid (VITAMIN C) 500 MG tablet Take 500 mg by mouth daily.     Yes Historical Provider, MD  aspirin 81 MG tablet Take 81 mg by mouth daily.     Yes Historical Provider, MD  Carboxymethylcellul-Glycerin (REFRESH OPTIVE OP) Apply 1 drop to eye at bedtime.   Yes Historical Provider, MD  carvedilol (COREG) 12.5 MG tablet TAKE 1 TABLET TWICE A DAY 09/15/14  Yes Mihai Croitoru, MD  diazepam (VALIUM) 5 MG tablet Take 5 mg by mouth at bedtime as needed for anxiety.   Yes Historical Provider, MD  digoxin (DIGITEK) 0.125 MG tablet Take 1 tablet (125 mcg total) by mouth every other day. 10/30/14  Yes Sanda Klein, MD  furosemide (LASIX) 40 MG tablet Take 40-60 mg by mouth daily. Alternating between 40 mg & 60 mg every other day   Yes Historical Provider, MD  glucose blood (ACCU-CHEK AVIVA) test strip Test two times daily. Code 250.02 12/20/11  Yes Biagio Borg, MD  ibandronate (BONIVA) 150 MG tablet Take 150 mg by mouth every 30 (thirty) days. Take in the morning with a full glass of water, on an empty stomach, and do not take anything else by mouth or lie down for the next 30 min.   Yes Historical Provider, MD  JANUVIA 100 MG tablet TAKE 1 TABLET DAILY 08/06/14  Yes Biagio Borg, MD  Lancets MISC Test as directed two times daily. Code 250.02 12/20/11  Yes Biagio Borg, MD  losartan  (COZAAR) 50 MG tablet Take 1 tablet (50 mg total) by mouth daily. 11/10/14  Yes Mihai Croitoru, MD  metFORMIN (GLUCOPHAGE-XR) 500 MG 24 hr tablet 2 tabs by mouth in the AM Patient taking differently: Take 500 mg by mouth daily with breakfast.  10/31/14  Yes Biagio Borg, MD  potassium chloride SA (K-DUR,KLOR-CON) 20 MEQ tablet TAKE 1 TABLET DAILY 08/11/14  Yes Mihai Croitoru, MD  simvastatin (ZOCOR) 20 MG tablet TAKE 1 TABLET AT BEDTIME 10/21/14  Yes Mihai Croitoru, MD  spironolactone (ALDACTONE) 25 MG tablet TAKE ONE-HALF TABLET (12.5 MG) DAILY 09/15/14  Yes Mihai Croitoru, MD  Wheat Dextrin (BENEFIBER) POWD Take by mouth. 2 tablespoons once daily   Yes Historical Provider, MD  ciprofloxacin (CIPRO) 250 MG tablet One tablet every twelve hours x 3 days Patient not taking: Reported on 11/11/2014 11/04/14   Leonie Man, MD   BP 119/65 mmHg  Pulse 69  Temp(Src) 98.2 F (36.8 C) (Oral)  Resp 18  Ht 5' 4.5" (1.638 m)  Wt 110 lb (49.896 kg)  BMI 18.60 kg/m2  SpO2 100% Physical Exam  Constitutional: She is oriented to person, place, and time.  Chronically ill slightly uncomfortable   HENT:  Head: Normocephalic.  Mouth/Throat: Oropharynx is clear and moist.  Eyes: Conjunctivae and EOM are normal. Pupils are equal, round, and reactive to light.  Neck: Normal range of motion. Neck supple.  Cardiovascular: Normal rate, regular rhythm and normal heart sounds.   Pulmonary/Chest: Effort normal. She exhibits no tenderness.  Diminished bilateral bases   Abdominal: Soft. Bowel sounds are normal. She exhibits no distension. There is no tenderness. There is no rebound and no guarding.  Musculoskeletal: Normal range of motion. She exhibits no edema or tenderness.  Neurological: She is alert and oriented to person, place, and time. No cranial nerve deficit. Coordination normal.  Skin: Skin is warm and dry.  Psychiatric: She has a normal mood and affect. Her behavior is normal. Judgment and thought  content normal.  Nursing note and vitals reviewed.   ED Course  Procedures (including critical care time) Labs Review Labs Reviewed  CBC WITH DIFFERENTIAL - Abnormal; Notable for the following:    RBC 3.39 (*)    Hemoglobin 10.0 (*)    HCT 31.9 (*)    All other components within normal limits  COMPREHENSIVE METABOLIC PANEL - Abnormal; Notable for the following:    Glucose, Bld 112 (*)    BUN 27 (*)    Creatinine, Ser 1.45 (*)    GFR calc non Af Amer 34 (*)    GFR calc Af Amer 39 (*)    All other components within normal limits  DIGOXIN LEVEL - Abnormal; Notable for  the following:    Digoxin Level 0.5 (*)    All other components within normal limits  BRAIN NATRIURETIC PEPTIDE - Abnormal; Notable for the following:    B Natriuretic Peptide 186.2 (*)    All other components within normal limits  URINE CULTURE  URINALYSIS, ROUTINE W REFLEX MICROSCOPIC  I-STAT TROPOININ, ED    Imaging Review Dg Chest 2 View  11/11/2014   CLINICAL DATA:  Acute left-sided chest pain.  EXAM: CHEST  2 VIEW  COMPARISON:  None.  FINDINGS: The heart size and mediastinal contours are within normal limits. No pneumothorax or pleural effusion is noted. Left-sided pacemaker is noted. Both lungs are clear. The visualized skeletal structures are unremarkable.  IMPRESSION: No acute cardiopulmonary abnormality seen.   Electronically Signed   By: Sabino Dick M.D.   On: 11/11/2014 08:22     EKG Interpretation   Date/Time:  Tuesday November 11 2014 07:43:09 EST Ventricular Rate:  91 PR Interval:  152 QRS Duration: 172 QT Interval:  452 QTC Calculation: 555 R Axis:   -126 Text Interpretation:  Atrial-sensed ventricular-paced rhythm Abnormal ECG  No significant change since last tracing Confirmed by YAO  MD, DAVID  ([id number]) on 11/11/2014 7:48:06 AM      MDM   Final diagnoses:  Chest pain    DAYANNARA PASCAL is a 80 y.o. female here with CP, mild SOB. Will need to r/o ACS. Also consider mild CHF  exacerbation. Will get labs, BNP, trop. I doubt PE.   9:30 AM Still has chest pressure after ASA. Given nitro. Trop neg x 1. Called cardiology to see patient.   11:30 AM Cardiology will admit.   Wandra Arthurs, MD 11/11/14 1130

## 2014-11-11 NOTE — H&P (Signed)
History and Physical  Patient ID: CHIRSTINE DEFRAIN MRN: [id number], SOB: Nov 30, 1933 80 y.o. Date of Encounter: 11/11/2014, 10:54 AM  Primary Physician: Cathlean Cower, MD Primary Cardiologist: Dr Sallyanne Kuster  Chief Complaint: Chest pain  HPI: 80 y.o. female w/ PMHx significant for chronic systolic heart failure who presented to [facility] on 11/11/2014 with complaints of chest pain.  The patient complains of a pressure-like sensation in her chest beginning yesterday evening. This was continuous overnight into this morning. The pain radiated into her left arm and she describes some tingling in her feet as well. There were no clear exacerbating factors. She has had GI upset now for 2-3 weeks. She's had some abdominal discomfort but no chest pain until yesterday. She denies shortness of breath, heart palpitations, orthopnea, PND, abdominal swelling, or leg swelling. She notes that her diabetes has not been as well controlled and metformin was increased about 3 weeks ago. She took a course of Cipro for urinary tract infection last week. She otherwise denies any recent medication changes or medical problems. She states that over the past 3-4 weeks, something 'just doesn't feel right.'  The patient has a long-standing history of nonischemic cardiomyopathy and complete heart block. She has undergone CRT-D. LVEF has been in the range of 25-30% without significant improvement with cardiac resynchronization therapy. However, she has had a good clinical response without any recent problems related to clinical heart failure. The patient has been noted to have mild to moderate coronary artery disease by coronary angiography in 2006.  The patient is relatively active. She lives alone, drives a car, and is independent in physical activities.  Past Medical History  Diagnosis Date  . ICD (implantable cardiac defibrillator) in place 09/22/2011  . Nonischemic cardiomyopathy 09/22/2011  . CHF (congestive  heart failure)   . Diabetes mellitus     type II  . Hyperlipidemia   . Hypertension   . Osteopenia   . Adenomatous colon polyp   . Polyp of larynx   . CAD (coronary artery disease)      Surgical History:  Past Surgical History  Procedure Laterality Date  . Cardiac defibrillator placement  05/21/2002    s/p re-do pacemaker/defibrillator October 2011- [place].  . Abdominal hysterectomy    . Left breast tumor resection  1980  . Colonoscopy    . Implantable cardioverter defibrillator generator change  09/01/2010    [employer]  . Cardiac catheterization  07/28/2005    noncritical CAD  . Biv icd genertaor change out N/A 07/29/2014    Procedure: BIV ICD GENERTAOR CHANGE OUT;  Surgeon: Sanda Klein, MD;  Location: [facility] CATH LAB;  Service: Cardiovascular;  Laterality: N/A;     Home Meds: Prior to Admission medications   Medication Sig Start Date End Date Taking? Authorizing Provider  Artificial Tear Solution (SOOTHE XP) SOLN Apply 1 drop to eye daily as needed (dry eyes).   Yes Historical Provider, MD  Ascorbic Acid (VITAMIN C) 500 MG tablet Take 500 mg by mouth daily.     Yes Historical Provider, MD  aspirin 81 MG tablet Take 81 mg by mouth daily.     Yes Historical Provider, MD  Carboxymethylcellul-Glycerin (REFRESH OPTIVE OP) Apply 1 drop to eye at bedtime.   Yes Historical Provider, MD  carvedilol (COREG) 12.5 MG tablet TAKE 1 TABLET TWICE A DAY 09/15/14  Yes Mihai Croitoru, MD  diazepam (VALIUM) 5 MG tablet Take 5 mg by mouth at bedtime as needed for anxiety.  Yes Historical Provider, MD  digoxin (DIGITEK) 0.125 MG tablet Take 1 tablet (125 mcg total) by mouth every other day. 10/30/14  Yes Mihai Croitoru, MD  furosemide (LASIX) 40 MG tablet Take 40-60 mg by mouth daily. Alternating between 40 mg & 60 mg every other day   Yes Historical Provider, MD  glucose blood (ACCU-CHEK AVIVA) test strip Test two times daily. Code 250.02 12/20/11  Yes Biagio Borg, MD  ibandronate (BONIVA)  150 MG tablet Take 150 mg by mouth every 30 (thirty) days. Take in the morning with a full glass of water, on an empty stomach, and do not take anything else by mouth or lie down for the next 30 min.   Yes Historical Provider, MD  JANUVIA 100 MG tablet TAKE 1 TABLET DAILY 08/06/14  Yes Biagio Borg, MD  Lancets MISC Test as directed two times daily. Code 250.02 12/20/11  Yes Biagio Borg, MD  losartan (COZAAR) 50 MG tablet Take 1 tablet (50 mg total) by mouth daily. 11/10/14  Yes Mihai Croitoru, MD  metFORMIN (GLUCOPHAGE-XR) 500 MG 24 hr tablet 2 tabs by mouth in the AM Patient taking differently: Take 500 mg by mouth daily with breakfast.  10/31/14  Yes Biagio Borg, MD  potassium chloride SA (K-DUR,KLOR-CON) 20 MEQ tablet TAKE 1 TABLET DAILY 08/11/14  Yes Mihai Croitoru, MD  simvastatin (ZOCOR) 20 MG tablet TAKE 1 TABLET AT BEDTIME 10/21/14  Yes Mihai Croitoru, MD  spironolactone (ALDACTONE) 25 MG tablet TAKE ONE-HALF TABLET (12.5 MG) DAILY 09/15/14  Yes Mihai Croitoru, MD  Wheat Dextrin (BENEFIBER) POWD Take by mouth. 2 tablespoons once daily   Yes Historical Provider, MD  ciprofloxacin (CIPRO) 250 MG tablet One tablet every twelve hours x 3 days Patient not taking: Reported on 11/11/2014 11/04/14   Leonie Man, MD    Allergies: No Known Allergies  History   Social History  . Marital Status: Married    Spouse Name: N/A    Number of Children: 2  . Years of Education: N/A   Occupational History  . retired Investment banker, corporate. report clerk    Social History Main Topics  . Smoking status: Never Smoker   . Smokeless tobacco: Never Used  . Alcohol Use: No  . Drug Use: No  . Sexual Activity: Not on file   Other Topics Concern  . Not on file   Social History Narrative   Husband now with Multiple Myeloma- see Dr. Juanita Craver     Family History  Problem Relation Age of Onset  . Arthritis Mother   . Arthritis Father   . Heart disease Brother     CABG  . Prostate cancer Brother      Prostate problem with up and down PSA  . Diabetes Other     1st degree    Review of Systems: General: negative for chills, fever, night sweats or weight changes.  ENT: negative for rhinorrhea or epistaxis Cardiovascular: see HPI Dermatological: negative for rash Respiratory: negative for cough or wheezing GI: negative for nausea, abdominal bloating. No hematemesis, hematochezia, diarrhea. GU: no hematuria, urgency, or frequency. Recent UTI noted but no symptoms. Neurologic: negative for visual changes, syncope, headache, or dizziness Heme: no easy bruising or bleeding Endo: negative for excessive thirst, thyroid disorder, or flushing Musculoskeletal: negative for joint pain or swelling, negative for myalgias All other systems reviewed and are otherwise negative except as noted above.  Physical Exam: Blood pressure 119/68, pulse 70, temperature 98.2 F (36.8  C), temperature source Oral, resp. rate 23, height 5' 4.5" (1.638 m), weight 110 lb (49.896 kg), SpO2 99 %. General: Well developed, well nourished, alert and oriented, pleasant elderly, thin woman in no acute distress. HEENT: Normocephalic, atraumatic, sclera non-icteric, no xanthomas, nares are without discharge.  Neck: Supple. Carotids 2+ without bruits. JVP normal Lungs: Clear bilaterally to auscultation without wheezes, rales, or rhonchi. Breathing is unlabored. Heart: RRR with normal S1 and S2. No murmurs, rubs, or gallops appreciated. Abdomen: Soft, mild diffuse tenderness, non-distended with normoactive bowel sounds. No hepatomegaly. No rebound/guarding. No obvious abdominal masses. Back: No CVA tenderness Msk:  Strength and tone appear normal for age. Extremities: No clubbing, cyanosis, or edema.  Distal pedal pulses are 2+ and equal bilaterally. Neuro: CNII-XII intact, moves all extremities spontaneously. Psych:  Responds to questions appropriately with a normal affect.   Labs:   Lab Results  Component Value Date    WBC 7.7 11/11/2014   HGB 10.0* 11/11/2014   HCT 31.9* 11/11/2014   MCV 94.1 11/11/2014   PLT 186 11/11/2014    Recent Labs Lab 11/11/14 0800  NA 138  K 4.2  CL 103  CO2 24  BUN 27*  CREATININE 1.45*  CALCIUM 9.5  PROT 6.9  BILITOT 0.3  ALKPHOS 61  ALT 11  AST 19  GLUCOSE 112*   No results for input(s): CKTOTAL, CKMB, TROPONINI in the last 72 hours. Lab Results  Component Value Date   CHOL 116 10/28/2014   HDL 44 10/28/2014   LDLCALC 52 10/28/2014   TRIG 98 10/28/2014   No results found for: DDIMER  Radiology/Studies:  Dg Chest 2 View  11/11/2014   CLINICAL DATA:  Acute left-sided chest pain.  EXAM: CHEST  2 VIEW  COMPARISON:  None.  FINDINGS: The heart size and mediastinal contours are within normal limits. No pneumothorax or pleural effusion is noted. Left-sided pacemaker is noted. Both lungs are clear. The visualized skeletal structures are unremarkable.  IMPRESSION: No acute cardiopulmonary abnormality seen.   Electronically Signed   By: Sabino Dick M.D.   On: 11/11/2014 08:22     EKG: Atrial sensed, V-paced rhythm (Biventricular pacing)  ASSESSMENT AND PLAN:  1. Precordial chest pain at rest - diff dx includes ACS/unstable angina versus GI (GERD) as most likely etiologies. Pulmonary embolus, aortic dissection seem much less likely with absence of typical features, tachycardia, shortness of breath. Pt has known moderate CAD from cardiac cath in 2006 and has diabetes so clearly at risk for CAD progression. However, she also has had GI upset for about one month now and this seems to correlate with an increase in her metformin dose.   The patient has had abnormal stress nuclear scans in the past with large areas of the anterior infarction noted as well as peri-infarct ischemia. This dates back over a decade. I do not think a nuclear scan will be helpful. Recommend hospital admission, serial cardiac enzymes, hold metformin, gently hydrate for consideration of diagnostic  cardiac catheterization tomorrow. If she feels much better with metformin held, it may be reasonable to discharge her home with close observation as her symptoms could very well be GI related. We'll keep her nothing by mouth after midnight to leave options open for further diagnostic testing. Discussed plan at length with the patient and her niece who is at bedside.  2. Type 2 diabetes. Will hold metformin in case angiography needs to be performed. As noted above, this may be contributing to her GI symptoms.  Continue Januvia. And sliding scale insulin while she is hospitalized.  3. Chronic systolic heart failure with underlying nonischemic cardiomyopathy. She's had an excellent response to cardiac resynchronization. She has no clear signs of volume overload on exam. Will continue her current therapy. Will hold her ARB today and tomorrow in case she requires coronary angiography.  4. CKD, Stage III. On ARB in setting of diabetes. Creatinine is stable. Gently hydrate overnight in case angiography tomorrow.  5. Recent UTI - E Coli - course of Cipro completed.  Signed, Sherren Mocha MD 11/11/2014, 10:54 AM

## 2014-11-12 DIAGNOSIS — I208 Other forms of angina pectoris: Secondary | ICD-10-CM | POA: Diagnosis not present

## 2014-11-12 DIAGNOSIS — R079 Chest pain, unspecified: Secondary | ICD-10-CM | POA: Diagnosis not present

## 2014-11-12 DIAGNOSIS — E1129 Type 2 diabetes mellitus with other diabetic kidney complication: Secondary | ICD-10-CM | POA: Diagnosis not present

## 2014-11-12 DIAGNOSIS — E785 Hyperlipidemia, unspecified: Secondary | ICD-10-CM | POA: Diagnosis not present

## 2014-11-12 DIAGNOSIS — E119 Type 2 diabetes mellitus without complications: Secondary | ICD-10-CM | POA: Diagnosis not present

## 2014-11-12 DIAGNOSIS — I509 Heart failure, unspecified: Secondary | ICD-10-CM | POA: Diagnosis not present

## 2014-11-12 LAB — BASIC METABOLIC PANEL
ANION GAP: 10 (ref 5–15)
BUN: 23 mg/dL (ref 6–23)
CHLORIDE: 102 meq/L (ref 96–112)
CO2: 25 mmol/L (ref 19–32)
Calcium: 8.8 mg/dL (ref 8.4–10.5)
Creatinine, Ser: 1.28 mg/dL — ABNORMAL HIGH (ref 0.50–1.10)
GFR calc Af Amer: 45 mL/min — ABNORMAL LOW (ref >90)
GFR, EST NON AFRICAN AMERICAN: 39 mL/min — AB (ref >90)
Glucose, Bld: 134 mg/dL — ABNORMAL HIGH (ref 70–99)
Potassium: 3.7 mmol/L (ref 3.5–5.1)
SODIUM: 137 mmol/L (ref 135–145)

## 2014-11-12 LAB — URINE CULTURE: Colony Count: 2000

## 2014-11-12 LAB — GLUCOSE, CAPILLARY
GLUCOSE-CAPILLARY: 103 mg/dL — AB (ref 70–99)
GLUCOSE-CAPILLARY: 99 mg/dL (ref 70–99)

## 2014-11-12 LAB — TROPONIN I

## 2014-11-12 MED ORDER — ASPIRIN EC 81 MG PO TBEC
81.0000 mg | DELAYED_RELEASE_TABLET | Freq: Every day | ORAL | Status: DC
  Administered 2014-11-12: 81 mg via ORAL
  Filled 2014-11-12: qty 1

## 2014-11-12 MED ORDER — GLUCERNA SHAKE PO LIQD
237.0000 mL | Freq: Three times a day (TID) | ORAL | Status: DC
  Administered 2014-11-12: 237 mL via ORAL

## 2014-11-12 MED ORDER — METFORMIN HCL ER 500 MG PO TB24: 500.0000 mg | ORAL_TABLET | Freq: Every day | ORAL | Status: DC

## 2014-11-12 NOTE — Discharge Summary (Signed)
CARDIOLOGY DISCHARGE SUMMARY   Patient ID: Priscilla Anderson MRN: [id number] DOB/AGE: 1934/08/30 80 y.o.  Admit date: 11/11/2014 Discharge date: 11/13/2014  PCP: Cathlean Cower, MD Primary Cardiologist: Dr. Sallyanne Kuster  Primary Discharge Diagnosis: chest pain at rest Secondary Discharge Diagnosis:    Anginal chest pain at rest   Type 2 diabetes mellitus with renal manifestations not at goal  Procedures: Two-view chest x-ray  Hospital Course: Priscilla Anderson is a 80 y.o. female with a history NICM, CHB/CRT-D, abnl MV with no sig CAD at cath 2006, DM. She was admitted 12/29 w/ chest pain at rest.  Her cardiac enzymes were negative for MI. The situation was reviewed with the patient and her son. She had been sick recently with a UTI and had been off schedule with her diabetes and other medications because of the antibiotics. She also was having some problems with GI upset from the antibiotics. The symptoms had improved and she was having no more chest pain or shortness of breath.  On 12/30, she was seen by Dr. Marlou Porch and all data were reviewed. She ambulated well and had no chest pain or shortness of breath with ambulation. Her chest x-ray and other labs were reviewed and showed no critical abnormalities. No further inpatient workup was indicated and she is considered stable for discharge, to follow up as an outpatient.  Labs:   Lab Results  Component Value Date   WBC 6.3 11/11/2014   HGB 9.5* 11/11/2014   HCT 30.6* 11/11/2014   MCV 94.2 11/11/2014   PLT 209 11/11/2014    Recent Labs Lab 11/11/14 0800  11/12/14 0145  NA 138  --  137  K 4.2  --  3.7  CL 103  --  102  CO2 24  --  25  BUN 27*  --  23  CREATININE 1.45*  < > 1.28*  CALCIUM 9.5  --  8.8  PROT 6.9  --   --   BILITOT 0.3  --   --   ALKPHOS 61  --   --   ALT 11  --   --   AST 19  --   --   GLUCOSE 112*  --  134*  < > = values in this interval not displayed.  Recent Labs  11/11/14 1433 11/11/14 1937  11/12/14 0145  TROPONINI <0.03 <0.03 <0.03   Lipid Panel     Component Value Date/Time   CHOL 116 10/28/2014 0830   TRIG 98 10/28/2014 0830   HDL 44 10/28/2014 0830   CHOLHDL 2.6 10/28/2014 0830   VLDL 20 10/28/2014 0830   LDLCALC 52 10/28/2014 0830      Radiology: Dg Chest 2 View 11/11/2014   CLINICAL DATA:  Acute left-sided chest pain.  EXAM: CHEST  2 VIEW  COMPARISON:  None.  FINDINGS: The heart size and mediastinal contours are within normal limits. No pneumothorax or pleural effusion is noted. Left-sided pacemaker is noted. Both lungs are clear. The visualized skeletal structures are unremarkable.  IMPRESSION: No acute cardiopulmonary abnormality seen.   Electronically Signed   By: Sabino Dick M.D.   On: 11/11/2014 08:22   EKG: 11/12/2014 Biventricular pacing  FOLLOW UP PLANS AND APPOINTMENTS No Known Allergies   Medication List    STOP taking these medications        ciprofloxacin 250 MG tablet  Commonly known as:  CIPRO      TAKE these medications        aspirin  81 MG tablet  Take 81 mg by mouth daily.     BENEFIBER Powd  Take by mouth. 2 tablespoons once daily     carvedilol 12.5 MG tablet  Commonly known as:  COREG  TAKE 1 TABLET TWICE A DAY     diazepam 5 MG tablet  Commonly known as:  VALIUM  Take 5 mg by mouth at bedtime as needed for anxiety.     digoxin 0.125 MG tablet  Commonly known as:  DIGITEK  Take 1 tablet (125 mcg total) by mouth every other day.     furosemide 40 MG tablet  Commonly known as:  LASIX  Take 40-60 mg by mouth daily. Alternating between 40 mg & 60 mg every other day     glucose blood test strip  Commonly known as:  ACCU-CHEK AVIVA  Test two times daily. Code 250.02     ibandronate 150 MG tablet  Commonly known as:  BONIVA  Take 150 mg by mouth every 30 (thirty) days. Take in the morning with a full glass of water, on an empty stomach, and do not take anything else by mouth or lie down for the next 30 min.     JANUVIA  100 MG tablet  Generic drug:  sitaGLIPtin  TAKE 1 TABLET DAILY     Lancets Misc  Test as directed two times daily. Code 250.02     losartan 50 MG tablet  Commonly known as:  COZAAR  Take 1 tablet (50 mg total) by mouth daily.     metFORMIN 500 MG 24 hr tablet  Commonly known as:  GLUCOPHAGE-XR  Take 1 tablet (500 mg total) by mouth daily with breakfast.     potassium chloride SA 20 MEQ tablet  Commonly known as:  K-DUR,KLOR-CON  TAKE 1 TABLET DAILY     REFRESH OPTIVE OP  Apply 1 drop to eye at bedtime.     simvastatin 20 MG tablet  Commonly known as:  ZOCOR  TAKE 1 TABLET AT BEDTIME     SOOTHE XP Soln  Apply 1 drop to eye daily as needed (dry eyes).     spironolactone 25 MG tablet  Commonly known as:  ALDACTONE  TAKE ONE-HALF TABLET (12.5 MG) DAILY     vitamin C 500 MG tablet  Commonly known as:  ASCORBIC ACID  Take 500 mg by mouth daily.        Discharge Instructions    (HEART FAILURE PATIENTS) Call MD:  Anytime you have any of the following symptoms: 1) 3 pound weight gain in 24 hours or 5 pounds in 1 week 2) shortness of breath, with or without a dry hacking cough 3) swelling in the hands, feet or stomach 4) if you have to sleep on extra pillows at night in order to breathe.    Complete by:  As directed      Diet - low sodium heart healthy    Complete by:  As directed      Diet Carb Modified    Complete by:  As directed      Increase activity slowly    Complete by:  As directed           Follow-up Information    Follow up with Sanda Klein, MD.   Specialty:  Cardiology   Why:  The office will call.   Contact information:   [address] [place] [address] [phone]       BRING ALL MEDICATIONS WITH YOU  TO FOLLOW UP APPOINTMENTS  Time spent with patient to include physician time: 38 min Signed: Rosaria Ferries, PA-C 11/13/2014, 4:30 PM Co-Sign MD  Personally seen and examined. Agree with above. Candee Furbish, MD

## 2014-11-12 NOTE — Discharge Summary (Signed)
Discharge instructions reviewed with patient and son at bedside. Questions answered and further questions denied. AVS given to patient. IV and telemetry discontinued. Patient to get dressed then to call when ready to be transported to main entrance where son will be waiting to drive her home.

## 2014-11-12 NOTE — Progress Notes (Signed)
Patient Name: Priscilla Anderson Date of Encounter: 11/12/2014  Active Problems:   Anginal chest pain at rest   Type 2 diabetes mellitus with renal manifestations not at goal   Chest pain at rest   Primary Cardiologist: Dr. Sallyanne Kuster  Patient Profile: 80 yo female w/ hx NICM, CHB/CRT-D, abnl MV with no sig CAD at cath 2006, DM, was admitted 12/29 w/ chest pain at rest.  SUBJECTIVE: Pt no chest pain. Feels symptoms related to recent abx for UTI, other meds off schedule due to holidays. No recent weight issues or SOB, no history of exertional chest pain.   OBJECTIVE Filed Vitals:   11/11/14 1445 11/11/14 1933 11/12/14 0523 11/12/14 1055  BP:  98/52 97/54 109/62  Pulse: 74 70 72   Temp:  98.5 F (36.9 C) 98 F (36.7 C)   TempSrc:  Oral Oral   Resp:  16 16   Height:      Weight:   106 lb 12.8 oz (48.444 kg)   SpO2:  99% 94%     Intake/Output Summary (Last 24 hours) at 11/12/14 1111 Last data filed at 11/12/14 0100  Gross per 24 hour  Intake    240 ml  Output   1175 ml  Net   -935 ml   Filed Weights   11/11/14 0743 11/11/14 1347 11/12/14 0523  Weight: 110 lb (49.896 kg) 105 lb 3.2 oz (47.718 kg) 106 lb 12.8 oz (48.444 kg)    PHYSICAL EXAM General: Well developed, well nourished, female in no acute distress. Head: Normocephalic, atraumatic.  Neck: Supple without bruits, JVD at 9 cm. Lungs:  Resp regular and unlabored, few rales bases, good air exchange. Heart: RRR, S1, S2, no S3, ?S4, 2/6 murmur; no rub. Abdomen: Soft, non-tender, non-distended, BS + x 4.  Extremities: No clubbing, cyanosis, no edema.  Neuro: Alert and oriented X 3. Moves all extremities spontaneously. Psych: Normal affect.  LABS: CBC: Recent Labs  11/11/14 0800 11/11/14 1433  WBC 7.7 6.3  NEUTROABS 4.5  --   HGB 10.0* 9.5*  HCT 31.9* 30.6*  MCV 94.1 94.2  PLT 186 517   Basic Metabolic Panel: Recent Labs  11/11/14 0800 11/11/14 1433 11/12/14 0145  NA 138  --  137  K 4.2  --  3.7    CL 103  --  102  CO2 24  --  25  GLUCOSE 112*  --  134*  BUN 27*  --  23  CREATININE 1.45* 1.66* 1.28*  CALCIUM 9.5  --  8.8   Liver Function Tests: Recent Labs  11/11/14 0800  AST 19  ALT 11  ALKPHOS 61  BILITOT 0.3  PROT 6.9  ALBUMIN 3.7   Cardiac Enzymes: Recent Labs  11/11/14 1433 11/11/14 1937 11/12/14 0145  TROPONINI <0.03 <0.03 <0.03    Recent Labs  11/11/14 0812  TROPIPOC 0.01    TELE:  Biv pacing  Radiology/Studies: Dg Chest 2 View  11/11/2014   CLINICAL DATA:  Acute left-sided chest pain.  EXAM: CHEST  2 VIEW  COMPARISON:  None.  FINDINGS: The heart size and mediastinal contours are within normal limits. No pneumothorax or pleural effusion is noted. Left-sided pacemaker is noted. Both lungs are clear. The visualized skeletal structures are unremarkable.  IMPRESSION: No acute cardiopulmonary abnormality seen.   Electronically Signed   By: Sabino Dick M.D.   On: 11/11/2014 08:22     Current Medications:  . aspirin EC  81 mg Oral Daily  .  carvedilol  12.5 mg Oral BID  . digoxin  125 mcg Oral QODAY  . feeding supplement (ENSURE COMPLETE)  237 mL Oral BID BM  . heparin  5,000 Units Subcutaneous 3 times per day  . insulin aspart  0-9 Units Subcutaneous TID WC  . linagliptin  5 mg Oral Daily  . pantoprazole  40 mg Oral Daily  . potassium chloride SA  20 mEq Oral Daily  . simvastatin  20 mg Oral QHS  . spironolactone  25 mg Oral Daily      ASSESSMENT AND PLAN: Active Problems:   Anginal chest pain at rest - ez negative for MI, no recurrence with ambulation. Stress test 2013 with scar, mild ischemia, med rx and has done well. Non-critical CAD at cath 2006 (no details available). Discussed options of cath (risk/benefits), MV and med rx with OP f/u and further testing at that time if warranted. Since she ambulated well and is asymptomatic, pt prefers med rx, f/u as OP and consider further testing at that time, if indicated.    Type 2 diabetes mellitus  with renal manifestations not at goal - pt says sugars generally are well-controlled, recent UTI threw her off.    Chest pain at rest - see above  Plan - consider d/c, OK to eat/drink  Signed, Rosaria Ferries , PA-C 11:11 AM 11/12/2014  Personally seen and examined. Agree with above. OK with continued med mgt and DC.  Candee Furbish, MD

## 2014-11-12 NOTE — Progress Notes (Signed)
UR completed 

## 2014-11-13 ENCOUNTER — Telehealth: Payer: Self-pay | Admitting: Cardiovascular Disease

## 2014-11-13 NOTE — Telephone Encounter (Signed)
Closed encounter °

## 2014-11-19 ENCOUNTER — Encounter: Payer: Self-pay | Admitting: Cardiovascular Disease

## 2014-11-20 ENCOUNTER — Encounter: Payer: Self-pay | Admitting: Internal Medicine

## 2014-11-20 ENCOUNTER — Other Ambulatory Visit: Payer: Self-pay

## 2014-11-20 ENCOUNTER — Other Ambulatory Visit: Payer: Self-pay | Admitting: Internal Medicine

## 2014-11-20 ENCOUNTER — Other Ambulatory Visit (INDEPENDENT_AMBULATORY_CARE_PROVIDER_SITE_OTHER): Payer: Medicare Other

## 2014-11-20 ENCOUNTER — Ambulatory Visit (INDEPENDENT_AMBULATORY_CARE_PROVIDER_SITE_OTHER): Payer: Medicare Other | Admitting: Internal Medicine

## 2014-11-20 VITALS — BP 122/68 | HR 92 | Temp 97.9°F | Wt 108.0 lb

## 2014-11-20 DIAGNOSIS — R634 Abnormal weight loss: Secondary | ICD-10-CM

## 2014-11-20 DIAGNOSIS — F411 Generalized anxiety disorder: Secondary | ICD-10-CM

## 2014-11-20 DIAGNOSIS — N390 Urinary tract infection, site not specified: Secondary | ICD-10-CM | POA: Diagnosis not present

## 2014-11-20 DIAGNOSIS — T8351XA Infection and inflammatory reaction due to indwelling urinary catheter, initial encounter: Secondary | ICD-10-CM

## 2014-11-20 DIAGNOSIS — T83511A Infection and inflammatory reaction due to indwelling urethral catheter, initial encounter: Secondary | ICD-10-CM

## 2014-11-20 LAB — URINALYSIS, ROUTINE W REFLEX MICROSCOPIC
Bilirubin Urine: NEGATIVE
HGB URINE DIPSTICK: NEGATIVE
Ketones, ur: NEGATIVE
Nitrite: NEGATIVE
RBC / HPF: NONE SEEN (ref 0–?)
SPECIFIC GRAVITY, URINE: 1.01 (ref 1.000–1.030)
Total Protein, Urine: NEGATIVE
URINE GLUCOSE: NEGATIVE
UROBILINOGEN UA: 0.2 (ref 0.0–1.0)
pH: 6.5 (ref 5.0–8.0)

## 2014-11-20 MED ORDER — PAROXETINE HCL 10 MG PO TABS: 10.0000 mg | ORAL_TABLET | Freq: Every day | ORAL | Status: DC

## 2014-11-20 MED ORDER — CIPROFLOXACIN HCL 250 MG PO TABS: 250.0000 mg | ORAL_TABLET | Freq: Two times a day (BID) | ORAL | Status: DC

## 2014-11-20 NOTE — Assessment & Plan Note (Signed)
Ok for f/u UA today, exam benign,  to f/u any worsening symptoms or concerns

## 2014-11-20 NOTE — Assessment & Plan Note (Signed)
Etiology unclear, and not clear to me she has actually lost significant wt;   Differential in her case includes cardiac cachexia, med related, DM out of control, GI distress, infection /uti, stress/anxiety, thyroid or even malignancy.  At this time would be fairly conservative, has had significant recent labs including TSH, today for UA f/u , also SPEP;

## 2014-11-20 NOTE — Progress Notes (Signed)
Subjective:    Patient ID: Priscilla Anderson, female    DOB: 26-Dec-1933, 81 y.o.   MRN: [id number]  HPI  Here to f/u, recently hospd with CP, here for wt loss, can "feel it in my clothes and stomach."   Wt Readings from Last 3 Encounters:  11/20/14 108 lb (48.988 kg)  11/12/14 106 lb 12.8 oz (48.444 kg)  10/28/14 110 lb 1.6 oz (49.941 kg)  Tx for UTI recent per card, pt asympt, asks for f/u, did not actually get the antibx for some time, but finished just prior to dec 25, had abd pain and lower appetite at that time, "I had to make myself eat."  Urine cx on EMR from dec 29 negative for growth, after urine cx dec 17 c/w e coli, pansensitive, tx with macrobid.  Recent blood sugars increased overall mildly, with last a1c 3 wks ago at 7.4. Has been taking increased OHA since dec 28 due to delay getting med through [facility]. CBG since then improved in low 100's, such as 134, 123. 118, 128, 122, 124, 108.  Pt very diligent with her sugars.   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  Fortunately all closed on dec 12, but did have signficant GI distress/nausea intermittent during the stress, improved now. Denies worsening reflux, abd pain, dysphagia, n/v, bowel change or blood. Past Medical History  Diagnosis Date  . Nonischemic cardiomyopathy 09/22/2011  . CHF (congestive heart failure)   . Hyperlipidemia   . Hypertension   . Osteopenia   . Adenomatous colon polyp   . Polyp of larynx   . CAD (coronary artery disease)   . AICD (automatic cardioverter/defibrillator) present   . Type II diabetes mellitus    Past Surgical History  Procedure Laterality Date  . Cardiac defibrillator placement  05/21/2002    s/p re-do pacemaker/defibrillator October 2011- [place].  . Colonoscopy    . Implantable  cardioverter defibrillator generator change  09/01/2010    Medtronic  . Biv icd genertaor change out N/A 07/29/2014    Procedure: BIV ICD GENERTAOR CHANGE OUT;  Surgeon: Sanda Klein, MD;  Location: [facility] CATH LAB;  Service: Cardiovascular;  Laterality: N/A;  . Vaginal hysterectomy    . Breast cyst excision Left 1980  . Cardiac catheterization  07/28/2005    noncritical CAD  . Cardiac catheterization      "I've had several"  . Microlaryngoscopy with co2 laser and excision of vocal cord lesion  08/2002    /notes 03/29/2011    reports that she has never smoked. She has never used smokeless tobacco. She reports that she does not drink alcohol or use illicit drugs. family history includes Arthritis in her father and mother; Diabetes in her other; Heart disease in her brother; Prostate cancer in her brother. No Known Allergies  Review of Systems  Constitutional: Negative for unusual diaphoresis or other sweats  HENT: Negative for ringing in ear Eyes: Negative for double vision or worsening visual disturbance.  Respiratory: Negative for choking and stridor.   Gastrointestinal: Negative for vomiting or other signifcant bowel change Genitourinary: Negative for hematuria or decreased urine volume.  Musculoskeletal: Negative for other MSK pain or swelling Skin: Negative for color change and worsening wound.  Neurological: Negative for tremors and numbness other than noted  Psychiatric/Behavioral: Negative for decreased  concentration or agitation other than above       Objective:   Physical Exam BP 122/68 mmHg  Pulse 92  Temp(Src) 97.9 F (36.6 C)  Wt 108 lb (48.988 kg)  SpO2 98% VS noted,  Constitutional: Pt appears well-developed, well-nourished.  HENT: Head: NCAT.  Right Ear: External ear normal.  Left Ear: External ear normal.  Eyes: . Pupils are equal, round, and reactive to light. Conjunctivae and EOM are normal Neck: Normal range of motion. Neck supple.  Cardiovascular: Normal  rate and regular rhythm.   Pulmonary/Chest: Effort normal and breath sounds without rales or wheezing.  Abd:  Soft, NT, ND, + BS Neurological: Pt is alert. Not confused , motor grossly intact Skin: Skin is warm. No rash Psychiatric: Pt behavior is normal. No agitation. 1-2+ nervous      Assessment & Plan:

## 2014-11-20 NOTE — Progress Notes (Signed)
Pre visit review using our clinic review tool, if applicable. No additional management support is needed unless otherwise documented below in the visit note. 

## 2014-11-20 NOTE — Patient Instructions (Addendum)
Please take all new medication as prescribed - the paxil at 10 mg per day  Please continue all other medications as before, and refills have been done if requested.  Please have the pharmacy call with any other refills you may need.  Please keep your appointments with your specialists as you may have planned  Please go to the LAB in the Basement (turn left off the elevator) for the tests to be done today  You will be contacted by phone if any changes need to be made immediately.  Otherwise, you will receive a letter about your results with an explanation, but please check with MyChart first.  Please remember to sign up for MyChart if you have not done so, as this will be important to you in the future with finding out test results, communicating by private email, and scheduling acute appointments online when needed.  Please return in 3 months, or sooner if needed

## 2014-11-20 NOTE — Assessment & Plan Note (Signed)
Ok to add paxil 10 qd, which may help with wt gain as well

## 2014-11-24 LAB — PROTEIN ELECTROPHORESIS, SERUM
ALPHA-1-GLOBULIN: 4.9 % (ref 2.9–4.9)
ALPHA-2-GLOBULIN: 13.2 % — AB (ref 7.1–11.8)
Albumin ELP: 57.6 % (ref 55.8–66.1)
BETA 2: 5.9 % (ref 3.2–6.5)
Beta Globulin: 6.3 % (ref 4.7–7.2)
GAMMA GLOBULIN: 12.1 % (ref 11.1–18.8)
TOTAL PROTEIN, SERUM ELECTROPHOR: 7 g/dL (ref 6.0–8.3)

## 2014-12-11 ENCOUNTER — Ambulatory Visit (INDEPENDENT_AMBULATORY_CARE_PROVIDER_SITE_OTHER): Payer: Medicare Other | Admitting: Cardiovascular Disease

## 2014-12-11 ENCOUNTER — Encounter: Payer: Self-pay | Admitting: Cardiovascular Disease

## 2014-12-11 VITALS — BP 102/62 | HR 93 | Resp 16 | Ht 64.0 in | Wt 112.0 lb

## 2014-12-11 DIAGNOSIS — Z95810 Presence of automatic (implantable) cardiac defibrillator: Secondary | ICD-10-CM

## 2014-12-11 DIAGNOSIS — I420 Dilated cardiomyopathy: Secondary | ICD-10-CM

## 2014-12-11 DIAGNOSIS — I2510 Atherosclerotic heart disease of native coronary artery without angina pectoris: Secondary | ICD-10-CM | POA: Diagnosis not present

## 2014-12-11 DIAGNOSIS — I429 Cardiomyopathy, unspecified: Secondary | ICD-10-CM | POA: Diagnosis not present

## 2014-12-11 DIAGNOSIS — I5022 Chronic systolic (congestive) heart failure: Secondary | ICD-10-CM

## 2014-12-11 NOTE — Progress Notes (Signed)
Patient ID: Priscilla Anderson, female   DOB: 1934-11-14, 81 y.o.   MRN: [id number]     Reason for office visit Nonischemic dilated cardiomyopathy, complete heart block, biventricular pacemaker/defibrillator, CAD  Since her last appointment, Priscilla Anderson feels much improved. She is "feeling like her old self again". Her appetite has improved substantially. She has actually managed to put on several pounds. Since her appointment in December she has reduced the dose of digoxin and was treated for urinary tract infection. She has not had any episodes of heart failure. She was briefly hospitalized for atypical chest discomfort but her workup was reassuring.  She has a long-standing history of nonischemic cardiomyopathy. She has complete heart block. Her initial CRT pacemaker was placed in 5170, complicated by late coronary sinus lead dislodgment in 2006. In 2007, it was upgraded to a biventricular ICD (Medtronic [place]) with an epicardial left ventricular lead in 2007 (Medtronic [phone]). 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 She has known occlusion of left subclavian vein. 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 She has a Medtronic Sprint Fidelis [id number] dual coil active fixation defibrillator lead that is under advisory. For this reason her old RV pacemaker lead (Medtronic [id number] implanted 2003) was used for pace-sense features, the Sprint Fidelis lead was left in place for tachycardia therapies only. The right atrial lead is the original one placed in 2003 (Medtronic [id number])  She has responded very well to cardiac resynchronization therapy. She has well compensated heart failure (NYHA functional class I) despite a very low left ventricular ejection fraction, most recently estimated to be 25-30% by an echocardiogram in July  of this 2014. She has moderate eccentric mitral insufficiency that is felt to be most likely secondary to cardiomyopathy. Coronary angiography in 2006 showed scattered mild to moderate coronary stenoses (40-50% LAD, 30% RCA, 2006). Her nuclear stress test in December of 2013 was interpreted as showing a large area of anterior scar and this has been seen on nuclear stress tests performed as far back as 2004. By scintigraphy left ventricular ejection fraction was only 17%.     No Known Allergies  Current Outpatient Prescriptions  Medication Sig Dispense Refill  . Artificial Tear Solution (SOOTHE XP) SOLN Apply 1 drop to eye daily as needed (dry eyes).    . Ascorbic Acid (VITAMIN C) 500 MG tablet Take 500 mg by mouth daily.      Marland Kitchen aspirin 81 MG tablet Take 81 mg by mouth daily.      . Carboxymethylcellul-Glycerin (REFRESH OPTIVE OP) Apply 1 drop to eye at bedtime.    . carvedilol (COREG) 12.5 MG tablet TAKE 1 TABLET TWICE A DAY 180 tablet 2  . diazepam (VALIUM) 5 MG tablet Take 5 mg by mouth at bedtime as needed for anxiety.    . digoxin (DIGITEK) 0.125 MG tablet Take 1 tablet (125 mcg total) by mouth every other day. 45 tablet 2  . furosemide (LASIX) 40 MG tablet Take 40-60 mg by mouth daily. Alternating between 40 mg & 60 mg every other day    . glucose blood (ACCU-CHEK AVIVA) test strip Test two times daily. Code 250.02 100 each 11  . ibandronate (BONIVA) 150 MG tablet Take 150 mg by mouth every 30 (thirty) days. Take in the morning with a full glass of water, on an  empty stomach, and do not take anything else by mouth or lie down for the next 30 min.    Marland Kitchen JANUVIA 100 MG tablet TAKE 1 TABLET DAILY 90 tablet 2  . Lancets MISC Test as directed two times daily. Code 250.02 100 each 11  . losartan (COZAAR) 50 MG tablet Take 1 tablet (50 mg total) by mouth daily. 90 tablet 3  . metFORMIN (GLUCOPHAGE-XR) 500 MG 24 hr tablet Take 1 tablet (500 mg total) by mouth daily with breakfast. 180 tablet 3  .  potassium chloride SA (K-DUR,KLOR-CON) 20 MEQ tablet TAKE 1 TABLET DAILY 90 tablet 3  . simvastatin (ZOCOR) 20 MG tablet TAKE 1 TABLET AT BEDTIME 30 tablet 9  . spironolactone (ALDACTONE) 25 MG tablet TAKE ONE-HALF TABLET (12.5 MG) DAILY 45 tablet 2  . Wheat Dextrin (BENEFIBER) POWD Take by mouth. 2 tablespoons once daily     No current facility-administered medications for this visit.    Past Medical History  Diagnosis Date  . Nonischemic cardiomyopathy 09/22/2011  . CHF (congestive heart failure)   . Hyperlipidemia   . Hypertension   . Osteopenia   . Adenomatous colon polyp   . Polyp of larynx   . CAD (coronary artery disease)   . AICD (automatic cardioverter/defibrillator) present   . Type II diabetes mellitus     Past Surgical History  Procedure Laterality Date  . Cardiac defibrillator placement  05/21/2002    s/p re-do pacemaker/defibrillator October 2011- [place].  . Colonoscopy    . Implantable cardioverter defibrillator generator change  09/01/2010    Medtronic  . Biv icd genertaor change out N/A 07/29/2014    Procedure: BIV ICD GENERTAOR CHANGE OUT;  Surgeon: Sanda Klein, MD;  Location: [facility] CATH LAB;  Service: Cardiovascular;  Laterality: N/A;  . Vaginal hysterectomy    . Breast cyst excision Left 1980  . Cardiac catheterization  07/28/2005    noncritical CAD  . Cardiac catheterization      "I've had several"  . Microlaryngoscopy with co2 laser and excision of vocal cord lesion  08/2002    /notes 03/29/2011    Family History  Problem Relation Age of Onset  . Arthritis Mother   . Arthritis Father   . Heart disease Brother     CABG  . Prostate cancer Brother     Prostate problem with up and down PSA  . Diabetes Other     1st degree    History   Social History  . Marital Status: Widowed    Spouse Name: N/A    Number of Children: 2  . Years of Education: N/A   Occupational History  . retired Investment banker, corporate. report clerk    Social History Main  Topics  . Smoking status: Never Smoker   . Smokeless tobacco: Never Used  . Alcohol Use: No  . Drug Use: No  . Sexual Activity: Not on file   Other Topics Concern  . Not on file   Social History Narrative   Husband now with Multiple Myeloma- see Dr. Juanita Craver    Review of systems: The patient specifically denies any chest pain at rest or with exertion, dyspnea at rest or with exertion, orthopnea, paroxysmal nocturnal dyspnea, syncope, palpitations, focal neurological deficits, intermittent claudication, lower extremity edema, unexplained weight gain, cough, hemoptysis or wheezing.  The patient also denies abdominal pain, nausea, vomiting, dysphagia, diarrhea, constipation, polyuria, polydipsia, dysuria, hematuria, frequency, urgency, abnormal bleeding or bruising, fever, chills, unexpected weight changes, mood  swings, change in skin or hair texture, change in voice quality, auditory or visual problems, allergic reactions or rashes, new musculoskeletal complaints other than usual "aches and pains".   PHYSICAL EXAM BP 102/62 mmHg  Pulse 93  Resp 16  Ht 5' 4"  (1.626 m)  Wt 112 lb (50.803 kg)  BMI 19.22 kg/m2 General: Alert, oriented x3, no distress  Head: no evidence of trauma, PERRL, EOMI, no exophtalmos or lid lag, no myxedema, no xanthelasma; normal ears, nose and oropharynx  Neck: normal jugular venous pulsations and no hepatojugular reflux; brisk carotid pulses without delay and no carotid bruits  Chest: clear to auscultation, no signs of consolidation by percussion or palpation, normal fremitus, symmetrical and full respiratory excursions; the left subclavian ICD scar is healthy, the device has slipped into her breast. Collateral venous circulation is seen over the left anterior chest.  Cardiovascular: Inferolaterally displaced and heaving apical impulse, regular rhythm, normal first and paradoxically split second heart sounds, grade 7-6/8 whole systolic apical  murmur , No rubs or gallops  Abdomen: no tenderness or distention, no masses by palpation, no abnormal pulsatility or arterial bruits, normal bowel sounds, no hepatosplenomegaly  Extremities: no clubbing, cyanosis or edema; 2+ radial, ulnar and brachial pulses bilaterally; 2+ right femoral, posterior tibial and dorsalis pedis pulses; 2+ left femoral, posterior tibial and dorsalis pedis pulses; no subclavian or femoral bruits  Neurological: grossly nonfocal   EKG: atrial sensed biventricular paced   Lipid Panel     Component Value Date/Time   CHOL 116 10/28/2014 0830   TRIG 98 10/28/2014 0830   HDL 44 10/28/2014 0830   CHOLHDL 2.6 10/28/2014 0830   VLDL 20 10/28/2014 0830   LDLCALC 52 10/28/2014 0830    BMET    Component Value Date/Time   NA 137 11/12/2014 0145   K 3.7 11/12/2014 0145   CL 102 11/12/2014 0145   CO2 25 11/12/2014 0145   GLUCOSE 134* 11/12/2014 0145   BUN 23 11/12/2014 0145   CREATININE 1.28* 11/12/2014 0145   CREATININE 1.40* 10/28/2014 0830   CALCIUM 8.8 11/12/2014 0145   GFRNONAA 39* 11/12/2014 0145   GFRAA 45* 11/12/2014 0145     ASSESSMENT AND PLAN  I'm not certain whether Mrs. Ange's improvement is related to a resolution of probable digitalis toxicity versus treatment of her urinary tract infection. Due to the chronicity of her weight loss and gastrointestinal complaints, I suspect digitalis was causing side effects. There is no evidence of worsening heart failure since we cut the dose of digoxin back. We'll try to wean her off of it completely and do not plan to restart this medication.  We'll perform a remote device check in 3 months and have her follow-up in the clinic in 6 months.  Holli Humbles, MD, [place] [phone] office [phone] pager

## 2014-12-11 NOTE — Patient Instructions (Signed)
STOP the Digoxin.  Dr. Sallyanne Kuster recommends that you schedule a follow-up appointment in: 6 months.

## 2015-01-20 ENCOUNTER — Other Ambulatory Visit: Payer: Self-pay | Admitting: Cardiovascular Disease

## 2015-01-20 MED ORDER — SIMVASTATIN 20 MG PO TABS: 20.0000 mg | ORAL_TABLET | Freq: Every day | ORAL | Status: DC

## 2015-01-20 NOTE — Telephone Encounter (Signed)
°  1. Which medications need to be refilled? Simvastatin -new prescription  2. Which pharmacy is medication to be sent to?[facility] 3. Do they need a 30 day or 90 day supply? 90 and refills  4. Would they like a call back once the medication has been sent to the pharmacy? yes

## 2015-01-20 NOTE — Telephone Encounter (Signed)
Rx(s) sent to pharmacy electronically. Patient notified. 

## 2015-01-28 ENCOUNTER — Ambulatory Visit (INDEPENDENT_AMBULATORY_CARE_PROVIDER_SITE_OTHER): Payer: Medicare Other | Admitting: *Deleted

## 2015-01-28 ENCOUNTER — Telehealth: Payer: Self-pay | Admitting: Cardiology

## 2015-01-28 DIAGNOSIS — I429 Cardiomyopathy, unspecified: Secondary | ICD-10-CM

## 2015-01-28 DIAGNOSIS — I420 Dilated cardiomyopathy: Secondary | ICD-10-CM

## 2015-01-28 DIAGNOSIS — I5022 Chronic systolic (congestive) heart failure: Secondary | ICD-10-CM

## 2015-01-28 LAB — MDC_IDC_ENUM_SESS_TYPE_REMOTE
Battery Voltage: 2.98 V
Brady Statistic AP VP Percent: 2.09 %
Brady Statistic AP VS Percent: 0.01 %
Brady Statistic AS VP Percent: 97.46 %
Brady Statistic AS VS Percent: 0.43 %
HIGH POWER IMPEDANCE MEASURED VALUE: 37 Ohm
HighPow Impedance: 56 Ohm
Lead Channel Impedance Value: 304 Ohm
Lead Channel Impedance Value: 304 Ohm
Lead Channel Impedance Value: 399 Ohm
Lead Channel Impedance Value: 4047 Ohm
Lead Channel Impedance Value: 4047 Ohm
Lead Channel Pacing Threshold Amplitude: 0.625 V
Lead Channel Pacing Threshold Amplitude: 1.5 V
Lead Channel Pacing Threshold Pulse Width: 0.4 ms
Lead Channel Pacing Threshold Pulse Width: 0.4 ms
Lead Channel Sensing Intrinsic Amplitude: 3.625 mV
Lead Channel Sensing Intrinsic Amplitude: 3.625 mV
Lead Channel Setting Pacing Amplitude: 2 V
Lead Channel Setting Pacing Amplitude: 3.5 V
Lead Channel Setting Pacing Pulse Width: 0.4 ms
Lead Channel Setting Sensing Sensitivity: 0.3 mV
MDC IDC MSMT BATTERY REMAINING LONGEVITY: 64 mo
MDC IDC MSMT LEADCHNL RA IMPEDANCE VALUE: 418 Ohm
MDC IDC MSMT LEADCHNL RA PACING THRESHOLD PULSEWIDTH: 0.4 ms
MDC IDC MSMT LEADCHNL RV PACING THRESHOLD AMPLITUDE: 1.5 V
MDC IDC MSMT LEADCHNL RV SENSING INTR AMPL: 14.375 mV
MDC IDC MSMT LEADCHNL RV SENSING INTR AMPL: 14.375 mV
MDC IDC SESS DTM: 20160316171725
MDC IDC SET LEADCHNL LV PACING AMPLITUDE: 2.5 V
MDC IDC SET LEADCHNL LV PACING PULSEWIDTH: 0.4 ms
MDC IDC STAT BRADY RA PERCENT PACED: 2.1 %
MDC IDC STAT BRADY RV PERCENT PACED: 99.53 %
Zone Setting Detection Interval: 300 ms
Zone Setting Detection Interval: 350 ms
Zone Setting Detection Interval: 370 ms
Zone Setting Detection Interval: 450 ms

## 2015-01-28 NOTE — Telephone Encounter (Signed)
LMOVM for pt to return call 

## 2015-01-28 NOTE — Telephone Encounter (Signed)
Spoke with pt and reminded pt of remote transmission that is due today. Pt verbalized understanding.   

## 2015-01-28 NOTE — Telephone Encounter (Signed)
Informed pt that she needs to call medtronic tech services.

## 2015-01-29 NOTE — Progress Notes (Signed)
Remote ICD transmission.   

## 2015-02-04 ENCOUNTER — Encounter: Payer: Self-pay | Admitting: *Deleted

## 2015-02-12 ENCOUNTER — Telehealth: Payer: Self-pay | Admitting: Cardiovascular Disease

## 2015-02-13 ENCOUNTER — Encounter: Payer: Self-pay | Admitting: Cardiovascular Disease

## 2015-02-13 NOTE — Telephone Encounter (Signed)
Closed encounter °

## 2015-03-05 ENCOUNTER — Encounter: Payer: Self-pay | Admitting: Internal Medicine

## 2015-03-05 ENCOUNTER — Other Ambulatory Visit (INDEPENDENT_AMBULATORY_CARE_PROVIDER_SITE_OTHER): Payer: Medicare Other

## 2015-03-05 ENCOUNTER — Ambulatory Visit (INDEPENDENT_AMBULATORY_CARE_PROVIDER_SITE_OTHER): Payer: Medicare Other | Admitting: Internal Medicine

## 2015-03-05 VITALS — BP 112/64 | HR 85 | Temp 98.1°F | Resp 18 | Ht 64.0 in | Wt 119.2 lb

## 2015-03-05 DIAGNOSIS — I2510 Atherosclerotic heart disease of native coronary artery without angina pectoris: Secondary | ICD-10-CM

## 2015-03-05 DIAGNOSIS — D649 Anemia, unspecified: Secondary | ICD-10-CM

## 2015-03-05 DIAGNOSIS — E785 Hyperlipidemia, unspecified: Secondary | ICD-10-CM | POA: Diagnosis not present

## 2015-03-05 DIAGNOSIS — E119 Type 2 diabetes mellitus without complications: Secondary | ICD-10-CM | POA: Diagnosis not present

## 2015-03-05 DIAGNOSIS — I10 Essential (primary) hypertension: Secondary | ICD-10-CM

## 2015-03-05 LAB — MICROALBUMIN / CREATININE URINE RATIO
CREATININE, U: 33.5 mg/dL
Microalb Creat Ratio: 2.1 mg/g (ref 0.0–30.0)

## 2015-03-05 LAB — BASIC METABOLIC PANEL
BUN: 32 mg/dL — ABNORMAL HIGH (ref 6–23)
CO2: 31 mEq/L (ref 19–32)
CREATININE: 1.58 mg/dL — AB (ref 0.40–1.20)
Calcium: 10.2 mg/dL (ref 8.4–10.5)
Chloride: 101 mEq/L (ref 96–112)
GFR: 40.56 mL/min — ABNORMAL LOW (ref >60.00)
GLUCOSE: 107 mg/dL — AB (ref 70–99)
Potassium: 5 mEq/L (ref 3.5–5.1)
Sodium: 139 mEq/L (ref 135–145)

## 2015-03-05 LAB — URINALYSIS, ROUTINE W REFLEX MICROSCOPIC
Bilirubin Urine: NEGATIVE
HGB URINE DIPSTICK: NEGATIVE
Ketones, ur: NEGATIVE
Nitrite: NEGATIVE
Specific Gravity, Urine: 1.01 (ref 1.000–1.030)
Total Protein, Urine: NEGATIVE
UROBILINOGEN UA: 0.2 (ref 0.0–1.0)
Urine Glucose: NEGATIVE
pH: 6.5 (ref 5.0–8.0)

## 2015-03-05 LAB — HEPATIC FUNCTION PANEL
ALK PHOS: 50 U/L (ref 39–117)
ALT: 18 U/L (ref 0–35)
AST: 22 U/L (ref 0–37)
Albumin: 4.5 g/dL (ref 3.5–5.2)
BILIRUBIN DIRECT: 0.1 mg/dL (ref 0.0–0.3)
TOTAL PROTEIN: 7.6 g/dL (ref 6.0–8.3)
Total Bilirubin: 0.3 mg/dL (ref 0.2–1.2)

## 2015-03-05 LAB — HEMOGLOBIN A1C: Hgb A1c MFr Bld: 6.5 % (ref 4.6–6.5)

## 2015-03-05 LAB — CBC WITH DIFFERENTIAL/PLATELET
BASOS ABS: 0 10*3/uL (ref 0.0–0.1)
Basophils Relative: 0.7 % (ref 0.0–3.0)
Eosinophils Absolute: 0.2 10*3/uL (ref 0.0–0.7)
Eosinophils Relative: 3.7 % (ref 0.0–5.0)
HEMATOCRIT: 37.2 % (ref 36.0–46.0)
HEMOGLOBIN: 12.4 g/dL (ref 12.0–15.0)
LYMPHS ABS: 1.3 10*3/uL (ref 0.7–4.0)
Lymphocytes Relative: 24.3 % (ref 12.0–46.0)
MCHC: 33.3 g/dL (ref 30.0–36.0)
MCV: 94.6 fl (ref 78.0–100.0)
MONOS PCT: 11.5 % (ref 3.0–12.0)
Monocytes Absolute: 0.6 10*3/uL (ref 0.1–1.0)
NEUTROS ABS: 3.2 10*3/uL (ref 1.4–7.7)
Neutrophils Relative %: 59.8 % (ref 43.0–77.0)
Platelets: 209 10*3/uL (ref 150.0–400.0)
RBC: 3.93 Mil/uL (ref 3.87–5.11)
RDW: 14.2 % (ref 11.5–15.5)
WBC: 5.4 10*3/uL (ref 4.0–10.5)

## 2015-03-05 LAB — LIPID PANEL
CHOL/HDL RATIO: 2
Cholesterol: 139 mg/dL (ref 0–200)
HDL: 57.7 mg/dL (ref >39.00)
LDL CALC: 61 mg/dL (ref 0–99)
NonHDL: 81.3
Triglycerides: 101 mg/dL (ref 0.0–149.0)
VLDL: 20.2 mg/dL (ref 0.0–40.0)

## 2015-03-05 LAB — IBC PANEL
IRON: 76 ug/dL (ref 42–145)
Saturation Ratios: 23.3 % (ref 20.0–50.0)
Transferrin: 233 mg/dL (ref 212.0–360.0)

## 2015-03-05 LAB — TSH: TSH: 1.02 u[IU]/mL (ref 0.35–4.50)

## 2015-03-05 MED ORDER — DIAZEPAM 5 MG PO TABS: 5.0000 mg | ORAL_TABLET | Freq: Every evening | ORAL | Status: DC | PRN

## 2015-03-05 NOTE — Addendum Note (Signed)
Addended by: Biagio Borg on: 03/05/2015 02:32 PM   Modules accepted: Orders

## 2015-03-05 NOTE — Assessment & Plan Note (Signed)
stable overall by history and exam, recent data reviewed with pt, and pt to continue medical treatment as before,  to f/u any worsening symptoms or concerns Lab Results  Component Value Date   HGBA1C 7.4* 10/28/2014

## 2015-03-05 NOTE — Progress Notes (Signed)
Pre visit review using our clinic review tool, if applicable. No additional management support is needed unless otherwise documented below in the visit note. 

## 2015-03-05 NOTE — Assessment & Plan Note (Signed)
stable overall by history and exam, recent data reviewed with pt, and pt to continue medical treatment as before,  to f/u any worsening symptoms or concerns BP Readings from Last 3 Encounters:  03/05/15 112/64  12/11/14 102/62  11/20/14 122/68

## 2015-03-05 NOTE — Addendum Note (Signed)
Addended by: Biagio Borg on: 03/05/2015 03:02 PM   Modules accepted: Orders

## 2015-03-05 NOTE — Assessment & Plan Note (Signed)
stable overall by history and exam, recent data reviewed with pt, and pt to continue medical treatment as before,  to f/u any worsening symptoms or concerns Lab Results  Component Value Date   LDLCALC 52 10/28/2014

## 2015-03-05 NOTE — Assessment & Plan Note (Signed)
For f/u lab today  Lab Results  Component Value Date   WBC 6.3 11/11/2014   HGB 9.5* 11/11/2014   HCT 30.6* 11/11/2014   MCV 94.2 11/11/2014   PLT 209 11/11/2014

## 2015-03-05 NOTE — Progress Notes (Signed)
Subjective:    Patient ID: Priscilla Anderson, female    DOB: 1934-07-10, 81 y.o.   MRN: [id number]  HPI    Here for yearly f/u;  Overall doing ok;  Pt denies Chest pain, worsening SOB, DOE, wheezing, orthopnea, PND, worsening LE edema, palpitations, dizziness or syncope.  Pt denies neurological change such as new headache, facial or extremity weakness.  Pt denies polydipsia, polyuria, or low sugar symptoms. Pt states overall good compliance with treatment and medications, good tolerability, and has been trying to follow appropriate diet.  Pt denies worsening depressive symptoms, suicidal ideation or panic. No fever, night sweats, wt loss, loss of appetite, or other constitutional symptoms.  Pt states good ability with ADL's, has low fall risk, home safety reviewed and adequate, no other significant changes in hearing or vision, and only occasionally active with exercise. Digoxin stopped by cardiology  Has gained some wt on purpose Wt Readings from Last 3 Encounters:  03/05/15 119 lb 3.2 oz (54.069 kg)  12/11/14 112 lb (50.803 kg)  11/20/14 108 lb (48.988 kg)  Only took one paxil dose b/c "felt bad", feels better and gained wt despite stopping. Denies urinary symptoms such as dysuria, frequency, urgency, flank pain, hematuria or n/v, fever, chills, s/o UTi last visit.  Past Medical History  Diagnosis Date  . Nonischemic cardiomyopathy 09/22/2011  . CHF (congestive heart failure)   . Hyperlipidemia   . Hypertension   . Osteopenia   . Adenomatous colon polyp   . Polyp of larynx   . CAD (coronary artery disease)   . AICD (automatic cardioverter/defibrillator) present   . Type II diabetes mellitus    Past Surgical History  Procedure Laterality Date  . Cardiac defibrillator placement  05/21/2002    s/p re-do pacemaker/defibrillator October 2011- [place].  . Colonoscopy    . Implantable cardioverter defibrillator generator change  09/01/2010    Medtronic  . Biv icd genertaor change out  N/A 07/29/2014    Procedure: BIV ICD GENERTAOR CHANGE OUT;  Surgeon: Sanda Klein, MD;  Location: [facility] CATH LAB;  Service: Cardiovascular;  Laterality: N/A;  . Vaginal hysterectomy    . Breast cyst excision Left 1980  . Cardiac catheterization  07/28/2005    noncritical CAD  . Cardiac catheterization      "I've had several"  . Microlaryngoscopy with co2 laser and excision of vocal cord lesion  08/2002    /notes 03/29/2011    reports that she has never smoked. She has never used smokeless tobacco. She reports that she does not drink alcohol or use illicit drugs. family history includes Arthritis in her father and mother; Diabetes in her other; Heart disease in her brother; Prostate cancer in her brother. No Known Allergies Current Outpatient Prescriptions on File Prior to Visit  Medication Sig Dispense Refill  . Artificial Tear Solution (SOOTHE XP) SOLN Apply 1 drop to eye daily as needed (dry eyes).    . Ascorbic Acid (VITAMIN C) 500 MG tablet Take 500 mg by mouth daily.      Marland Kitchen aspirin 81 MG tablet Take 81 mg by mouth daily.      . Carboxymethylcellul-Glycerin (REFRESH OPTIVE OP) Apply 1 drop to eye at bedtime.    . carvedilol (COREG) 12.5 MG tablet TAKE 1 TABLET TWICE A DAY 180 tablet 2  . diazepam (VALIUM) 5 MG tablet Take 5 mg by mouth at bedtime as needed for anxiety.    . furosemide (LASIX) 40 MG tablet Take 40-60 mg  by mouth daily. Alternating between 40 mg & 60 mg every other day    . glucose blood (ACCU-CHEK AVIVA) test strip Test two times daily. Code 250.02 100 each 11  . ibandronate (BONIVA) 150 MG tablet Take 150 mg by mouth every 30 (thirty) days. Take in the morning with a full glass of water, on an empty stomach, and do not take anything else by mouth or lie down for the next 30 min.    Marland Kitchen JANUVIA 100 MG tablet TAKE 1 TABLET DAILY 90 tablet 2  . Lancets MISC Test as directed two times daily. Code 250.02 100 each 11  . losartan (COZAAR) 50 MG tablet Take 1 tablet (50 mg total) by  mouth daily. 90 tablet 3  . metFORMIN (GLUCOPHAGE-XR) 500 MG 24 hr tablet Take 1 tablet (500 mg total) by mouth daily with breakfast. 180 tablet 3  . potassium chloride SA (K-DUR,KLOR-CON) 20 MEQ tablet TAKE 1 TABLET DAILY 90 tablet 3  . simvastatin (ZOCOR) 20 MG tablet Take 1 tablet (20 mg total) by mouth at bedtime. 90 tablet 3  . spironolactone (ALDACTONE) 25 MG tablet TAKE ONE-HALF TABLET (12.5 MG) DAILY 45 tablet 2  . Wheat Dextrin (BENEFIBER) POWD Take by mouth. 2 tablespoons once daily     No current facility-administered medications on file prior to visit.    Review of Systems Constitutional: Negative for increased diaphoresis, other activity, appetite or siginficant weight change other than noted HENT: Negative for worsening hearing loss, ear pain, facial swelling, mouth sores and neck stiffness.   Eyes: Negative for other worsening pain, redness or visual disturbance.  Respiratory: Negative for shortness of breath and wheezing  Cardiovascular: Negative for chest pain and palpitations.  Gastrointestinal: Negative for diarrhea, blood in stool, abdominal distention or other pain Genitourinary: Negative for hematuria, flank pain or change in urine volume.  Musculoskeletal: Negative for myalgias or other joint complaints.  Skin: Negative for color change and wound or drainage.  Neurological: Negative for syncope and numbness. other than noted Hematological: Negative for adenopathy. or other swelling Psychiatric/Behavioral: Negative for hallucinations, SI, self-injury, decreased concentration or other worsening agitation.      Objective:   Physical Exam BP 112/64 mmHg  Pulse 85  Temp(Src) 98.1 F (36.7 C) (Oral)  Resp 18  Ht 5\' 4"  (1.626 m)  Wt 119 lb 3.2 oz (54.069 kg)  BMI 20.45 kg/m2  SpO2 98% VS noted,  Constitutional: Pt is oriented to person, place, and time. Appears well-developed and well-nourished, in no significant distress Head: Normocephalic and atraumatic.    Right Ear: External ear normal.  Left Ear: External ear normal.  Nose: Nose normal.  Mouth/Throat: Oropharynx is clear and moist.  Eyes: Conjunctivae and EOM are normal. Pupils are equal, round, and reactive to light.  Neck: Normal range of motion. Neck supple. No JVD present. No tracheal deviation present or significant neck LA or mass Cardiovascular: Normal rate, regular rhythm, normal heart sounds and intact distal pulses.   Pulmonary/Chest: Effort normal and breath sounds without rales or wheezing  Abdominal: Soft. Bowel sounds are normal. NT. No HSM  Musculoskeletal: Normal range of motion. Exhibits no edema.  Lymphadenopathy:  Has no cervical adenopathy.  Neurological: Pt is alert and oriented to person, place, and time. Pt has normal reflexes. No cranial nerve deficit. Motor grossly intact Skin: Skin is warm and dry. No rash noted.  Psychiatric:  Has normal mood and affect. Behavior is normal. not depressed affect Lab Results  Component Value Date  WBC 6.3 11/11/2014   HGB 9.5* 11/11/2014   HCT 30.6* 11/11/2014   PLT 209 11/11/2014   GLUCOSE 134* 11/12/2014   CHOL 116 10/28/2014   TRIG 98 10/28/2014   HDL 44 10/28/2014   LDLCALC 52 10/28/2014   ALT 11 11/11/2014   AST 19 11/11/2014   NA 137 11/12/2014   K 3.7 11/12/2014   CL 102 11/12/2014   CREATININE 1.28* 11/12/2014   BUN 23 11/12/2014   CO2 25 11/12/2014   TSH 1.125 10/28/2014   INR 1.02 07/22/2014   HGBA1C 7.4* 10/28/2014   MICROALBUR 0.8 03/04/2014        Assessment & Plan:

## 2015-03-05 NOTE — Patient Instructions (Signed)

## 2015-03-17 DIAGNOSIS — Z1231 Encounter for screening mammogram for malignant neoplasm of breast: Secondary | ICD-10-CM | POA: Diagnosis not present

## 2015-04-01 ENCOUNTER — Encounter: Payer: Self-pay | Admitting: Internal Medicine

## 2015-04-17 ENCOUNTER — Encounter: Payer: Self-pay | Admitting: Gastroenterology

## 2015-05-01 ENCOUNTER — Ambulatory Visit (INDEPENDENT_AMBULATORY_CARE_PROVIDER_SITE_OTHER): Payer: Medicare Other | Admitting: Ophthalmology

## 2015-05-01 ENCOUNTER — Other Ambulatory Visit: Payer: Self-pay | Admitting: Internal Medicine

## 2015-05-01 DIAGNOSIS — E11319 Type 2 diabetes mellitus with unspecified diabetic retinopathy without macular edema: Secondary | ICD-10-CM

## 2015-05-01 DIAGNOSIS — H43813 Vitreous degeneration, bilateral: Secondary | ICD-10-CM

## 2015-05-01 DIAGNOSIS — H2513 Age-related nuclear cataract, bilateral: Secondary | ICD-10-CM

## 2015-05-01 DIAGNOSIS — I10 Essential (primary) hypertension: Secondary | ICD-10-CM

## 2015-05-01 DIAGNOSIS — E11329 Type 2 diabetes mellitus with mild nonproliferative diabetic retinopathy without macular edema: Secondary | ICD-10-CM

## 2015-05-01 DIAGNOSIS — H35033 Hypertensive retinopathy, bilateral: Secondary | ICD-10-CM | POA: Diagnosis not present

## 2015-05-22 ENCOUNTER — Ambulatory Visit (INDEPENDENT_AMBULATORY_CARE_PROVIDER_SITE_OTHER): Payer: Medicare Other | Admitting: Cardiovascular Disease

## 2015-05-22 ENCOUNTER — Encounter: Payer: Self-pay | Admitting: Cardiovascular Disease

## 2015-05-22 VITALS — BP 102/73 | HR 88 | Ht 64.0 in | Wt 123.0 lb

## 2015-05-22 DIAGNOSIS — I429 Cardiomyopathy, unspecified: Secondary | ICD-10-CM

## 2015-05-22 DIAGNOSIS — Z95810 Presence of automatic (implantable) cardiac defibrillator: Secondary | ICD-10-CM

## 2015-05-22 DIAGNOSIS — I5022 Chronic systolic (congestive) heart failure: Secondary | ICD-10-CM | POA: Diagnosis not present

## 2015-05-22 DIAGNOSIS — I420 Dilated cardiomyopathy: Secondary | ICD-10-CM

## 2015-05-22 DIAGNOSIS — I10 Essential (primary) hypertension: Secondary | ICD-10-CM

## 2015-05-22 DIAGNOSIS — I2510 Atherosclerotic heart disease of native coronary artery without angina pectoris: Secondary | ICD-10-CM | POA: Diagnosis not present

## 2015-05-22 LAB — BASIC METABOLIC PANEL
BUN: 37 mg/dL — AB (ref 6–23)
CHLORIDE: 102 meq/L (ref 96–112)
CO2: 28 mEq/L (ref 19–32)
CREATININE: 1.72 mg/dL — AB (ref 0.50–1.10)
Calcium: 9.9 mg/dL (ref 8.4–10.5)
GLUCOSE: 116 mg/dL — AB (ref 70–99)
Potassium: 5.2 mEq/L (ref 3.5–5.3)
Sodium: 141 mEq/L (ref 135–145)

## 2015-05-22 NOTE — Patient Instructions (Signed)
Your physician recommends that you return for lab work in: AT [place].  Remote monitoring is used to monitor your Pacemaker or ICD from home. This monitoring reduces the number of office visits required to check your device to one time per year. It allows [place] to monitor the functioning of your device to ensure it is working properly. You are scheduled for a device check from home on August 23, 2015. You may send your transmission at any time that day. If you have a wireless device, the transmission will be sent automatically. After your physician reviews your transmission, you will receive a postcard with your next transmission date.  Dr. Sallyanne Kuster recommends that you schedule a follow-up appointment in: 6 MONTHS

## 2015-05-22 NOTE — Progress Notes (Signed)
Patient ID: APREL EGELHOFF, female   DOB: 06-21-1934, 81 y.o.   MRN: [id number]     Cardiology Office Note   Date:  05/23/2015   ID:  Aemilia, Dedrick [birth year], MRN [id number]  PCP:  Cathlean Cower, MD  Cardiologist:   Sanda Klein, MD   Chief Complaint  Patient presents with  . 6 MONTHS    Patient feels SOB aome times.      History of Present Illness: PHYLICIA MCGAUGH is a 81 y.o. female who presents for Nonischemic dilated cardiomyopathy, complete heart block, biventricular pacemaker/defibrillator, CAD  Since her last appointment, Mrs. Totino  Has continued to gradually gain weight. She is "feeling like her old self again". Her appetite has improved substantially. She has actually managed to put on several pounds.  She believes she had been too strict with her diabetes diet and feels better after relaxing her caloric and carbohydrate restriction. She has not had any episodes of heart failure. Hgb A1c was 6.4%.   device interrogation shows a steady activity level of roughly 1.3 hours a day. Her CRT-D ( Medtronic Viva XT implanted September 2015) is functioning normally with estimated generator longevity of 5.4 years. There have been no episodes of atrial fibrillation or sustained ventricular tachycardia since device check. The rhythm is consistently atrial sensed biventricular paced (BiV pacing 98% of the time, 0 atrial pacing ).  Lead parameters are acceptable and unchanged since last visit. As before she has nonsustained VT roughly 2 or 3 times every month , maximum 4 seconds in duration. These are asymptomatic.  She has a long-standing history of nonischemic cardiomyopathy. She has complete heart block. Her initial CRT pacemaker was placed in 7017, complicated by late coronary sinus lead dislodgment in 2006. In 2007, it was upgraded to a biventricular ICD (Medtronic Paxtang) with an epicardial left ventricular lead in 2007 (Medtronic [id number]). At that time she had extraction of her left  ventricular lead and the procedure was complicated by deep venous thrombosis of the left subclavian vein as well as hematoma of the pocket which required reexploration.. She has known occlusion of left subclavian vein. 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 She has a Medtronic Sprint Fidelis [phone] dual coil active fixation defibrillator lead that is under advisory. For this reason her old RV pacemaker lead (Medtronic [id number] implanted 2003) was used for pace-sense features, the Sprint Fidelis lead was left in place for tachycardia therapies only. The right atrial lead is the original one placed in 2003 (Medtronic [phone])  She has responded very well to cardiac resynchronization therapy. She has well compensated heart failure (NYHA functional class I) despite a very low left ventricular ejection fraction, most recently estimated to be 25-30% by an echocardiogram in July of this 2014. She has moderate eccentric mitral insufficiency that is felt to be most likely secondary to cardiomyopathy. Coronary angiography in 2006 showed scattered mild to moderate coronary stenoses (40-50% LAD, 30% RCA, 2006). Her nuclear stress test in December of 2013 was interpreted as showing a large area of anterior scar and this has been seen on nuclear stress tests performed as far back as 2004. By scintigraphy left ventricular ejection fraction was only 17%.    Past Medical History  Diagnosis Date  . Nonischemic cardiomyopathy 09/22/2011  . CHF (congestive heart failure)   . Hyperlipidemia   . Hypertension   . Osteopenia   . Adenomatous colon polyp   . Polyp of larynx   . CAD (coronary  artery disease)   . AICD (automatic cardioverter/defibrillator) present   . Type II diabetes mellitus     Past Surgical History  Procedure Laterality Date  . Cardiac defibrillator placement  05/21/2002    s/p re-do pacemaker/defibrillator October 2011- [place].  . Colonoscopy    . Implantable cardioverter  defibrillator generator change  09/01/2010    Medtronic  . Biv icd genertaor change out N/A 07/29/2014    Procedure: BIV ICD GENERTAOR CHANGE OUT;  Surgeon: Sanda Klein, MD;  Location: [facility] CATH LAB;  Service: Cardiovascular;  Laterality: N/A;  . Vaginal hysterectomy    . Breast cyst excision Left 1980  . Cardiac catheterization  07/28/2005    noncritical CAD  . Cardiac catheterization      "I've had several"  . Microlaryngoscopy with co2 laser and excision of vocal cord lesion  08/2002    /notes 03/29/2011     Current Outpatient Prescriptions  Medication Sig Dispense Refill  . Artificial Tear Solution (SOOTHE XP) SOLN Apply 1 drop to eye daily as needed (dry eyes).    . Ascorbic Acid (VITAMIN C) 500 MG tablet Take 500 mg by mouth daily.      Marland Kitchen aspirin 81 MG tablet Take 81 mg by mouth daily.      . Carboxymethylcellul-Glycerin (REFRESH OPTIVE OP) Apply 1 drop to eye at bedtime.    . carvedilol (COREG) 12.5 MG tablet TAKE 1 TABLET TWICE A DAY 180 tablet 2  . diazepam (VALIUM) 5 MG tablet Take 1 tablet (5 mg total) by mouth at bedtime as needed for anxiety. 90 tablet 1  . furosemide (LASIX) 40 MG tablet Take 40-60 mg by mouth daily. Alternating between 40 mg & 60 mg every other day    . glucose blood (ACCU-CHEK AVIVA) test strip Test two times daily. Code 250.02 100 each 11  . ibandronate (BONIVA) 150 MG tablet Take 150 mg by mouth every 30 (thirty) days. Take in the morning with a full glass of water, on an empty stomach, and do not take anything else by mouth or lie down for the next 30 min.    Marland Kitchen JANUVIA 100 MG tablet TAKE 1 TABLET DAILY 90 tablet 1  . Lancets MISC Test as directed two times daily. Code 250.02 100 each 11  . losartan (COZAAR) 50 MG tablet Take 1 tablet (50 mg total) by mouth daily. 90 tablet 3  . metFORMIN (GLUCOPHAGE-XR) 500 MG 24 hr tablet Take 1 tablet (500 mg total) by mouth daily with breakfast. 180 tablet 3  . potassium chloride SA (K-DUR,KLOR-CON) 20 MEQ tablet  TAKE 1 TABLET DAILY 90 tablet 3  . simvastatin (ZOCOR) 20 MG tablet Take 1 tablet (20 mg total) by mouth at bedtime. 90 tablet 3  . spironolactone (ALDACTONE) 25 MG tablet TAKE ONE-HALF TABLET (12.5 MG) DAILY 45 tablet 2  . Wheat Dextrin (BENEFIBER) POWD Take by mouth. 2 tablespoons once daily     No current facility-administered medications for this visit.    Allergies:   Review of patient's allergies indicates no known allergies.    Social History:  The patient  reports that she has never smoked. She has never used smokeless tobacco. She reports that she does not drink alcohol or use illicit drugs.   Family History:  The patient's family history includes Arthritis in her father and mother; Diabetes in her other; Heart disease in her brother; Prostate cancer in her brother.    ROS:  Please see the history of present  illness.    Otherwise, review of systems positive for none.   All other systems are reviewed and negative.    PHYSICAL EXAM: VS:  BP 102/73 mmHg  Pulse 88  Ht 5\' 4"  (1.626 m)  Wt 123 lb (55.792 kg)  BMI 21.10 kg/m2 , BMI Body mass index is 21.1 kg/(m^2). General: Alert, oriented x3, no distress  Head: no evidence of trauma, PERRL, EOMI, no exophtalmos or lid lag, no myxedema, no xanthelasma; normal ears, nose and oropharynx  Neck: normal jugular venous pulsations and no hepatojugular reflux; brisk carotid pulses without delay and no carotid bruits  Chest: clear to auscultation, no signs of consolidation by percussion or palpation, normal fremitus, symmetrical and full respiratory excursions; the left subclavian ICD scar is healthy, the device has slipped into her breast. Collateral venous circulation is seen over the left anterior chest.  Cardiovascular: Inferolaterally displaced and heaving apical impulse, regular rhythm, normal first and paradoxically split second heart sounds, grade 4-7/8 holosystolic apical murmur , No rubs or gallops  Abdomen: no tenderness or  distention, no masses by palpation, no abnormal pulsatility or arterial bruits, normal bowel sounds, no hepatosplenomegaly  Extremities: no clubbing, cyanosis or edema; 2+ radial, ulnar and brachial pulses bilaterally; 2+ right femoral, posterior tibial and dorsalis pedis pulses; 2+ left femoral, posterior tibial and dorsalis pedis pulses; no subclavian or femoral bruits  Neurological: grossly nonfocal  Psych: euthymic mood, full affect   EKG:  EKG is ordered today. The ekg ordered today demonstrates  Atrial sensed, ventricular paced rhythm positive R-wave in lead V1   Recent Labs: 11/11/2014: B Natriuretic Peptide 186.2* 03/05/2015: ALT 18; Hemoglobin 12.4; Platelets 209.0; TSH 1.02 05/22/2015: BUN 37*; Creat 1.72*; Potassium 5.2; Sodium 141    Lipid Panel    Component Value Date/Time   CHOL 139 03/05/2015 1443   TRIG 101.0 03/05/2015 1443   HDL 57.70 03/05/2015 1443   CHOLHDL 2 03/05/2015 1443   VLDL 20.2 03/05/2015 1443   LDLCALC 61 03/05/2015 1443      Wt Readings from Last 3 Encounters:  05/22/15 123 lb (55.792 kg)  03/05/15 119 lb 3.2 oz (54.069 kg)  12/11/14 112 lb (50.803 kg)    ASSESSMENT AND PLAN: Chronic systolic congestive heart failure, NYHA class 1  She has excellent functional status despite severely depressed left ventricular systolic function. She has shown evidence of excellent response to CRT and deterioration when CRT was interrupted secondary to dislodgment years ago. No changes are made to her chronic medical therapy which includes fairly high dose of carvedilol, spironolactone, losartan and a relatively low dose of loop diuretic. She seems to do well on the alternating furosemide doses of 40 mg/60 mg every other day. Her weight has been steadily increasing but this appears to be true weight gain rather than volume retention. She appears to be euvolemic by clinical evaluation and thoracic impedance measurements.  The improvement in her weight and reduction  in nausea and improved appetite were temporarily associated with discontinuation of digoxin therapy. Although there was never evidence of elevated serum digoxin levels, I would not use this medication again. Last potassium level was 5.0 in the high normal range and she takes an ARB, spironolactone and potassium supplements. Will recheck metabolic panel and have a low threshold for discontinuation of the potassium supplement  Biventricular ICD (implantable cardioverter-defibrillator) in place  The left subclavian vein is occluded. She is pacemaker dependent. She has a defibrillator lead with potential risk for coil fracture, but her defibrillator lead  is used exclusively for shock therapy and not for pace/sense features. She has never required defibrillator therapy since device implantation.  Medtronic Consulta implanted October 2011  Right atrial lead Medtronic [id number] implanted 2003  Right ventricular lead ICD sprint Fidelis 6949 implanted 2011  Old RV pacemaker lead Medtronic 5076 implanted 2003 in use as a pace sense lead  Left ventricular lead epicardial Medtronic 5071 implanted 2007  Left subclavian device procedures performed in 2003, 2007, reexploration of pocket for hematoma 2007, 2011  Known occlusion of left subclavian vein by previous sonography.   CORONARY ARTERY DISEASE  She does not have any symptoms of angina pectoris. She has moderate coronary atherosclerotic lesions by remote angiography. Her nuclear stress test in 2013 did not show areas of reversible ischemia. Risk factor modification is mainstay of therapy  DIABETES MELLITUS, TYPE II  Avoid Actos.     Current medicines are reviewed at length with the patient today.  The patient does not have concerns regarding medicines.  The following changes have been made:  no change  Labs/ tests ordered today include:  Orders Placed This Encounter  Procedures  . Basic metabolic panel  . EKG 12-Lead    Patient  Instructions  Your physician recommends that you return for lab work in: AT [place].  Remote monitoring is used to monitor your Pacemaker or ICD from home. This monitoring reduces the number of office visits required to check your device to one time per year. It allows [place] to monitor the functioning of your device to ensure it is working properly. You are scheduled for a device check from home on August 23, 2015. You may send your transmission at any time that day. If you have a wireless device, the transmission will be sent automatically. After your physician reviews your transmission, you will receive a postcard with your next transmission date.  Dr. Sallyanne Kuster recommends that you schedule a follow-up appointment in: [place], Kimora Stankovic, MD  05/23/2015 8:24 AM    Sanda Klein, MD, [facility] [phone] office [phone] pager

## 2015-05-23 ENCOUNTER — Other Ambulatory Visit: Payer: Self-pay | Admitting: Cardiovascular Disease

## 2015-05-25 ENCOUNTER — Telehealth: Payer: Self-pay | Admitting: *Deleted

## 2015-05-25 DIAGNOSIS — Z79899 Other long term (current) drug therapy: Secondary | ICD-10-CM

## 2015-05-25 NOTE — Telephone Encounter (Signed)
Patient notified of lab results and instructed to stop the K+ supplement and get lab work rechecked in 6 weeks.  Patient voiced understanding.  Lab order mailed to patient.

## 2015-05-25 NOTE — Telephone Encounter (Signed)
-----   Message from Sanda Klein, MD sent at 05/22/2015  9:08 PM EDT ----- Potassium remains high normal range. Please stop KCl supplement. Repeat in 6 weeks

## 2015-05-25 NOTE — Telephone Encounter (Signed)
Rx(s) sent to pharmacy electronically.  

## 2015-06-01 LAB — CUP PACEART INCLINIC DEVICE CHECK
Battery Voltage: 2.97 V
Brady Statistic AP VS Percent: 0.01 %
Brady Statistic AS VP Percent: 97.47 %
Brady Statistic RV Percent Paced: 98.25 %
Date Time Interrogation Session: 20160708145708
HIGH POWER IMPEDANCE MEASURED VALUE: 38 Ohm
HighPow Impedance: 55 Ohm
Lead Channel Impedance Value: 342 Ohm
Lead Channel Impedance Value: 399 Ohm
Lead Channel Impedance Value: 4047 Ohm
Lead Channel Impedance Value: 456 Ohm
Lead Channel Pacing Threshold Amplitude: 1.375 V
Lead Channel Pacing Threshold Amplitude: 1.5 V
Lead Channel Pacing Threshold Pulse Width: 0.4 ms
Lead Channel Sensing Intrinsic Amplitude: 14.375 mV
Lead Channel Sensing Intrinsic Amplitude: 14.375 mV
Lead Channel Sensing Intrinsic Amplitude: 3.875 mV
Lead Channel Setting Pacing Amplitude: 2 V
Lead Channel Setting Pacing Amplitude: 2.5 V
Lead Channel Setting Pacing Amplitude: 3 V
Lead Channel Setting Pacing Pulse Width: 0.4 ms
Lead Channel Setting Sensing Sensitivity: 0.3 mV
MDC IDC MSMT BATTERY REMAINING LONGEVITY: 65 mo
MDC IDC MSMT LEADCHNL LV IMPEDANCE VALUE: 304 Ohm
MDC IDC MSMT LEADCHNL LV IMPEDANCE VALUE: 4047 Ohm
MDC IDC MSMT LEADCHNL LV PACING THRESHOLD PULSEWIDTH: 0.4 ms
MDC IDC MSMT LEADCHNL RA PACING THRESHOLD AMPLITUDE: 0.625 V
MDC IDC MSMT LEADCHNL RA SENSING INTR AMPL: 3.875 mV
MDC IDC MSMT LEADCHNL RV PACING THRESHOLD PULSEWIDTH: 0.4 ms
MDC IDC SET LEADCHNL RV PACING PULSEWIDTH: 0.4 ms
MDC IDC SET ZONE DETECTION INTERVAL: 300 ms
MDC IDC STAT BRADY AP VP PERCENT: 0.79 %
MDC IDC STAT BRADY AS VS PERCENT: 1.73 %
MDC IDC STAT BRADY RA PERCENT PACED: 0.8 %
Zone Setting Detection Interval: 350 ms
Zone Setting Detection Interval: 370 ms
Zone Setting Detection Interval: 450 ms

## 2015-06-05 IMAGING — DX DG CHEST 2V
2 series · 2 of 2 positions shown · non-contrast
Comparison: None.

CLINICAL DATA: Acute left-sided chest pain.

EXAM:
CHEST  2 VIEW

[chest pa]
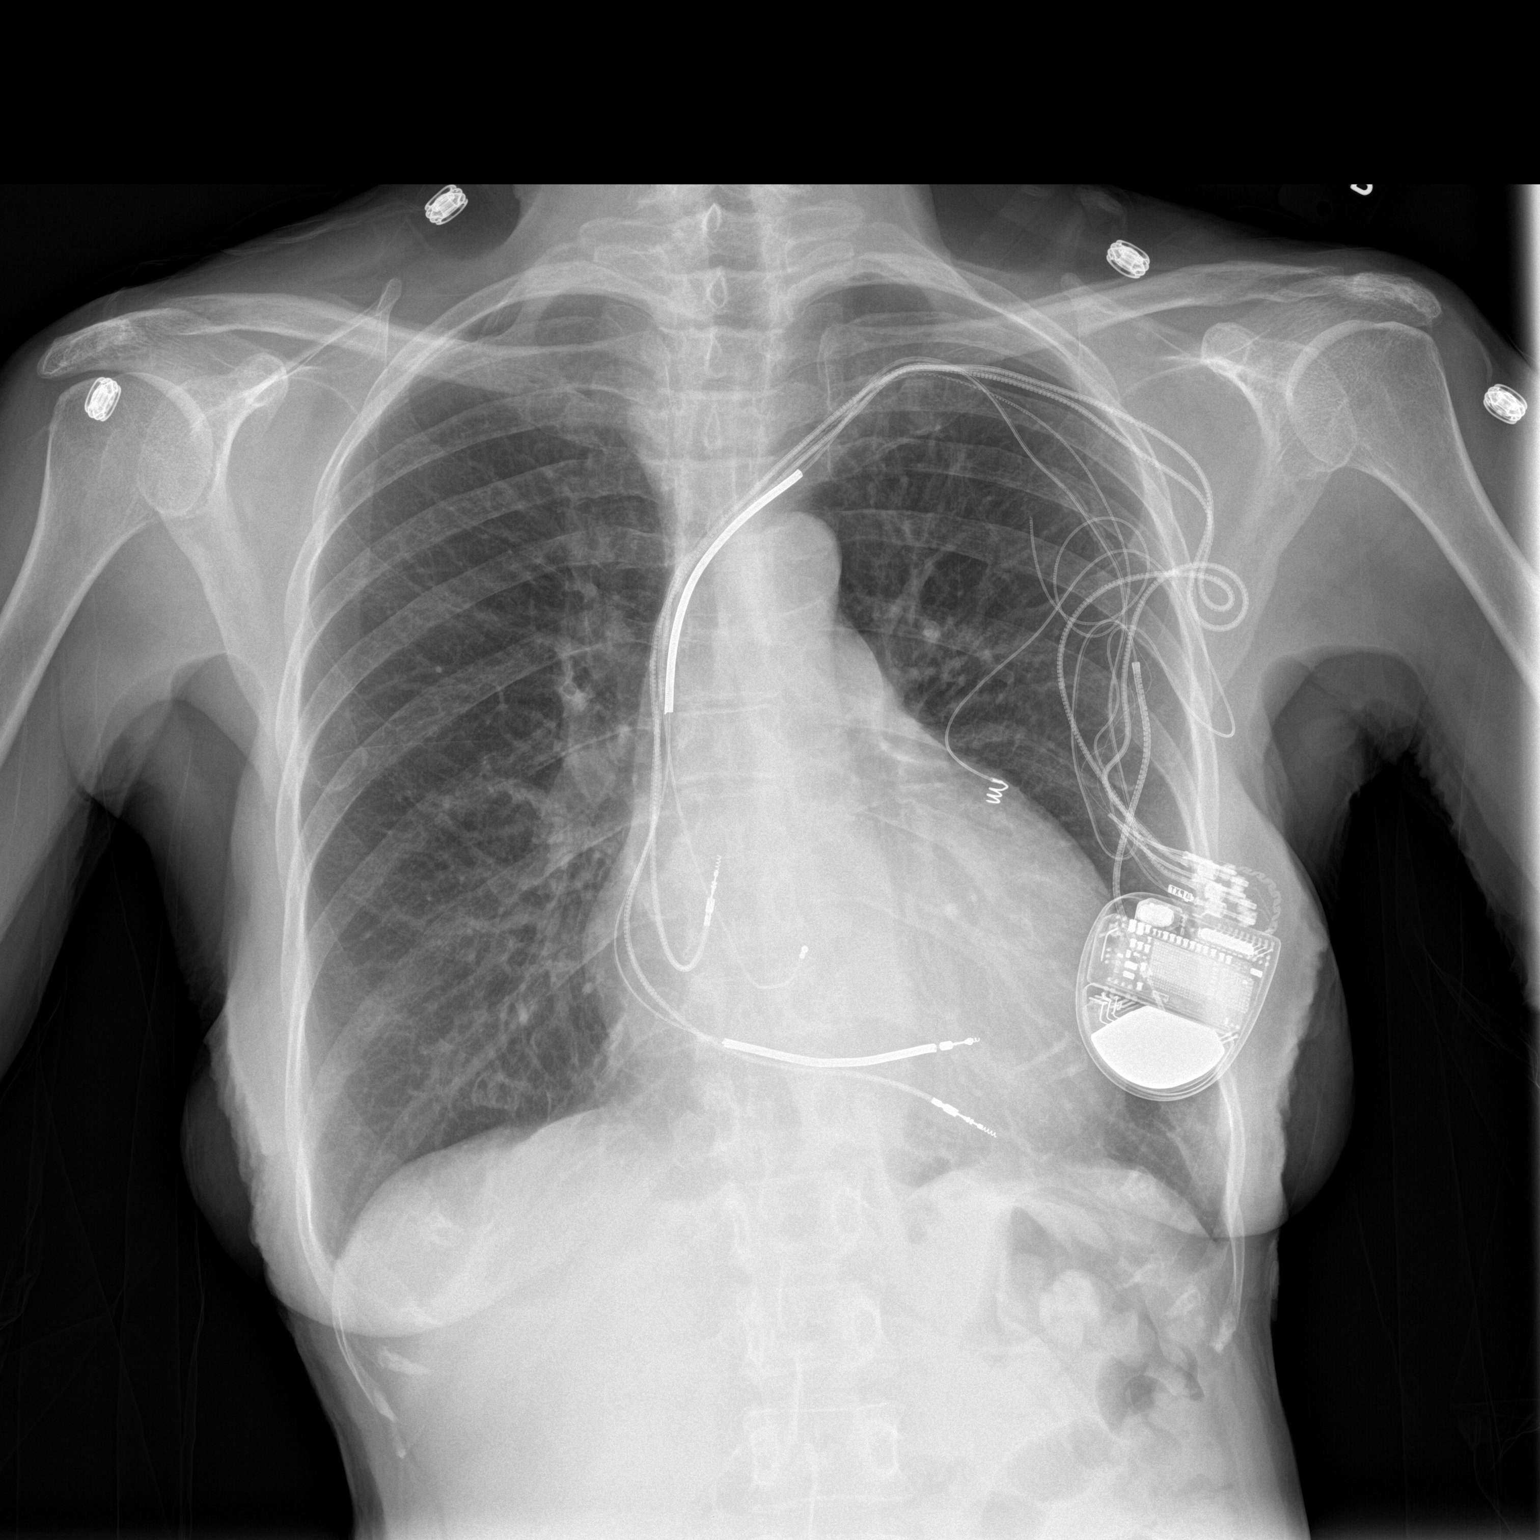

[chest lat]
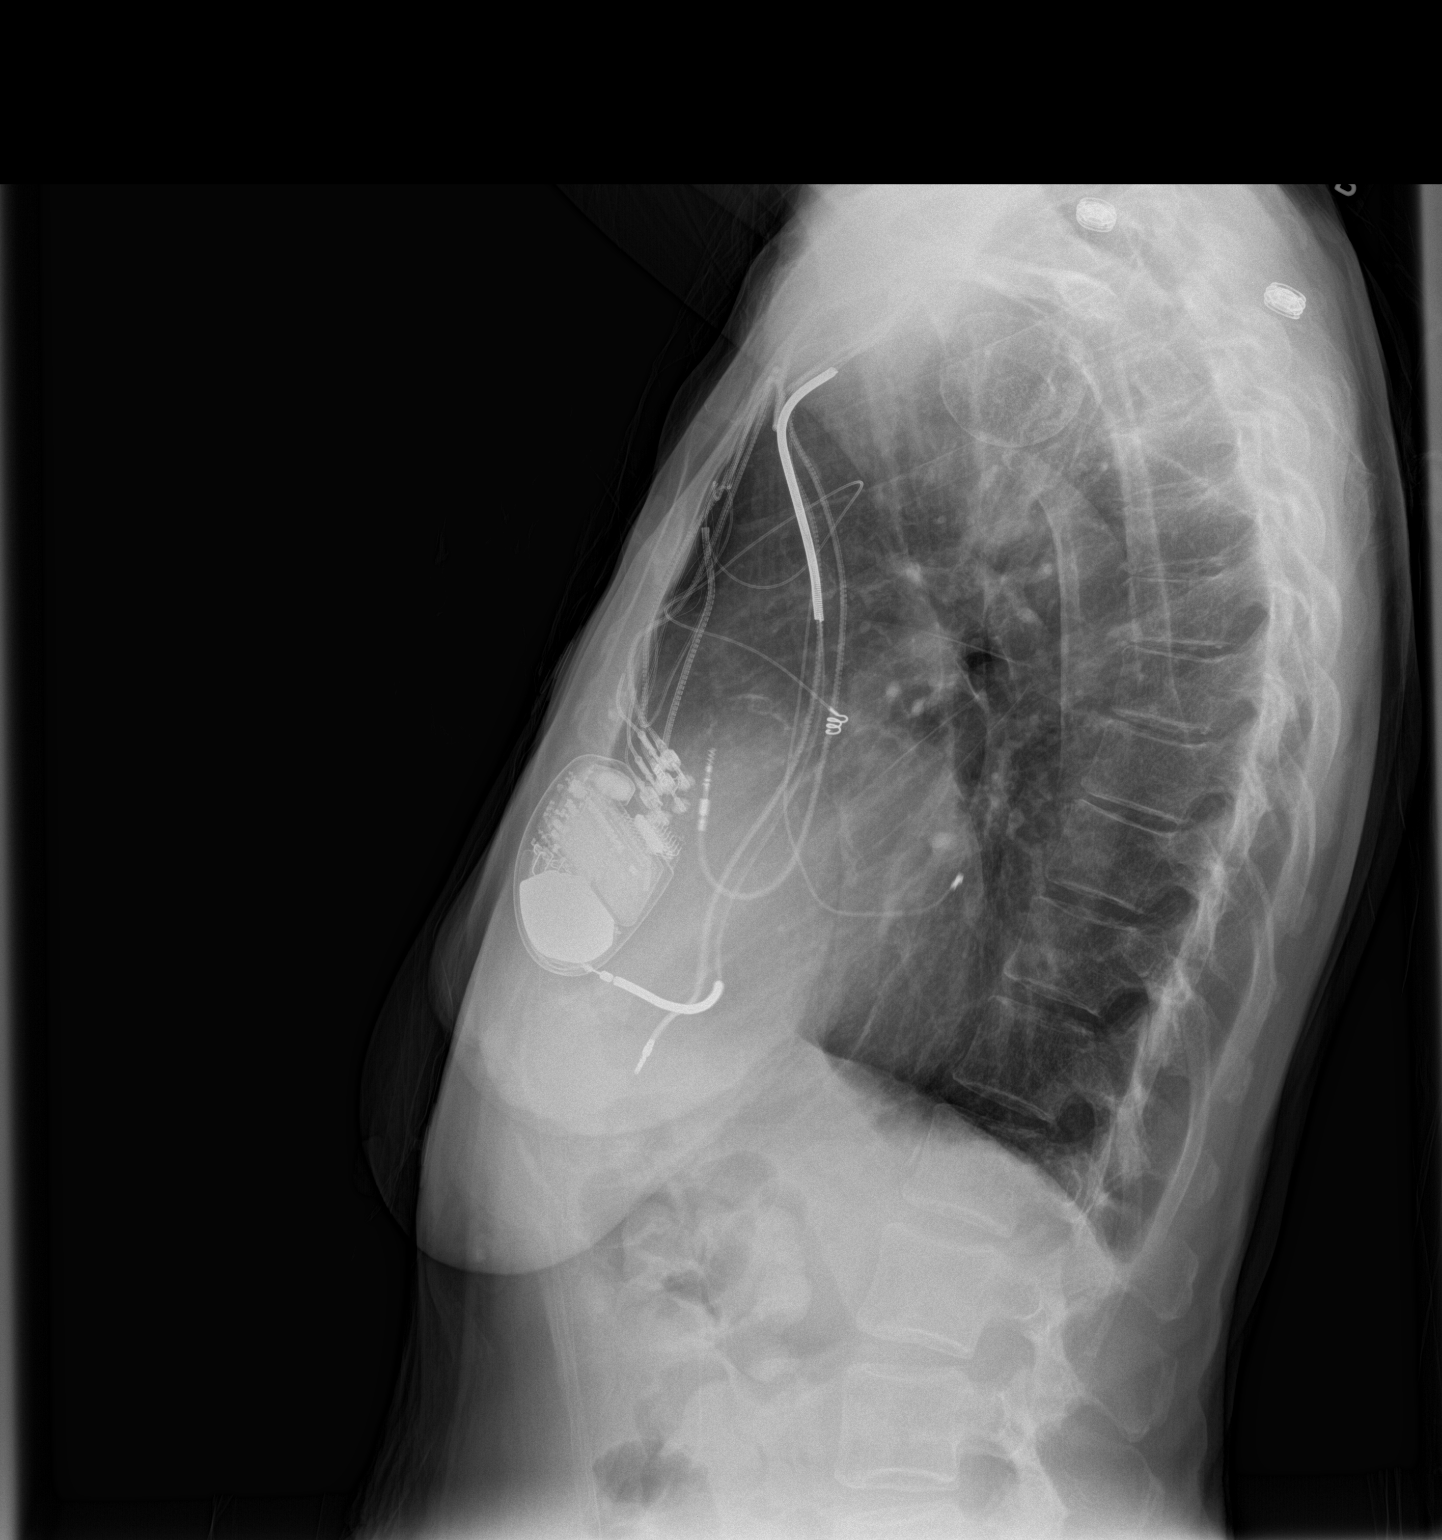

[2 of 2 positions shown; findings below may reference images not displayed]

FINDINGS: The heart size and mediastinal contours are within normal limits. No
pneumothorax or pleural effusion is noted. Left-sided pacemaker is
noted. Both lungs are clear. The visualized skeletal structures are
unremarkable.
IMPRESSION: No acute cardiopulmonary abnormality seen.

## 2015-07-06 DIAGNOSIS — Z79899 Other long term (current) drug therapy: Secondary | ICD-10-CM | POA: Diagnosis not present

## 2015-07-07 LAB — BASIC METABOLIC PANEL
BUN: 24 mg/dL (ref 7–25)
CHLORIDE: 103 mmol/L (ref 98–110)
CO2: 27 mmol/L (ref 20–31)
CREATININE: 1.6 mg/dL — AB (ref 0.60–0.93)
Calcium: 9.4 mg/dL (ref 8.6–10.4)
Glucose, Bld: 174 mg/dL — ABNORMAL HIGH (ref 65–99)
POTASSIUM: 5 mmol/L (ref 3.5–5.3)
Sodium: 139 mmol/L (ref 135–146)

## 2015-07-08 ENCOUNTER — Ambulatory Visit (INDEPENDENT_AMBULATORY_CARE_PROVIDER_SITE_OTHER): Payer: Medicare Other | Admitting: Internal Medicine

## 2015-07-08 ENCOUNTER — Encounter: Payer: Self-pay | Admitting: Internal Medicine

## 2015-07-08 VITALS — BP 112/70 | HR 97 | Temp 97.7°F | Ht 64.5 in | Wt 123.0 lb

## 2015-07-08 DIAGNOSIS — I2510 Atherosclerotic heart disease of native coronary artery without angina pectoris: Secondary | ICD-10-CM

## 2015-07-08 DIAGNOSIS — I10 Essential (primary) hypertension: Secondary | ICD-10-CM

## 2015-07-08 DIAGNOSIS — E1129 Type 2 diabetes mellitus with other diabetic kidney complication: Secondary | ICD-10-CM

## 2015-07-08 DIAGNOSIS — R079 Chest pain, unspecified: Secondary | ICD-10-CM | POA: Diagnosis not present

## 2015-07-08 DIAGNOSIS — R0600 Dyspnea, unspecified: Secondary | ICD-10-CM

## 2015-07-08 MED ORDER — ALBUTEROL SULFATE HFA 108 (90 BASE) MCG/ACT IN AERS: 2.0000 | INHALATION_SPRAY | Freq: Four times a day (QID) | RESPIRATORY_TRACT | Status: DC | PRN

## 2015-07-08 NOTE — Progress Notes (Signed)
Pre visit review using our clinic review tool, if applicable. No additional management support is needed unless otherwise documented below in the visit note. 

## 2015-07-08 NOTE — Progress Notes (Signed)
Subjective:    Patient ID: Priscilla Anderson, female    DOB: 02-05-1934, 81 y.o.   MRN: [id number]  HPI  Here to f/u; Pt denies chest pain, wheezing,orthopnea, PND, increased LE swelling, palpitations, dizziness or syncope. But c/o somewhat vague symptoms of sob/doe gradually worse x 1 month, feels some tightness and harder to take breaths.   No hx of asthma, copd.  No cough, fever or recent wt loss, in fact has gained several lbs. Pt denies new neurological symptoms such as new headache, or facial or extremity weakness or numbness.  Pt denies polydipsia, polyuria, or low sugar episode.   Pt denies new neurological symptoms such as new headache, or facial or extremity weakness or numbness.   Pt states overall good compliance with meds, mostly trying to follow appropriate diet, with wt overall up several lbs.  Has seen Dr C/card and potassium stopped recently with normal K f/u lab 2 days ago.  Has stable CKD by labs. Hospd last dec 2015 for atypical CP at rest, not felt to need further eval at that time.  Last stress test > 3 yrs per pt with Dr Alphonsa Overall, and last echo July 2014 Wt Readings from Last 3 Encounters:  07/08/15 123 lb (55.792 kg)  05/22/15 123 lb (55.792 kg)  03/05/15 119 lb 3.2 oz (54.069 kg)  CBBG may be somewhat increased in the afternoons with recent wt gain. To the 150's Past Medical History  Diagnosis Date  . Nonischemic cardiomyopathy 09/22/2011  . CHF (congestive heart failure)   . Hyperlipidemia   . Hypertension   . Osteopenia   . Adenomatous colon polyp   . Polyp of larynx   . CAD (coronary artery disease)   . AICD (automatic cardioverter/defibrillator) present   . Type II diabetes mellitus    Past Surgical History  Procedure Laterality Date  . Cardiac defibrillator placement  05/21/2002    s/p re-do pacemaker/defibrillator October 2011- [facility].  . Colonoscopy    . Implantable cardioverter defibrillator generator change  09/01/2010    Medtronic  . Biv icd  genertaor change out N/A 07/29/2014    Procedure: BIV ICD GENERTAOR CHANGE OUT;  Surgeon: Sanda Klein, MD;  Location: [facility] CATH LAB;  Service: Cardiovascular;  Laterality: N/A;  . Vaginal hysterectomy    . Breast cyst excision Left 1980  . Cardiac catheterization  07/28/2005    noncritical CAD  . Cardiac catheterization      "I've had several"  . Microlaryngoscopy with co2 laser and excision of vocal cord lesion  08/2002    /notes 03/29/2011    reports that she has never smoked. She has never used smokeless tobacco. She reports that she does not drink alcohol or use illicit drugs. family history includes Arthritis in her father and mother; Diabetes in her other; Heart disease in her brother; Prostate cancer in her brother. No Known Allergies    Review of Systems  Constitutional: Negative for unusual diaphoresis or night sweats HENT: Negative for ringing in ear or discharge Eyes: Negative for double vision or worsening visual disturbance.  Respiratory: Negative for choking and stridor.   Gastrointestinal: Negative for vomiting or other signifcant bowel change Genitourinary: Negative for hematuria or change in urine volume.  Musculoskeletal: Negative for other MSK pain or swelling Skin: Negative for color change and worsening wound.  Neurological: Negative for tremors and numbness other than noted  Psychiatric/Behavioral: Negative for decreased concentration or agitation other than above  Objective:   Physical Exam BP 112/70 mmHg  Pulse 97  Temp(Src) 97.7 F (36.5 C) (Oral)  Ht 5' 4.5" (1.638 m)  Wt 123 lb (55.792 kg)  BMI 20.79 kg/m2  SpO2 98% VS noted,  Constitutional: Pt appears in no significant distress HENT: Head: NCAT.  Right Ear: External ear normal.  Left Ear: External ear normal.  Eyes: . Pupils are equal, round, and reactive to light. Conjunctivae and EOM are normal Neck: Normal range of motion. Neck supple.  Cardiovascular: Normal rate and regular rhythm.     Pulmonary/Chest: Effort normal and breath sounds decreased without rales or wheezing.  Abd:  Soft, NT, ND, + BS Neurological: Pt is alert. Not confused , motor grossly intact Skin: Skin is warm. No rash, no LE edema Psychiatric: Pt behavior is normal. No agitation.     Assessment & Plan:

## 2015-07-08 NOTE — Patient Instructions (Addendum)
Please take all new medication as prescribed  - the inhaler to try  Please continue all other medications as before, and refills have been done if requested.  Please have the pharmacy call with any other refills you may need.  Please continue your efforts at being more active, low cholesterol diet, and weight control.  You are otherwise up to date with prevention measures today.  You will be contacted regarding the referral for: Echocardiogram, and stress test  Please keep your appointments with your specialists as you may have planned  Please go to the XRAY Department in the Basement (go straight as you get off the elevator) for the x-ray testing  You will be contacted by phone if any changes need to be made immediately.  Otherwise, you will receive a letter about your results with an explanation, but please check with MyChart first.  Please remember to sign up for MyChart if you have not done so, as this will be important to you in the future with finding out test results, communicating by private email, and scheduling acute appointments online when needed.  Please return in 6 months, or sooner if needed  OK to CANCEL the Oct 21 appt

## 2015-07-09 ENCOUNTER — Other Ambulatory Visit: Payer: Self-pay | Admitting: Internal Medicine

## 2015-07-09 ENCOUNTER — Telehealth: Payer: Self-pay

## 2015-07-09 ENCOUNTER — Ambulatory Visit (INDEPENDENT_AMBULATORY_CARE_PROVIDER_SITE_OTHER)
Admission: RE | Admit: 2015-07-09 | Discharge: 2015-07-09 | Disposition: A | Discharge status: Home / Self Care | Payer: Medicare Other | Source: Ambulatory Visit | Attending: Internal Medicine | Admitting: Internal Medicine

## 2015-07-09 ENCOUNTER — Encounter: Payer: Self-pay | Admitting: Internal Medicine

## 2015-07-09 ENCOUNTER — Other Ambulatory Visit (INDEPENDENT_AMBULATORY_CARE_PROVIDER_SITE_OTHER): Payer: Medicare Other

## 2015-07-09 DIAGNOSIS — R0600 Dyspnea, unspecified: Secondary | ICD-10-CM

## 2015-07-09 DIAGNOSIS — J90 Pleural effusion, not elsewhere classified: Secondary | ICD-10-CM | POA: Diagnosis not present

## 2015-07-09 DIAGNOSIS — I517 Cardiomegaly: Secondary | ICD-10-CM | POA: Diagnosis not present

## 2015-07-09 DIAGNOSIS — J811 Chronic pulmonary edema: Secondary | ICD-10-CM | POA: Diagnosis not present

## 2015-07-09 LAB — TSH: TSH: 1.52 u[IU]/mL (ref 0.35–4.50)

## 2015-07-09 LAB — CBC WITH DIFFERENTIAL/PLATELET
BASOS ABS: 0.1 10*3/uL (ref 0.0–0.1)
Basophils Relative: 0.9 % (ref 0.0–3.0)
EOS PCT: 2.5 % (ref 0.0–5.0)
Eosinophils Absolute: 0.2 10*3/uL (ref 0.0–0.7)
HCT: 36.1 % (ref 36.0–46.0)
HEMOGLOBIN: 12.1 g/dL (ref 12.0–15.0)
Lymphocytes Relative: 16.2 % (ref 12.0–46.0)
Lymphs Abs: 1.1 10*3/uL (ref 0.7–4.0)
MCHC: 33.5 g/dL (ref 30.0–36.0)
MCV: 96.8 fl (ref 78.0–100.0)
Monocytes Absolute: 0.7 10*3/uL (ref 0.1–1.0)
Monocytes Relative: 9.8 % (ref 3.0–12.0)
NEUTROS PCT: 70.6 % (ref 43.0–77.0)
Neutro Abs: 4.9 10*3/uL (ref 1.4–7.7)
Platelets: 195 10*3/uL (ref 150.0–400.0)
RBC: 3.72 Mil/uL — ABNORMAL LOW (ref 3.87–5.11)
RDW: 14.7 % (ref 11.5–15.5)
WBC: 6.9 10*3/uL (ref 4.0–10.5)

## 2015-07-09 LAB — BASIC METABOLIC PANEL
BUN: 30 mg/dL — ABNORMAL HIGH (ref 6–23)
CALCIUM: 9.6 mg/dL (ref 8.4–10.5)
CO2: 24 mEq/L (ref 19–32)
Chloride: 103 mEq/L (ref 96–112)
Creatinine, Ser: 1.65 mg/dL — ABNORMAL HIGH (ref 0.40–1.20)
GFR: 38.55 mL/min — AB (ref >60.00)
GLUCOSE: 166 mg/dL — AB (ref 70–99)
POTASSIUM: 4.1 meq/L (ref 3.5–5.1)
SODIUM: 139 meq/L (ref 135–145)

## 2015-07-09 LAB — HEPATIC FUNCTION PANEL
ALBUMIN: 4.1 g/dL (ref 3.5–5.2)
ALT: 39 U/L — AB (ref 0–35)
AST: 25 U/L (ref 0–37)
Alkaline Phosphatase: 52 U/L (ref 39–117)
Bilirubin, Direct: 0.1 mg/dL (ref 0.0–0.3)
TOTAL PROTEIN: 6.8 g/dL (ref 6.0–8.3)
Total Bilirubin: 0.5 mg/dL (ref 0.2–1.2)

## 2015-07-09 MED ORDER — SPIRONOLACTONE 25 MG PO TABS: 25.0000 mg | ORAL_TABLET | Freq: Every day | ORAL | Status: DC

## 2015-07-09 NOTE — Addendum Note (Signed)
Addended by: Lyman Bishop on: 07/09/2015 04:19 PM   Modules accepted: Orders

## 2015-07-09 NOTE — Assessment & Plan Note (Signed)
stable overall by history and exam, recent data reviewed with pt, and pt to continue medical treatment as before,  to f/u any worsening symptoms or concerns Lab Results  Component Value Date   HGBA1C 6.5 03/05/2015   For f/u lab

## 2015-07-09 NOTE — Assessment & Plan Note (Signed)
stable overall by history and exam, recent data reviewed with pt, and pt to continue medical treatment as before,  to f/u any worsening symptoms or concerns  BP Readings from Last 3 Encounters:  07/08/15 112/70  05/22/15 102/73  03/05/15 112/64

## 2015-07-09 NOTE — Telephone Encounter (Signed)
.  A user error has taken place: error

## 2015-07-09 NOTE — Assessment & Plan Note (Addendum)
Etiology unclear, exam benign, but cant r/o early CHF  - for cxr, bmp, consider increased aldactone pending BMP  Note:  Total time for pt hx, exam, review of record with pt in the room, determination of diagnoses and plan for further eval and tx is > 40 min, with over 50% spent in coordination and counseling of patient

## 2015-07-15 ENCOUNTER — Telehealth (HOSPITAL_COMMUNITY): Payer: Self-pay | Admitting: *Deleted

## 2015-07-15 NOTE — Telephone Encounter (Signed)
Patient given detailed instructions per Myocardial Perfusion Study Information Sheet for test on 07/17/15 at 0945. Patient Notified to arrive 15 minutes early, and that it is imperative to arrive on time for appointment to keep from having the test rescheduled. Patient verbalized understanding. Keltin Baird, [place]

## 2015-07-15 NOTE — Telephone Encounter (Signed)
Left message on voicemail in reference to upcoming appointment scheduled for 07/17/15. Phone number given for a call back so details instructions can be given. Emry Barbato, [place]

## 2015-07-17 ENCOUNTER — Ambulatory Visit (HOSPITAL_COMMUNITY): Payer: Medicare Other | Attending: Internal Medicine

## 2015-07-17 ENCOUNTER — Encounter (HOSPITAL_COMMUNITY): Payer: Self-pay

## 2015-07-17 ENCOUNTER — Other Ambulatory Visit: Payer: Self-pay

## 2015-07-17 ENCOUNTER — Ambulatory Visit (HOSPITAL_BASED_OUTPATIENT_CLINIC_OR_DEPARTMENT_OTHER): Payer: Medicare Other

## 2015-07-17 DIAGNOSIS — R0609 Other forms of dyspnea: Secondary | ICD-10-CM | POA: Diagnosis not present

## 2015-07-17 DIAGNOSIS — R5383 Other fatigue: Secondary | ICD-10-CM | POA: Diagnosis not present

## 2015-07-17 DIAGNOSIS — R079 Chest pain, unspecified: Secondary | ICD-10-CM | POA: Diagnosis not present

## 2015-07-17 DIAGNOSIS — I10 Essential (primary) hypertension: Secondary | ICD-10-CM | POA: Diagnosis not present

## 2015-07-17 DIAGNOSIS — R0600 Dyspnea, unspecified: Secondary | ICD-10-CM

## 2015-07-17 DIAGNOSIS — E785 Hyperlipidemia, unspecified: Secondary | ICD-10-CM | POA: Insufficient documentation

## 2015-07-17 DIAGNOSIS — Z95810 Presence of automatic (implantable) cardiac defibrillator: Secondary | ICD-10-CM | POA: Insufficient documentation

## 2015-07-17 DIAGNOSIS — E119 Type 2 diabetes mellitus without complications: Secondary | ICD-10-CM | POA: Diagnosis not present

## 2015-07-17 DIAGNOSIS — R0602 Shortness of breath: Secondary | ICD-10-CM | POA: Diagnosis not present

## 2015-07-17 DIAGNOSIS — I517 Cardiomegaly: Secondary | ICD-10-CM | POA: Insufficient documentation

## 2015-07-17 DIAGNOSIS — I2510 Atherosclerotic heart disease of native coronary artery without angina pectoris: Secondary | ICD-10-CM | POA: Diagnosis not present

## 2015-07-17 DIAGNOSIS — R9439 Abnormal result of other cardiovascular function study: Secondary | ICD-10-CM | POA: Insufficient documentation

## 2015-07-17 DIAGNOSIS — I340 Nonrheumatic mitral (valve) insufficiency: Secondary | ICD-10-CM | POA: Diagnosis not present

## 2015-07-17 DIAGNOSIS — F17200 Nicotine dependence, unspecified, uncomplicated: Secondary | ICD-10-CM | POA: Insufficient documentation

## 2015-07-17 LAB — MYOCARDIAL PERFUSION IMAGING
CHL CUP NUCLEAR SRS: 13
CHL CUP NUCLEAR SSS: 20
CSEPPHR: 103 {beats}/min
LHR: 0.26
LVDIAVOL: 370 mL
LVSYSVOL: 311 mL
NUC STRESS TID: 1.04
Percent HR: 73 %
Rest HR: 84 {beats}/min
SDS: 7

## 2015-07-17 MED ORDER — REGADENOSON 0.4 MG/5ML IV SOLN
0.4000 mg | Freq: Once | INTRAVENOUS | Status: AC
  Administered 2015-07-17: 0.4 mg via INTRAVENOUS

## 2015-07-17 MED ORDER — TECHNETIUM TC 99M SESTAMIBI GENERIC - CARDIOLITE
30.4000 | Freq: Once | INTRAVENOUS | Status: AC | PRN
  Administered 2015-07-17: 30 via INTRAVENOUS

## 2015-07-17 MED ORDER — TECHNETIUM TC 99M SESTAMIBI GENERIC - CARDIOLITE
9.6000 | Freq: Once | INTRAVENOUS | Status: AC | PRN
  Administered 2015-07-17: 10 via INTRAVENOUS

## 2015-07-21 ENCOUNTER — Encounter: Payer: Self-pay | Admitting: Internal Medicine

## 2015-07-22 ENCOUNTER — Telehealth: Payer: Self-pay | Admitting: Internal Medicine

## 2015-07-22 NOTE — Telephone Encounter (Signed)
Patient returned your call. She states she will be at bible study until 1pm this afternoon and requests a call back after that time. CB# [phone]

## 2015-07-22 NOTE — Telephone Encounter (Signed)
noted 

## 2015-08-03 ENCOUNTER — Other Ambulatory Visit: Payer: Self-pay | Admitting: Cardiovascular Disease

## 2015-08-03 ENCOUNTER — Telehealth: Payer: Self-pay

## 2015-08-03 ENCOUNTER — Telehealth: Payer: Self-pay | Admitting: Cardiovascular Disease

## 2015-08-03 NOTE — Telephone Encounter (Signed)
Patient wondering why she wasn't seen earlier because of test results.  Explained since her PCP ordered all test the results came to him and he would have made the decision of the urgency to see the cardiologist.  Patient has an appointment tomorrow morning with Kerin Ransom

## 2015-08-03 NOTE — Telephone Encounter (Signed)
Please call,pt have been SOB and other concerns.Think patient should have been seen sooner.

## 2015-08-03 NOTE — Telephone Encounter (Signed)
i received a call from patients granddaughter, rachel who was concerned about why grandmothers cardiology appt was 2 weeks out from patients office visit here with dr Jenny Reichmann after her echo resulted---i explained that dr Jenny Reichmann did reach out on my chart to patient, patient's my chart did show to be active, and also that dahlia called to inquire about how shortness of breath was feeling now and if there was more urgency to patient having care sooner---from notes i could find on patients chart, it appears patient states she was feeling some better and that perhaps the urgency to see cardiology before the 2 week appt already arranged was not necessary at this time---granddaughter stated that patient does not have internet access and did not understand anything about my chart---i advised rachel we would change my chart status here in the office so that dr Jenny Reichmann would not think patient was able to communicate over internet---and that i could call cardiology for rachel/and patient if patient is needing appt sooner---rachel stated her appt is tomorrow and that she was just now finding all this out this past Saturday, so rachel was just checking to see where confusion started---i advised rachel to let patient know that she needs to call our office anytime she is not feeling well---and that if patient thinks she needs to be seen sooner than any appt that has been scheduled, to let our office know---we have personnel to help with urgent referrals whenever they are needed--rachel to let patient know---i have changed mychart status to inactive---patient will need to be called directly from this point forward

## 2015-08-03 NOTE — Telephone Encounter (Signed)
REFILL 

## 2015-08-04 ENCOUNTER — Ambulatory Visit (INDEPENDENT_AMBULATORY_CARE_PROVIDER_SITE_OTHER): Payer: Medicare Other | Admitting: *Deleted

## 2015-08-04 ENCOUNTER — Encounter: Payer: Self-pay | Admitting: Cardiology

## 2015-08-04 ENCOUNTER — Ambulatory Visit (INDEPENDENT_AMBULATORY_CARE_PROVIDER_SITE_OTHER): Payer: Medicare Other | Admitting: Cardiology

## 2015-08-04 VITALS — BP 114/80 | HR 94 | Ht 64.5 in | Wt 122.2 lb

## 2015-08-04 DIAGNOSIS — E1129 Type 2 diabetes mellitus with other diabetic kidney complication: Secondary | ICD-10-CM

## 2015-08-04 DIAGNOSIS — R0600 Dyspnea, unspecified: Secondary | ICD-10-CM | POA: Diagnosis not present

## 2015-08-04 DIAGNOSIS — N189 Chronic kidney disease, unspecified: Secondary | ICD-10-CM

## 2015-08-04 DIAGNOSIS — I2510 Atherosclerotic heart disease of native coronary artery without angina pectoris: Secondary | ICD-10-CM

## 2015-08-04 DIAGNOSIS — Z95810 Presence of automatic (implantable) cardiac defibrillator: Secondary | ICD-10-CM

## 2015-08-04 DIAGNOSIS — I5022 Chronic systolic (congestive) heart failure: Secondary | ICD-10-CM

## 2015-08-04 DIAGNOSIS — I420 Dilated cardiomyopathy: Secondary | ICD-10-CM

## 2015-08-04 DIAGNOSIS — I509 Heart failure, unspecified: Secondary | ICD-10-CM | POA: Diagnosis not present

## 2015-08-04 DIAGNOSIS — I429 Cardiomyopathy, unspecified: Secondary | ICD-10-CM | POA: Diagnosis not present

## 2015-08-04 DIAGNOSIS — I10 Essential (primary) hypertension: Secondary | ICD-10-CM | POA: Diagnosis not present

## 2015-08-04 DIAGNOSIS — N183 Chronic kidney disease, stage 3 unspecified: Secondary | ICD-10-CM

## 2015-08-04 DIAGNOSIS — N2889 Other specified disorders of kidney and ureter: Secondary | ICD-10-CM

## 2015-08-04 LAB — CUP PACEART INCLINIC DEVICE CHECK
Battery Remaining Longevity: 73 mo
Battery Voltage: 2.96 V
Brady Statistic AS VS Percent: 0.9 %
Brady Statistic RA Percent Paced: 0.24 %
HIGH POWER IMPEDANCE MEASURED VALUE: 59 Ohm
HighPow Impedance: 190 Ohm
HighPow Impedance: 40 Ohm
Lead Channel Impedance Value: 4047 Ohm
Lead Channel Impedance Value: 4047 Ohm
Lead Channel Impedance Value: 456 Ohm
Lead Channel Pacing Threshold Amplitude: 0.625 V
Lead Channel Pacing Threshold Amplitude: 1.625 V
Lead Channel Pacing Threshold Pulse Width: 0.4 ms
Lead Channel Pacing Threshold Pulse Width: 0.4 ms
Lead Channel Pacing Threshold Pulse Width: 0.4 ms
Lead Channel Sensing Intrinsic Amplitude: 5.125 mV
Lead Channel Sensing Intrinsic Amplitude: 7.5 mV
Lead Channel Sensing Intrinsic Amplitude: 7.5 mV
Lead Channel Setting Pacing Amplitude: 2 V
Lead Channel Setting Pacing Pulse Width: 0.4 ms
Lead Channel Setting Sensing Sensitivity: 0.3 mV
MDC IDC MSMT LEADCHNL LV IMPEDANCE VALUE: 304 Ohm
MDC IDC MSMT LEADCHNL RA SENSING INTR AMPL: 3 mV
MDC IDC MSMT LEADCHNL RV IMPEDANCE VALUE: 418 Ohm
MDC IDC MSMT LEADCHNL RV PACING THRESHOLD AMPLITUDE: 1.5 V
MDC IDC SESS DTM: 20160920174804
MDC IDC SET LEADCHNL LV PACING AMPLITUDE: 2.25 V
MDC IDC SET LEADCHNL LV PACING PULSEWIDTH: 0.4 ms
MDC IDC SET LEADCHNL RV PACING AMPLITUDE: 2.5 V
MDC IDC STAT BRADY AP VP PERCENT: 0.24 %
MDC IDC STAT BRADY AP VS PERCENT: 0.01 %
MDC IDC STAT BRADY AS VP PERCENT: 98.86 %
MDC IDC STAT BRADY RV PERCENT PACED: 99.1 %
Zone Setting Detection Interval: 300 ms
Zone Setting Detection Interval: 350 ms
Zone Setting Detection Interval: 370 ms
Zone Setting Detection Interval: 450 ms

## 2015-08-04 LAB — BASIC METABOLIC PANEL
BUN: 34 mg/dL — ABNORMAL HIGH (ref 7–25)
CO2: 26 mmol/L (ref 20–31)
Calcium: 10.2 mg/dL (ref 8.6–10.4)
Chloride: 100 mmol/L (ref 98–110)
Creat: 1.69 mg/dL — ABNORMAL HIGH (ref 0.60–0.93)
Glucose, Bld: 113 mg/dL — ABNORMAL HIGH (ref 65–99)
Potassium: 4.5 mmol/L (ref 3.5–5.3)
Sodium: 137 mmol/L (ref 135–146)

## 2015-08-04 MED ORDER — SPIRONOLACTONE 25 MG PO TABS: 25.0000 mg | ORAL_TABLET | Freq: Every day | ORAL | Status: DC

## 2015-08-04 MED ORDER — FUROSEMIDE 40 MG PO TABS: 40.0000 mg | ORAL_TABLET | Freq: Every day | ORAL | Status: DC

## 2015-08-04 NOTE — Assessment & Plan Note (Signed)
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

## 2015-08-04 NOTE — Assessment & Plan Note (Signed)
She now has low output sympotms

## 2015-08-04 NOTE — Assessment & Plan Note (Signed)
Coronary angiography September 2006 showed extensive coronary postprocessing calcification but without flow-limiting stenoses. Proximal RCA 30%, proximal LAD 40-50%, proximal OM1 50%. Subsequent Myoviews have shown anterior scar

## 2015-08-04 NOTE — Patient Instructions (Signed)
Lab work today ( bmet,bnp )   Pacemaker check this afternoon at 3:00 pm at [employer] with Dr.McLean at Heart failure clinic at [facility] at [facility] Thursday 08/20/15 at 2:20 pm Follow directions given.

## 2015-08-04 NOTE — Assessment & Plan Note (Signed)
Recent EF basically unchanged- 20% by echo 07/17/15

## 2015-08-04 NOTE — Progress Notes (Signed)
Add-on CRT-D device check in office per Kerin Ransom, [place]. Concern of SOB. Thresholds and sensing consistent with previous device measurements. Lead impedance trends stable over time. No mode switch episodes recorded. No ventricular arrhythmia episodes recorded. Patient bi-ventricularly pacing 99.9% of the time. Device programmed with appropriate safety margins---changed RV output from 3.25 to 2.5V. Changed LV output from 2.75 to 2.25V. Heart failure diagnostics reviewed and trends are stable for patient---No OptiVol abnormalities. Estimated longevity 4.55yrs. Pt referred to Dr. Aundra Dubin. ROV w/ MC in 52mo.

## 2015-08-04 NOTE — Progress Notes (Signed)
08/04/2015 Priscilla Anderson   [birth year]  [id number]  Primary Physician Cathlean Cower, MD Primary Cardiologist: Priscilla Sallyanne Kuster  HPI:  81 y/o AA female with a long-standing history of nonischemic cardiomyopathy. She has complete heart block. Her initial CRT pacemaker was placed in 4696, complicated by late coronary sinus lead dislodgment in 2006.  In 2007, it was  upgraded to a biventricular ICD (Medtronic [place]) with an epicardial left ventricular lead, (Medtronic [id number]).  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 She has known occlusion of left subclavian vein. 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  She has a Medtronic Sprint Fidelis [phone] dual coil active fixation defibrillator lead that is under advisory. For this reason her old RV pacemaker lead (Medtronic [id number] implanted 2003) was used for pace-sense features, the Sprint Fidelis lead was left in place for tachycardia therapies only. The right atrial lead is the original one placed in 2003 (Medtronic [id number])               She has responded very well to cardiac resynchronization therapy. She has well compensated heart failure (NYHA functional class I) despite a very low left ventricular ejection fraction, most recently estimated to be 25-30% by an echocardiogram in July of this 2014. She has moderate eccentric mitral insufficiency that is felt to be most likely secondary to cardiomyopathy. Coronary angiography in 2006 showed scattered mild to moderate coronary stenoses (40-50% LAD, 30% RCA, 2006).  Her nuclear stress test in December of 2013 was interpreted as showing a large area of anterior scar and this has been seen on nuclear stress tests performed as far back as 2004.              She was seen recently by Priscilla Cathlean Cower for dyspnea.  He adjusted her Lasix dose  for a few days, increased her Aldactone, and ordered an echo and Myoview. These were essentially unchanged from previous studies showing anterior scar with LVD- EF 20%. She is in the office today for follow up. In the interm the pt's granddaughter, Priscilla Anderson, an OB in [place], had the pt resume her Digoxin. The pt still has a sensation of vague dyspnea. She is very active and doesn't feel she can do what she had been able to do. She denies any orthopnea or edema.    Current Outpatient Prescriptions  Medication Sig Dispense Refill  . Artificial Tear Solution (SOOTHE XP) SOLN Apply 1 drop to eye daily as needed (dry eyes).    . Ascorbic Acid (VITAMIN C) 500 MG tablet Take 500 mg by mouth daily.      Marland Kitchen aspirin 81 MG tablet Take 81 mg by mouth daily.      . Carboxymethylcellul-Glycerin (REFRESH OPTIVE OP) Apply 1 drop to eye at bedtime.    . carvedilol (COREG) 12.5 MG tablet TAKE 1 TABLET TWICE A DAY 180 tablet 2  . diazepam (VALIUM) 5 MG tablet Take 1 tablet (5 mg total) by mouth at bedtime as needed for anxiety. 90 tablet 1  . digoxin (LANOXIN) 0.125 MG tablet Take 0.125 mg by mouth daily.    . furosemide (LASIX) 40 MG tablet Take 1-1.5 tablets (40-60 mg total) by mouth daily. Alternating between 40 mg & 60 mg every other day 135 tablet 3  . glucose blood (ACCU-CHEK  AVIVA) test strip Test two times daily. Code 250.02 100 each 11  . ibandronate (BONIVA) 150 MG tablet Take 150 mg by mouth every 30 (thirty) days. Take in the morning with a full glass of water, on an empty stomach, and do not take anything else by mouth or lie down for the next 30 min.    Marland Kitchen JANUVIA 100 MG tablet TAKE 1 TABLET DAILY 90 tablet 1  . Lancets MISC Test as directed two times daily. Code 250.02 100 each 11  . losartan (COZAAR) 50 MG tablet Take 1 tablet (50 mg total) by mouth daily. 90 tablet 3  . metFORMIN (GLUCOPHAGE-XR) 500 MG 24 hr tablet Take 1 tablet (500 mg total) by mouth daily with breakfast. 180 tablet  3  . simvastatin (ZOCOR) 20 MG tablet Take 1 tablet (20 mg total) by mouth at bedtime. 90 tablet 3  . spironolactone (ALDACTONE) 25 MG tablet Take 1 tablet (25 mg total) by mouth daily. 90 tablet 2  . Wheat Dextrin (BENEFIBER) POWD Take by mouth. 2 tablespoons once daily     No current facility-administered medications for this visit.    Allergies  Allergen Reactions  . Lanoxin [Digoxin] Nausea Only    Nausea and wgt loss    Social History   Social History  . Marital Status: Widowed    Spouse Name: N/A  . Number of Children: 2  . Years of Education: N/A   Occupational History  . retired Investment banker, corporate. report clerk    Social History Main Topics  . Smoking status: Never Smoker   . Smokeless tobacco: Never Used  . Alcohol Use: No  . Drug Use: 2.00 per week  . Sexual Activity: Not on file   Other Topics Concern  . Not on file   Social History Narrative   Husband now with Multiple Myeloma- see Priscilla. Juanita Craver     Review of Systems: General: negative for chills, fever, night sweats or weight changes.  Cardiovascular: negative for chest pain, dyspnea on exertion, edema, orthopnea, palpitations, paroxysmal nocturnal dyspnea or shortness of breath Dermatological: negative for rash Respiratory: negative for cough or wheezing Urologic: negative for hematuria Abdominal: negative for nausea, vomiting, diarrhea, bright red blood per rectum, melena, or hematemesis Neurologic: negative for visual changes, syncope, or dizziness All other systems reviewed and are otherwise negative except as noted above.    Blood pressure 114/80, pulse 94, height 5' 4.5" (1.638 m), weight 122 lb 3.2 oz (55.43 kg).  General appearance: alert, cooperative and no distress Neck: no JVD Lungs: clear to auscultation bilaterally Heart: regular rate and rhythm and S3 present Extremities: extremities normal, atraumatic, no cyanosis or edema Skin: Skin color, texture, turgor normal. No rashes or  lesions Neurologic: Grossly normal  EKG Paced  ASSESSMENT AND PLAN:   Chronic systolic congestive heart failure, NYHA class 1 She now has low output sympotms  Nonischemic dilated cardiomyopathy Recent EF basically unchanged- 20% by echo 07/17/15  Coronary atherosclerosis Coronary angiography September 2006 showed extensive coronary postprocessing calcification but without flow-limiting stenoses. Proximal RCA 30%, proximal LAD 40-50%, proximal OM1 50%. Subsequent Myoviews have shown anterior scar  Chronic renal insufficiency, stage III (moderate) GFR 38  Type 2 diabetes mellitus with renal manifestations not at goal .  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 He would stop Digoxin, she had nausea and anorexia on this in the past and she has CRI with a tendency to hyperkalemia. We did have her go for a formal pacer check at the [facility] office today. Priscilla Sallyanne Kuster would like to refer her to the CHF clinic and this will be arranged. I ordered a BMP and contacted the pt's granddaughter, Priscilla Anderson, to let her know our thoughts.   Kerin Ransom K PA-C 08/04/2015 1:26 PM

## 2015-08-04 NOTE — Assessment & Plan Note (Signed)
GFR 38 

## 2015-08-05 LAB — BRAIN NATRIURETIC PEPTIDE: Brain Natriuretic Peptide: 984.7 pg/mL — ABNORMAL HIGH (ref 0.0–100.0)

## 2015-08-07 ENCOUNTER — Telehealth: Payer: Self-pay | Admitting: *Deleted

## 2015-08-07 NOTE — Telephone Encounter (Signed)
Spoke with pt, she voiced understanding of new medicine regimen for furosemide.

## 2015-08-07 NOTE — Telephone Encounter (Signed)
-----   Message from Erlene Quan, [place] sent at 08/05/2015 12:54 PM EDT ----- Please let pt know her labs suggest some degree of fluid overload. I would suggest she increase her Lasix to 40 mg BID  for 3 days, then go back to 60 mg alternating with 40 mg every other day.  Kerin Ransom PA-C 08/05/2015 12:54 PM

## 2015-08-17 ENCOUNTER — Ambulatory Visit (HOSPITAL_COMMUNITY)
Admission: RE | Admit: 2015-08-17 | Discharge: 2015-08-17 | Disposition: A | Discharge status: Home / Self Care | Payer: Medicare Other | Source: Ambulatory Visit | Attending: Cardiology | Admitting: Cardiology

## 2015-08-17 ENCOUNTER — Encounter (HOSPITAL_COMMUNITY): Payer: Self-pay

## 2015-08-17 ENCOUNTER — Encounter: Payer: Self-pay | Admitting: Cardiology

## 2015-08-17 VITALS — BP 90/62 | HR 99 | Wt 125.5 lb

## 2015-08-17 DIAGNOSIS — Z7982 Long term (current) use of aspirin: Secondary | ICD-10-CM | POA: Insufficient documentation

## 2015-08-17 DIAGNOSIS — I340 Nonrheumatic mitral (valve) insufficiency: Secondary | ICD-10-CM | POA: Diagnosis not present

## 2015-08-17 DIAGNOSIS — I5022 Chronic systolic (congestive) heart failure: Secondary | ICD-10-CM | POA: Insufficient documentation

## 2015-08-17 DIAGNOSIS — I428 Other cardiomyopathies: Secondary | ICD-10-CM | POA: Insufficient documentation

## 2015-08-17 DIAGNOSIS — Z79899 Other long term (current) drug therapy: Secondary | ICD-10-CM | POA: Insufficient documentation

## 2015-08-17 DIAGNOSIS — Z95810 Presence of automatic (implantable) cardiac defibrillator: Secondary | ICD-10-CM | POA: Insufficient documentation

## 2015-08-17 DIAGNOSIS — E785 Hyperlipidemia, unspecified: Secondary | ICD-10-CM | POA: Insufficient documentation

## 2015-08-17 DIAGNOSIS — N183 Chronic kidney disease, stage 3 unspecified: Secondary | ICD-10-CM

## 2015-08-17 DIAGNOSIS — I059 Rheumatic mitral valve disease, unspecified: Secondary | ICD-10-CM | POA: Diagnosis not present

## 2015-08-17 DIAGNOSIS — I2510 Atherosclerotic heart disease of native coronary artery without angina pectoris: Secondary | ICD-10-CM | POA: Insufficient documentation

## 2015-08-17 DIAGNOSIS — Z8249 Family history of ischemic heart disease and other diseases of the circulatory system: Secondary | ICD-10-CM | POA: Insufficient documentation

## 2015-08-17 DIAGNOSIS — I10 Essential (primary) hypertension: Secondary | ICD-10-CM

## 2015-08-17 DIAGNOSIS — N189 Chronic kidney disease, unspecified: Secondary | ICD-10-CM | POA: Diagnosis not present

## 2015-08-17 MED ORDER — IVABRADINE HCL 5 MG PO TABS: 5.0000 mg | ORAL_TABLET | Freq: Two times a day (BID) | ORAL | Status: DC

## 2015-08-17 MED ORDER — FUROSEMIDE 40 MG PO TABS: 40.0000 mg | ORAL_TABLET | Freq: Two times a day (BID) | ORAL | Status: DC

## 2015-08-17 NOTE — Progress Notes (Signed)
Patient ID: KARALYN KADEL, female   DOB: December 09, 1933, 81 y.o.   MRN: [id number] PCP: Dr. Jenny Reichmann Primary Cardiologist: Dr. Sallyanne Kuster HF Cardiologist: Dr. Aundra Dubin  81 yo with long history of chronic systolic CHF from nonischemic cardiomyopathy presents for CHF clinic evaluation.  She had cardiac cath in 2006 with nonobstructive CAD.  Cardiolites have shown anterior fixed defect but no ischemia (12/13, 9/16).  Most recent echo in 9/16 showed EF 20% with moderate RV systolic dysfunction and moderate-severe functional MR.  She has a Medtronic CRT-D device.    Over the last couple of months, she has been more short of breath with exertion.  She is still very functional, able to do her ADLs and to grocery shop.  However, she is short of breath with stairs and with heavier exertion like carrying a load.  Generally no problems walking a short distance.  No chest pain.  No orthopnea/PND.  No tachypalpitations.  She has had a chronic dry cough, says she tends to get it every fall.  Delsym has helped but wears off.  She increased her Lasix to 40 mg bid for 3 days not long ago.  She felt like this helped her breathing.  Weight is up a few lbs at home. HR runs relatively high, 99 bpm in office today.  Of note, she recently tried to restart digoxin but developed nausea (had this with digoxin in the past) so stopped it.   Optivol: Fluid index < threshold but impedance is trending down. No atrial fibrillation or VT.   Labs (8/16): TSH normal Labs (9/16): K 4.5, creatinine 1.69, BNP 985  PMH: 1. CHB: s/p Medtronic CRT-D device.  2. 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  3. CKD 4. Hyperlipidemia 5. Chronic systolic CHF: Nonischemic cardiomyopathy.  LHC (2006) with 40-50% LAD stenosis, 30% RCA stenosis.  Cardiolite (12/13) with no ischemia.  Medtronic CRT-D.  Echo (9/16) with EF 20%, severe LV dilation, restrictive diastolic function, moderate-severe MR (suspect functional), normal RV size with  moderately decreased systolic function.  Cardiolite (9/16) with EF 16%, anterior scar (same as prior), no ischemia.  6. Mitral regurgitation: Moderate to severe on 9/16 echo, likely functional.  SH: Lives in [place], widow, retired, no smoking, 2 children, no ETOH.   FH: 1 brother with CHF, 1 brother with history of MI.   ROS: All systems reviewed and negative except as per HPI.   Current Outpatient Prescriptions  Medication Sig Dispense Refill  . Artificial Tear Solution (SOOTHE XP) SOLN Apply 1 drop to eye daily as needed (dry eyes).    . Ascorbic Acid (VITAMIN C) 500 MG tablet Take 500 mg by mouth daily.      Marland Kitchen aspirin 81 MG tablet Take 81 mg by mouth daily.      . Carboxymethylcellul-Glycerin (REFRESH OPTIVE OP) Apply 1 drop to eye at bedtime.    . carvedilol (COREG) 12.5 MG tablet TAKE 1 TABLET TWICE A DAY 180 tablet 2  . diazepam (VALIUM) 5 MG tablet Take 1 tablet (5 mg total) by mouth at bedtime as needed for anxiety. 90 tablet 1  . glucose blood (ACCU-CHEK AVIVA) test strip Test two times daily. Code 250.02 100 each 11  . ibandronate (BONIVA) 150 MG tablet Take 150 mg by mouth every 30 (thirty) days. Take in the morning with a full glass of water, on an empty stomach, and do not take anything else by mouth or lie down for the next 30 min.    Marland Kitchen  JANUVIA 100 MG tablet TAKE 1 TABLET DAILY 90 tablet 1  . Lancets MISC Test as directed two times daily. Code 250.02 100 each 11  . losartan (COZAAR) 50 MG tablet Take 1 tablet (50 mg total) by mouth daily. 90 tablet 3  . metFORMIN (GLUCOPHAGE-XR) 500 MG 24 hr tablet Take 1 tablet (500 mg total) by mouth daily with breakfast. 180 tablet 3  . simvastatin (ZOCOR) 20 MG tablet Take 1 tablet (20 mg total) by mouth at bedtime. 90 tablet 3  . spironolactone (ALDACTONE) 25 MG tablet Take 1 tablet (25 mg total) by mouth daily. 90 tablet 2  . Wheat Dextrin (BENEFIBER) POWD Take by mouth. 2 tablespoons once daily    . furosemide (LASIX) 40 MG tablet  Take 1 tablet (40 mg total) by mouth 2 (two) times daily. 180 tablet 3  . ivabradine (CORLANOR) 5 MG TABS tablet Take 1 tablet (5 mg total) by mouth 2 (two) times daily with a meal. 60 tablet 6   No current facility-administered medications for this encounter.   BP 90/62 mmHg  Pulse 99  Wt 125 lb 8 oz (56.926 kg)  SpO2 98% General: NAD Neck: JVP 8-9 cm, no thyromegaly or thyroid nodule.  Lungs: Clear to auscultation bilaterally with normal respiratory effort. CV: Nondisplaced PMI.  Heart regular S1/S2, no S3/S4, 3/6 HSM LLSB/apex.  No peripheral edema.  No carotid bruit.  Normal pedal pulses.  Abdomen: Soft, nontender, no hepatosplenomegaly, no distention.  Skin: Intact without lesions or rashes.  Neurologic: Alert and oriented x 3.  Psych: Normal affect. Extremities: No clubbing or cyanosis.  HEENT: Normal.   Assessment/Plan: 1. Chronic systolic CHF: Nonischemic cardiomyopathy. EF 20% on last echo with moderately decreased RV systolic function and moderate to severe functional MR.  Medtronic CRT-D.  NYHA class II-III symptoms, somewhat worsened recently.  Mild volume overload on exam, thoracic impedance trending down on Optivol.  HR around 100, sinus.  - I will have her increase Lasix to 40 mg bid rather than 60 daily alternating with 40 daily.  BMET/BNP in 1 week.  - Add Corlanor 5 mg bid with elevated HR.  This hopefully will help symptomatically. - She has not tolerated digoxin due to nausea. - Continue current Coreg, losartan, and spironolactone.  She does not have BP room to titrate these meds or add Bidil.  - I will arrange for CPX.   2. CKD: Follow BMET with increased Lasix.  3. Mitral regurgitation: Moderate to severe MR, appears to be functional.   Loralie Champagne 08/17/2015

## 2015-08-17 NOTE — Progress Notes (Signed)
Medication Samples have been provided to the patient.  Drug name: Corlanor  Qty: 42  LOT: [id number]   Exp.Date: 09/17  The patient has been instructed regarding the correct time, dose, and frequency of taking this medication, including desired effects and most common side effects.   Priscilla Anderson M 12:56 PM 08/17/2015

## 2015-08-17 NOTE — Patient Instructions (Signed)
INCREASE Lasix to 40 mg, one tab twice a day START Corlanor 5 mg, one tab twice a day  Labs needed in 1 week (BMET,BNP)  Your physician has recommended that you have a cardiopulmonary stress test (CPX). CPX testing is a non-invasive measurement of heart and lung function. It replaces a traditional treadmill stress test. This type of test provides a tremendous amount of information that relates not only to your present condition but also for future outcomes. This test combines measurements of you ventilation, respiratory gas exchange in the lungs, electrocardiogram (EKG), blood pressure and physical response before, during, and following an exercise protocol.  Your physician recommends that you schedule a follow-up appointment in: 2-3 weeks  Do the following things EVERYDAY: 1) Weigh yourself in the morning before breakfast. Write it down and keep it in a log. 2) Take your medicines as prescribed 3) Eat low salt foods-Limit salt (sodium) to 2000 mg per day.  4) Stay as active as you can everyday 5) Limit all fluids for the day to less than 2 liters 6)

## 2015-08-17 NOTE — Progress Notes (Signed)
Advanced Heart Failure Medication Review by a Pharmacist  Does the patient  feel that his/her medications are working for him/her?  yes  Has the patient been experiencing any side effects to the medications prescribed?  no  Does the patient measure his/her own blood pressure or blood glucose at home?  yes   Does the patient have any problems obtaining medications due to transportation or finances?   no  Understanding of regimen: good Understanding of indications: good Potential of compliance: good    Pharmacist comments:  Priscilla Anderson is a pleasant 81 yo F presenting with a current medication list. She reports good compliance with all of her medications. The only discrepancy she noted was that she is no longer taking digoxin d/t excessive nausea after taking it for 3 days. This was reconciled on her list and she did not have any other medication-related questions or concerns for me at this time.   Priscilla Anderson, PharmD, BCPS, CPP Clinical Pharmacist Pager: [phone] Phone: [phone] 08/17/2015 11:46 AM

## 2015-08-19 ENCOUNTER — Telehealth (HOSPITAL_COMMUNITY): Payer: Self-pay | Admitting: *Deleted

## 2015-08-19 NOTE — Telephone Encounter (Signed)
Pt called c/o cough and requesting we call her in a rx cough med.  She states it the cough has not gotten any better since increasing her Lasix and she feels her cough is not fluid related.  She states she has been taking Delsym cough med and it helps for a while but when it wears off the coughing returns and she states her body is sore from coughing so much.  Pt's wt is down since increasing the Lasix.  Advised pt to call pcp, she is agreeable

## 2015-08-20 ENCOUNTER — Encounter (HOSPITAL_COMMUNITY): Payer: Medicare Other

## 2015-08-21 ENCOUNTER — Telehealth (HOSPITAL_COMMUNITY): Payer: Self-pay | Admitting: Pharmacist

## 2015-08-21 NOTE — Telephone Encounter (Signed)
Corlanor PA was approved through [employer] through 08/20/2016.   Ruta Hinds. Velva Harman, PharmD, BCPS, CPP Clinical Pharmacist Pager: [phone] Phone: [phone] 08/21/2015 3:11 PM

## 2015-08-24 ENCOUNTER — Ambulatory Visit (HOSPITAL_COMMUNITY)
Admission: RE | Admit: 2015-08-24 | Discharge: 2015-08-24 | Disposition: A | Discharge status: Home / Self Care | Payer: Medicare Other | Source: Ambulatory Visit | Attending: Cardiology | Admitting: Cardiology

## 2015-08-24 ENCOUNTER — Other Ambulatory Visit (HOSPITAL_COMMUNITY): Payer: Medicare Other

## 2015-08-24 DIAGNOSIS — I5022 Chronic systolic (congestive) heart failure: Secondary | ICD-10-CM | POA: Insufficient documentation

## 2015-08-24 LAB — BASIC METABOLIC PANEL
ANION GAP: 10 (ref 5–15)
BUN: 37 mg/dL — ABNORMAL HIGH (ref 6–20)
CO2: 27 mmol/L (ref 22–32)
Calcium: 9.7 mg/dL (ref 8.9–10.3)
Chloride: 98 mmol/L — ABNORMAL LOW (ref 101–111)
Creatinine, Ser: 2.06 mg/dL — ABNORMAL HIGH (ref 0.44–1.00)
GFR calc Af Amer: 25 mL/min — ABNORMAL LOW (ref >60)
GFR, EST NON AFRICAN AMERICAN: 22 mL/min — AB (ref >60)
GLUCOSE: 207 mg/dL — AB (ref 65–99)
POTASSIUM: 4.2 mmol/L (ref 3.5–5.1)
Sodium: 135 mmol/L (ref 135–145)

## 2015-08-24 LAB — BRAIN NATRIURETIC PEPTIDE: B NATRIURETIC PEPTIDE 5: 808 pg/mL — AB (ref 0.0–100.0)

## 2015-08-25 ENCOUNTER — Other Ambulatory Visit (HOSPITAL_COMMUNITY): Payer: Self-pay | Admitting: *Deleted

## 2015-08-25 DIAGNOSIS — I5022 Chronic systolic (congestive) heart failure: Secondary | ICD-10-CM

## 2015-08-25 MED ORDER — IVABRADINE HCL 5 MG PO TABS: 5.0000 mg | ORAL_TABLET | Freq: Two times a day (BID) | ORAL | Status: DC

## 2015-08-27 ENCOUNTER — Ambulatory Visit (HOSPITAL_COMMUNITY): Payer: Medicare Other | Attending: Cardiology

## 2015-08-27 ENCOUNTER — Other Ambulatory Visit (HOSPITAL_COMMUNITY): Payer: Self-pay | Admitting: *Deleted

## 2015-08-27 DIAGNOSIS — I5022 Chronic systolic (congestive) heart failure: Secondary | ICD-10-CM

## 2015-08-27 MED ORDER — IVABRADINE HCL 5 MG PO TABS: 5.0000 mg | ORAL_TABLET | Freq: Two times a day (BID) | ORAL | Status: DC

## 2015-08-28 ENCOUNTER — Telehealth (HOSPITAL_COMMUNITY): Payer: Self-pay

## 2015-08-28 ENCOUNTER — Other Ambulatory Visit (HOSPITAL_COMMUNITY): Payer: Self-pay | Admitting: *Deleted

## 2015-08-28 DIAGNOSIS — R0600 Dyspnea, unspecified: Secondary | ICD-10-CM | POA: Diagnosis not present

## 2015-08-28 DIAGNOSIS — I5022 Chronic systolic (congestive) heart failure: Secondary | ICD-10-CM

## 2015-08-28 NOTE — Telephone Encounter (Signed)
Patient returning call from Lynn who called her yesterday.  Routing to [place].  Told patient I would give Priscilla Anderson the message to call back

## 2015-08-31 ENCOUNTER — Other Ambulatory Visit: Payer: Self-pay | Admitting: Internal Medicine

## 2015-09-01 ENCOUNTER — Ambulatory Visit (HOSPITAL_COMMUNITY)
Admission: RE | Admit: 2015-09-01 | Discharge: 2015-09-01 | Disposition: A | Discharge status: Home / Self Care | Payer: Medicare Other | Source: Ambulatory Visit | Attending: Cardiology | Admitting: Cardiology

## 2015-09-01 ENCOUNTER — Encounter: Payer: Self-pay | Admitting: Cardiovascular Disease

## 2015-09-01 VITALS — BP 98/58 | HR 83 | Wt 120.8 lb

## 2015-09-01 DIAGNOSIS — I5022 Chronic systolic (congestive) heart failure: Secondary | ICD-10-CM

## 2015-09-01 DIAGNOSIS — I340 Nonrheumatic mitral (valve) insufficiency: Secondary | ICD-10-CM | POA: Insufficient documentation

## 2015-09-01 DIAGNOSIS — N189 Chronic kidney disease, unspecified: Secondary | ICD-10-CM | POA: Diagnosis not present

## 2015-09-01 DIAGNOSIS — I10 Essential (primary) hypertension: Secondary | ICD-10-CM | POA: Diagnosis not present

## 2015-09-01 LAB — BASIC METABOLIC PANEL
ANION GAP: 11 (ref 5–15)
BUN: 33 mg/dL — ABNORMAL HIGH (ref 6–20)
CO2: 27 mmol/L (ref 22–32)
Calcium: 9.7 mg/dL (ref 8.9–10.3)
Chloride: 100 mmol/L — ABNORMAL LOW (ref 101–111)
Creatinine, Ser: 2.12 mg/dL — ABNORMAL HIGH (ref 0.44–1.00)
GFR calc Af Amer: 24 mL/min — ABNORMAL LOW (ref >60)
GFR calc non Af Amer: 21 mL/min — ABNORMAL LOW (ref >60)
GLUCOSE: 152 mg/dL — AB (ref 65–99)
POTASSIUM: 4.6 mmol/L (ref 3.5–5.1)
Sodium: 138 mmol/L (ref 135–145)

## 2015-09-01 LAB — BRAIN NATRIURETIC PEPTIDE: B Natriuretic Peptide: 960 pg/mL — ABNORMAL HIGH (ref 0.0–100.0)

## 2015-09-01 MED ORDER — FUROSEMIDE 40 MG PO TABS: 40.0000 mg | ORAL_TABLET | Freq: Every day | ORAL | Status: DC

## 2015-09-01 NOTE — Patient Instructions (Signed)
Decrease Furosemide (Lasix) to 40 mg DAILY  Labs today  Labs in 2 weeks  Your physician recommends that you schedule a follow-up appointment in: 3 weeks

## 2015-09-02 NOTE — Progress Notes (Signed)
Patient ID: Priscilla Anderson, female   DOB: 01-06-1934, 81 y.o.   MRN: [id number] PCP: Dr. Jenny Reichmann Primary Cardiologist: Dr. Sallyanne Kuster HF Cardiologist: Dr. Aundra Dubin  81 yo with long history of chronic systolic CHF from nonischemic cardiomyopathy presents for CHF clinic evaluation.  She had cardiac cath in 2006 with nonobstructive CAD.  Cardiolites have shown anterior fixed defect but no ischemia (12/13, 9/16).  Most recent echo in 9/16 showed EF 20% with moderate RV systolic dysfunction and moderate-severe functional MR.  She has a Medtronic CRT-D device.    Over the last couple of months, she had become more short of breath with exertion.  She is still very functional, able to do her ADLs and to grocery shop.  However, she is short of breath with stairs and with heavier exertion like carrying a load.  Generally no problems walking a short distance.  No chest pain.  No orthopnea/PND.  No tachypalpitations.  Of note, she recently tried to restart digoxin but developed nausea (had this with digoxin in the past) so stopped it.   At last appointment, I started her on Corlanor and increased her Lasix to 40 mg bid.  She felt much better with these changes.  She denies further dyspnea, says she can walk however much she wants without shortness of breath, but she still gets tired.  No chest pain.  No orthopnea/PND.  CPX was done in 10/16.  This probably showed a significant CHF limitation.  VE/VCO2 slope was elevated out of proportion to the VO2 max.   Optivol: Fluid index < threshold and impedance trending up.   Labs (8/16): TSH normal Labs (9/16): K 4.5, creatinine 1.69, BNP 985 Labs (10/16): K 4.2, creatinine 2.06, BNP 808  PMH: 1. CHB: s/p Medtronic CRT-D device.  2. 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  3. CKD 4. Hyperlipidemia 5. Chronic systolic CHF: Nonischemic cardiomyopathy.  LHC (2006) with 40-50% LAD stenosis, 30% RCA stenosis.  Cardiolite (12/13) with no ischemia.  Medtronic  CRT-D.  Echo (9/16) with EF 20%, severe LV dilation, restrictive diastolic function, moderate-severe MR (suspect functional), normal RV size with moderately decreased systolic function.  Cardiolite (9/16) with EF 16%, anterior scar (same as prior), no ischemia.  CPX (10/16): peak VO2 12 (72% predicted), VE/VCO2 slope 53, RER 1.12, OUES 0.8 => VE/VCO2 slope suggests severe HR limitation out of proportion to peak VO2.  6. Mitral regurgitation: Moderate to severe on 9/16 echo, likely functional.  SH: Lives in [place], widow, retired, no smoking, 2 children, no ETOH.   FH: 1 brother with CHF, 1 brother with history of MI.   ROS: All systems reviewed and negative except as per HPI.   Current Outpatient Prescriptions  Medication Sig Dispense Refill  . Artificial Tear Solution (SOOTHE XP) SOLN Apply 1 drop to eye daily as needed (dry eyes).    . Ascorbic Acid (VITAMIN C) 500 MG tablet Take 500 mg by mouth daily.      Marland Kitchen aspirin 81 MG tablet Take 81 mg by mouth daily.      . Carboxymethylcellul-Glycerin (REFRESH OPTIVE OP) Apply 1 drop to eye at bedtime.    . carvedilol (COREG) 12.5 MG tablet TAKE 1 TABLET TWICE A DAY 180 tablet 2  . diazepam (VALIUM) 5 MG tablet Take 1 tablet (5 mg total) by mouth at bedtime as needed for anxiety. 90 tablet 1  . furosemide (LASIX) 40 MG tablet Take 1 tablet (40 mg total) by mouth daily. 180 tablet 3  .  glucose blood (ACCU-CHEK AVIVA) test strip Test two times daily. Code 250.02 100 each 11  . ibandronate (BONIVA) 150 MG tablet TAKE 1 TABLET ONCE A MONTH 3 tablet 3  . ivabradine (CORLANOR) 5 MG TABS tablet Take 1 tablet (5 mg total) by mouth 2 (two) times daily with a meal. 180 tablet 2  . JANUVIA 100 MG tablet TAKE 1 TABLET DAILY 90 tablet 1  . Lancets MISC Test as directed two times daily. Code 250.02 100 each 11  . losartan (COZAAR) 50 MG tablet Take 1 tablet (50 mg total) by mouth daily. 90 tablet 3  . metFORMIN (GLUCOPHAGE-XR) 500 MG 24 hr tablet Take 1 tablet  (500 mg total) by mouth daily with breakfast. 180 tablet 3  . simvastatin (ZOCOR) 20 MG tablet Take 1 tablet (20 mg total) by mouth at bedtime. 90 tablet 3  . spironolactone (ALDACTONE) 25 MG tablet Take 1 tablet (25 mg total) by mouth daily. 90 tablet 2  . Wheat Dextrin (BENEFIBER) POWD Take by mouth. 2 tablespoons once daily     No current facility-administered medications for this encounter.   BP 98/58 mmHg  Pulse 83  Wt 120 lb 12.8 oz (54.795 kg)  SpO2 95% General: NAD Neck: JVP 7 cm, no thyromegaly or thyroid nodule.  Lungs: Clear to auscultation bilaterally with normal respiratory effort. CV: Nondisplaced PMI.  Heart regular S1/S2, no S3/S4, 2/6 HSM apex.  No peripheral edema.  No carotid bruit.  Normal pedal pulses.  Abdomen: Soft, nontender, no hepatosplenomegaly, no distention.  Skin: Intact without lesions or rashes.  Neurologic: Alert and oriented x 3.  Psych: Normal affect. Extremities: No clubbing or cyanosis.  HEENT: Normal.   Assessment/Plan: 1. Chronic systolic CHF: Nonischemic cardiomyopathy. EF 20% on last echo with moderately decreased RV systolic function and moderate to severe functional MR.  Medtronic CRT-D.  CPX recently showed a probable severe functional limitation due to HF.  NYHA class II symptoms, improved after starting Corlanor and increasing Lasix. On exam and by Optivol, she seems euvolemic.   - With rise in creatinine, decrease Lasix back to 40 mg daily.  - Continue Corlanor, this has made a big difference with her symptoms.  - She has not tolerated digoxin due to nausea. - Continue current Coreg, losartan, and spironolactone.  She does not have BP room to titrate these meds or add Bidil.  2. CKD: BMET today and repeat in 2 wks after decreasing Lasix.  3. Mitral regurgitation: Moderate to severe MR, appears to be functional.   Followup in 3 wks  Loralie Champagne 09/02/2015

## 2015-09-04 ENCOUNTER — Ambulatory Visit: Payer: Medicare Other | Admitting: Internal Medicine

## 2015-09-09 ENCOUNTER — Ambulatory Visit (HOSPITAL_COMMUNITY)
Admission: RE | Admit: 2015-09-09 | Discharge: 2015-09-09 | Disposition: A | Discharge status: Home / Self Care | Payer: Medicare Other | Source: Ambulatory Visit | Attending: Cardiology | Admitting: Cardiology

## 2015-09-09 DIAGNOSIS — I5022 Chronic systolic (congestive) heart failure: Secondary | ICD-10-CM | POA: Diagnosis not present

## 2015-09-09 LAB — BASIC METABOLIC PANEL
Anion gap: 12 (ref 5–15)
BUN: 27 mg/dL — AB (ref 6–20)
CALCIUM: 9.7 mg/dL (ref 8.9–10.3)
CO2: 25 mmol/L (ref 22–32)
CREATININE: 1.75 mg/dL — AB (ref 0.44–1.00)
Chloride: 103 mmol/L (ref 101–111)
GFR, EST AFRICAN AMERICAN: 31 mL/min — AB (ref >60)
GFR, EST NON AFRICAN AMERICAN: 27 mL/min — AB (ref >60)
Glucose, Bld: 121 mg/dL — ABNORMAL HIGH (ref 65–99)
Potassium: 4.5 mmol/L (ref 3.5–5.1)
SODIUM: 140 mmol/L (ref 135–145)

## 2015-09-10 DIAGNOSIS — Z23 Encounter for immunization: Secondary | ICD-10-CM | POA: Diagnosis not present

## 2015-09-15 ENCOUNTER — Encounter: Payer: Self-pay | Admitting: Internal Medicine

## 2015-09-15 ENCOUNTER — Ambulatory Visit (INDEPENDENT_AMBULATORY_CARE_PROVIDER_SITE_OTHER): Payer: Medicare Other | Admitting: Internal Medicine

## 2015-09-15 VITALS — BP 104/60 | HR 73 | Temp 97.8°F | Ht 64.0 in | Wt 122.0 lb

## 2015-09-15 DIAGNOSIS — I10 Essential (primary) hypertension: Secondary | ICD-10-CM | POA: Diagnosis not present

## 2015-09-15 DIAGNOSIS — J309 Allergic rhinitis, unspecified: Secondary | ICD-10-CM

## 2015-09-15 DIAGNOSIS — I2510 Atherosclerotic heart disease of native coronary artery without angina pectoris: Secondary | ICD-10-CM

## 2015-09-15 DIAGNOSIS — E1129 Type 2 diabetes mellitus with other diabetic kidney complication: Secondary | ICD-10-CM | POA: Diagnosis not present

## 2015-09-15 MED ORDER — FEXOFENADINE HCL 180 MG PO TABS: 180.0000 mg | ORAL_TABLET | Freq: Every day | ORAL | Status: DC

## 2015-09-15 MED ORDER — TRIAMCINOLONE ACETONIDE 55 MCG/ACT NA AERO: 2.0000 | INHALATION_SPRAY | Freq: Every day | NASAL | Status: DC

## 2015-09-15 MED ORDER — METHYLPREDNISOLONE ACETATE 80 MG/ML IJ SUSP
80.0000 mg | Freq: Once | INTRAMUSCULAR | Status: AC
  Administered 2015-09-15: 80 mg via INTRAMUSCULAR

## 2015-09-15 NOTE — Assessment & Plan Note (Signed)
stable overall by history and exam, recent data reviewed with pt, and pt to continue medical treatment as before,  to f/u any worsening symptoms or concerns Lab Results  Component Value Date   HGBA1C 6.5 03/05/2015

## 2015-09-15 NOTE — Assessment & Plan Note (Signed)
Mild uncontrolled, for depomedrol IM, change claritin to allegra daily prn, and nasacort asd,  to f/u any worsening symptoms or concerns

## 2015-09-15 NOTE — Progress Notes (Signed)
Pre visit review using our clinic review tool, if applicable. No additional management support is needed unless otherwise documented below in the visit note. 

## 2015-09-15 NOTE — Assessment & Plan Note (Signed)
stable overall by history and exam, recent data reviewed with pt, and pt to continue medical treatment as before,  to f/u any worsening symptoms or concerns BP Readings from Last 3 Encounters:  09/15/15 104/60  09/01/15 98/58  08/17/15 90/62

## 2015-09-15 NOTE — Patient Instructions (Signed)
You had the steroid shot today  Please take all new medication as prescribed  - the allegra , and nasacort as directed  OK to hold on taking the claritin  Please continue all other medications as before, and refills have been done if requested.  Please have the pharmacy call with any other refills you may need.  Please continue your efforts at being more active, low cholesterol diet, and weight control.  Please keep your appointments with your specialists as you may have planned  Please go to the LAB in the Basement (turn left off the elevator) for the tests to be done tomorrow  You will be contacted by phone if any changes need to be made immediately.  Otherwise, you will receive a letter about your results with an explanation, but please check with MyChart first.  Please remember to sign up for MyChart if you have not done so, as this will be important to you in the future with finding out test results, communicating by private email, and scheduling acute appointments online when needed.

## 2015-09-15 NOTE — Progress Notes (Signed)
Subjective:    Patient ID: Priscilla Anderson, female    DOB: Nov 08, 1934, 81 y.o.   MRN: [id number]  HPI  Here to f/u; overall doing ok,  Pt denies chest pain, increasing sob or doe, wheezing, orthopnea, PND, increased LE swelling, palpitations, dizziness or syncope.  Pt denies new neurological symptoms such as new headache, or facial or extremity weakness or numbness.  Pt denies polydipsia, polyuria, or low sugar episode.   Pt denies new neurological symptoms such as new headache, or facial or extremity weakness or numbness.   Pt states overall good compliance with meds, mostly trying to follow appropriate diet, with wt overall stable,  CBG;s still in low 100's .Does have several wks ongoing nasal allergy symptoms with clearish congestion, ST itch and sneezing, without fever, pain, cough, swelling or wheezing. Past Medical History  Diagnosis Date  . Nonischemic cardiomyopathy ([place]) 09/22/2011  . CHF (congestive heart failure) ([place])   . Hyperlipidemia   . Hypertension   . Osteopenia   . Adenomatous colon polyp   . Polyp of larynx   . CAD (coronary artery disease)   . AICD (automatic cardioverter/defibrillator) present   . Type II diabetes mellitus [facility])    Past Surgical History  Procedure Laterality Date  . Cardiac defibrillator placement  05/21/2002    s/p re-do pacemaker/defibrillator October 2011- [place].  . Colonoscopy    . Implantable cardioverter defibrillator generator change  09/01/2010    Medtronic  . Biv icd genertaor change out N/A 07/29/2014    Procedure: BIV ICD GENERTAOR CHANGE OUT;  Surgeon: Sanda Klein, MD;  Location: [facility] CATH LAB;  Service: Cardiovascular;  Laterality: N/A;  . Vaginal hysterectomy    . Breast cyst excision Left 1980  . Cardiac catheterization  07/28/2005    noncritical CAD  . Cardiac catheterization      "I've had several"  . Microlaryngoscopy with co2 laser and excision of vocal cord lesion  08/2002    /notes 03/29/2011    reports that she  has never smoked. She has never used smokeless tobacco. She reports that she uses illicit drugs about twice per week. She reports that she does not drink alcohol. family history includes Arthritis in her father and mother; Diabetes in her other; Heart disease in her brother; Prostate cancer in her brother. Allergies  Allergen Reactions  . Lanoxin [Digoxin] Nausea Only    Nausea and wgt loss   Current Outpatient Prescriptions on File Prior to Visit  Medication Sig Dispense Refill  . Artificial Tear Solution (SOOTHE XP) SOLN Apply 1 drop to eye daily as needed (dry eyes).    . Ascorbic Acid (VITAMIN C) 500 MG tablet Take 500 mg by mouth daily.      Marland Kitchen aspirin 81 MG tablet Take 81 mg by mouth daily.      . Carboxymethylcellul-Glycerin (REFRESH OPTIVE OP) Apply 1 drop to eye at bedtime.    . carvedilol (COREG) 12.5 MG tablet TAKE 1 TABLET TWICE A DAY 180 tablet 2  . diazepam (VALIUM) 5 MG tablet Take 1 tablet (5 mg total) by mouth at bedtime as needed for anxiety. 90 tablet 1  . furosemide (LASIX) 40 MG tablet Take 1 tablet (40 mg total) by mouth daily. 180 tablet 3  . glucose blood (ACCU-CHEK AVIVA) test strip Test two times daily. Code 250.02 100 each 11  . ibandronate (BONIVA) 150 MG tablet TAKE 1 TABLET ONCE A MONTH 3 tablet 3  . ivabradine (CORLANOR) 5 MG TABS  tablet Take 1 tablet (5 mg total) by mouth 2 (two) times daily with a meal. 180 tablet 2  . JANUVIA 100 MG tablet TAKE 1 TABLET DAILY 90 tablet 1  . Lancets MISC Test as directed two times daily. Code 250.02 100 each 11  . losartan (COZAAR) 50 MG tablet Take 1 tablet (50 mg total) by mouth daily. 90 tablet 3  . metFORMIN (GLUCOPHAGE-XR) 500 MG 24 hr tablet Take 1 tablet (500 mg total) by mouth daily with breakfast. 180 tablet 3  . simvastatin (ZOCOR) 20 MG tablet Take 1 tablet (20 mg total) by mouth at bedtime. 90 tablet 3  . spironolactone (ALDACTONE) 25 MG tablet Take 1 tablet (25 mg total) by mouth daily. 90 tablet 2  . Wheat  Dextrin (BENEFIBER) POWD Take by mouth. 2 tablespoons once daily     No current facility-administered medications on file prior to visit.    Review of Systems  Constitutional: Negative for unusual diaphoresis or night sweats HENT: Negative for ringing in ear or discharge Eyes: Negative for double vision or worsening visual disturbance.  Respiratory: Negative for choking and stridor.   Gastrointestinal: Negative for vomiting or other signifcant bowel change Genitourinary: Negative for hematuria or change in urine volume.  Musculoskeletal: Negative for other MSK pain or swelling Skin: Negative for color change and worsening wound.  Neurological: Negative for tremors and numbness other than noted  Psychiatric/Behavioral: Negative for decreased concentration or agitation other than above       Objective:   Physical Exam BP 104/60 mmHg  Pulse 73  Temp(Src) 97.8 F (36.6 C) (Oral)  Ht 5\' 4"  (1.626 m)  Wt 122 lb (55.339 kg)  BMI 20.93 kg/m2  SpO2 98% VS noted,  Constitutional: Pt appears in no significant distress HENT: Head: NCAT.  Right Ear: External ear normal.  Left Ear: External ear normal.  Eyes: . Pupils are equal, round, and reactive to light. Conjunctivae and EOM are normal Bilat tm's with mild erythema.  Max sinus areas non tender.  Pharynx with mild erythema, no exudate Neck: Normal range of motion. Neck supple.  Cardiovascular: Normal rate and regular rhythm.   Pulmonary/Chest: Effort normal and breath sounds without rales or wheezing.  Abd:  Soft, NT, ND, + BS Neurological: Pt is alert. Not confused , motor grossly intact Skin: Skin is warm. No rash, no LE edema Psychiatric: Pt behavior is normal. No agitation.     Assessment & Plan:

## 2015-09-15 NOTE — Addendum Note (Signed)
Addended by: Lyman Bishop on: 09/15/2015 06:12 PM   Modules accepted: Orders

## 2015-09-16 ENCOUNTER — Encounter: Payer: Self-pay | Admitting: Internal Medicine

## 2015-09-16 ENCOUNTER — Other Ambulatory Visit (INDEPENDENT_AMBULATORY_CARE_PROVIDER_SITE_OTHER): Payer: Medicare Other

## 2015-09-16 ENCOUNTER — Other Ambulatory Visit (HOSPITAL_COMMUNITY): Payer: Medicare Other

## 2015-09-16 DIAGNOSIS — E1129 Type 2 diabetes mellitus with other diabetic kidney complication: Secondary | ICD-10-CM

## 2015-09-16 LAB — HEPATIC FUNCTION PANEL
ALBUMIN: 3.9 g/dL (ref 3.5–5.2)
ALT: 8 U/L (ref 0–35)
AST: 16 U/L (ref 0–37)
Alkaline Phosphatase: 60 U/L (ref 39–117)
Bilirubin, Direct: 0.1 mg/dL (ref 0.0–0.3)
Total Bilirubin: 0.3 mg/dL (ref 0.2–1.2)
Total Protein: 7 g/dL (ref 6.0–8.3)

## 2015-09-16 LAB — HEMOGLOBIN A1C: Hgb A1c MFr Bld: 7.1 % — ABNORMAL HIGH (ref 4.6–6.5)

## 2015-09-16 LAB — BASIC METABOLIC PANEL
BUN: 23 mg/dL (ref 6–23)
CALCIUM: 9.5 mg/dL (ref 8.4–10.5)
CO2: 27 mEq/L (ref 19–32)
CREATININE: 1.67 mg/dL — AB (ref 0.40–1.20)
Chloride: 105 mEq/L (ref 96–112)
GFR: 38 mL/min — AB (ref >60.00)
GLUCOSE: 127 mg/dL — AB (ref 70–99)
Potassium: 4.7 mEq/L (ref 3.5–5.1)
SODIUM: 140 meq/L (ref 135–145)

## 2015-09-16 LAB — LIPID PANEL
CHOLESTEROL: 122 mg/dL (ref 0–200)
HDL: 47.7 mg/dL (ref >39.00)
LDL CALC: 54 mg/dL (ref 0–99)
NonHDL: 74.39
TRIGLYCERIDES: 103 mg/dL (ref 0.0–149.0)
Total CHOL/HDL Ratio: 3
VLDL: 20.6 mg/dL (ref 0.0–40.0)

## 2015-09-25 ENCOUNTER — Ambulatory Visit (HOSPITAL_COMMUNITY)
Admission: RE | Admit: 2015-09-25 | Discharge: 2015-09-25 | Disposition: A | Discharge status: Home / Self Care | Payer: Medicare Other | Source: Ambulatory Visit | Attending: Cardiology | Admitting: Cardiology

## 2015-09-25 VITALS — BP 90/52 | HR 76 | Wt 122.1 lb

## 2015-09-25 DIAGNOSIS — Z79899 Other long term (current) drug therapy: Secondary | ICD-10-CM | POA: Diagnosis not present

## 2015-09-25 DIAGNOSIS — I10 Essential (primary) hypertension: Secondary | ICD-10-CM | POA: Diagnosis not present

## 2015-09-25 DIAGNOSIS — E785 Hyperlipidemia, unspecified: Secondary | ICD-10-CM | POA: Diagnosis not present

## 2015-09-25 DIAGNOSIS — N183 Chronic kidney disease, stage 3 unspecified: Secondary | ICD-10-CM

## 2015-09-25 DIAGNOSIS — Z95810 Presence of automatic (implantable) cardiac defibrillator: Secondary | ICD-10-CM | POA: Insufficient documentation

## 2015-09-25 DIAGNOSIS — Z7982 Long term (current) use of aspirin: Secondary | ICD-10-CM | POA: Diagnosis not present

## 2015-09-25 DIAGNOSIS — N2889 Other specified disorders of kidney and ureter: Secondary | ICD-10-CM

## 2015-09-25 DIAGNOSIS — I340 Nonrheumatic mitral (valve) insufficiency: Secondary | ICD-10-CM | POA: Insufficient documentation

## 2015-09-25 DIAGNOSIS — I442 Atrioventricular block, complete: Secondary | ICD-10-CM | POA: Insufficient documentation

## 2015-09-25 DIAGNOSIS — Z8249 Family history of ischemic heart disease and other diseases of the circulatory system: Secondary | ICD-10-CM | POA: Diagnosis not present

## 2015-09-25 DIAGNOSIS — E1122 Type 2 diabetes mellitus with diabetic chronic kidney disease: Secondary | ICD-10-CM | POA: Insufficient documentation

## 2015-09-25 DIAGNOSIS — N189 Chronic kidney disease, unspecified: Secondary | ICD-10-CM | POA: Insufficient documentation

## 2015-09-25 DIAGNOSIS — I5022 Chronic systolic (congestive) heart failure: Secondary | ICD-10-CM

## 2015-09-25 DIAGNOSIS — I428 Other cardiomyopathies: Secondary | ICD-10-CM | POA: Insufficient documentation

## 2015-09-25 DIAGNOSIS — Z7984 Long term (current) use of oral hypoglycemic drugs: Secondary | ICD-10-CM | POA: Insufficient documentation

## 2015-09-25 MED ORDER — FUROSEMIDE 40 MG PO TABS: ORAL_TABLET | ORAL | Status: DC

## 2015-09-25 NOTE — Patient Instructions (Signed)
START alternating doses of Lasix 40 mg daily and 40 mg in the AM and 20 mg in the PM  Labs needed in 1 week  Your physician recommends that you schedule a follow-up appointment in: 2 months  Do the following things EVERYDAY: 1) Weigh yourself in the morning before breakfast. Write it down and keep it in a log. 2) Take your medicines as prescribed 3) Eat low salt foods-Limit salt (sodium) to 2000 mg per day.  4) Stay as active as you can everyday 5) Limit all fluids for the day to less than 2 liters 6)

## 2015-09-25 NOTE — Progress Notes (Signed)
Advanced Heart Failure Clinic Note   Patient ID: Priscilla Anderson, female   DOB: 1934-04-23, 81 y.o.   MRN: [id number] PCP: Dr. Jenny Reichmann Primary Cardiologist: Dr. Sallyanne Kuster HF Cardiologist: Dr. Aundra Dubin  81 yo with long history of chronic systolic CHF from nonischemic cardiomyopathy presents for CHF clinic evaluation.  She had cardiac cath in 2006 with nonobstructive CAD.  Cardiolites have shown anterior fixed defect but no ischemia (12/13, 9/16).  Most recent echo in 9/16 showed EF 20% with moderate RV systolic dysfunction and moderate-severe functional MR.  She has a Medtronic CRT-D device.    Prior to her initial CHF clinic appointment, she had become more short of breath with exertion for several months.  She was still very functional, able to do her ADLs and to grocery shop.  However, she was short of breath with stairs and with heavier exertion like carrying a load.  Generally no problems walking a short distance.  No chest pain.  No orthopnea/PND.  No tachypalpitations.  Of note, she tried to restart digoxin but developed nausea (had this with digoxin in the past) so stopped it.   She returns for regular follow up. At last visit we decreased her lasix due to rise in Cr, f/u BMETs showed improvement. Fluid status mildly worse on decreased lasix, weight up 1.5 lbs since last visit.  Had to go PCP for allergy meds and steroid shot and her "stuffiness" and been much improved.  Gets around stores and does her ADLs without much difficulty. Thinks she feels much better since started on Corlanor. Most strenuous activity is housework. She will get tired, but not SOB.  She is able to vacuum.  No CP. No orthopnea/PND.  She denies dizziness, lightheadedness, or near syncope.  CPX  10/16. Showed a significant CHF limitation.  VE/VCO2 slope was elevated out of proportion to the VO2 max.   Optivol: Fluid index trending up but still below threshold, thoracic impedance has stabilized.  Labs (8/16): TSH normal Labs  (9/16): K 4.5, creatinine 1.69, BNP 985 Labs (10/16): K 4.2, creatinine 2.06, BNP 808 Labs 09/16/15: K 4.7, creatinine 1.67, BNP 960, LDL 54, HDL 48  PMH: 1. CHB: s/p Medtronic CRT-D device.  2. 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  3. CKD 4. Hyperlipidemia 5. Chronic systolic CHF: Nonischemic cardiomyopathy.  LHC (2006) with 40-50% LAD stenosis, 30% RCA stenosis.  Cardiolite (12/13) with no ischemia.  Medtronic CRT-D.  Echo (9/16) with EF 20%, severe LV dilation, restrictive diastolic function, moderate-severe MR (suspect functional), normal RV size with moderately decreased systolic function.  Cardiolite (9/16) with EF 16%, anterior scar (same as prior), no ischemia.  CPX (10/16): peak VO2 12 (72% predicted), VE/VCO2 slope 53, RER 1.12, OUES 0.8 => VE/VCO2 slope suggests severe HR limitation out of proportion to peak VO2.  6. Mitral regurgitation: Moderate to severe on 9/16 echo, likely functional.  [place]: [place], retired, no smoking, 2 children, no ETOH.   FH: 1 brother with CHF, 1 brother with history of MI.   ROS: All systems reviewed and negative except as per HPI.   Current Outpatient Prescriptions  Medication Sig Dispense Refill  . Artificial Tear Solution (SOOTHE XP) SOLN Apply 1 drop to eye daily as needed (dry eyes).    . Ascorbic Acid (VITAMIN C) 500 MG tablet Take 500 mg by mouth daily.      Marland Kitchen aspirin 81 MG tablet Take 81 mg by mouth daily.      . Carboxymethylcellul-Glycerin (  REFRESH OPTIVE OP) Apply 1 drop to eye at bedtime.    . carvedilol (COREG) 12.5 MG tablet TAKE 1 TABLET TWICE A DAY 180 tablet 2  . furosemide (LASIX) 40 MG tablet Take 1 tablet (40 mg total) by mouth daily. 180 tablet 3  . glucose blood (ACCU-CHEK AVIVA) test strip Test two times daily. Code 250.02 100 each 11  . ibandronate (BONIVA) 150 MG tablet TAKE 1 TABLET ONCE A MONTH 3 tablet 3  . ivabradine (CORLANOR) 5 MG TABS tablet Take 1 tablet (5 mg total) by mouth 2  (two) times daily with a meal. 180 tablet 2  . JANUVIA 100 MG tablet TAKE 1 TABLET DAILY 90 tablet 1  . Lancets MISC Test as directed two times daily. Code 250.02 100 each 11  . losartan (COZAAR) 50 MG tablet Take 1 tablet (50 mg total) by mouth daily. 90 tablet 3  . metFORMIN (GLUCOPHAGE-XR) 500 MG 24 hr tablet Take 1 tablet (500 mg total) by mouth daily with breakfast. 180 tablet 3  . simvastatin (ZOCOR) 20 MG tablet Take 1 tablet (20 mg total) by mouth at bedtime. 90 tablet 3  . spironolactone (ALDACTONE) 25 MG tablet Take 1 tablet (25 mg total) by mouth daily. 90 tablet 2  . Wheat Dextrin (BENEFIBER) POWD Take by mouth. 2 tablespoons once daily    . diazepam (VALIUM) 5 MG tablet Take 1 tablet (5 mg total) by mouth at bedtime as needed for anxiety. (Patient not taking: Reported on 09/25/2015) 90 tablet 1  . fexofenadine (ALLEGRA) 180 MG tablet Take 1 tablet (180 mg total) by mouth daily. (Patient not taking: Reported on 09/25/2015) 30 tablet 2  . triamcinolone (NASACORT AQ) 55 MCG/ACT AERO nasal inhaler Place 2 sprays into the nose daily. (Patient not taking: Reported on 09/25/2015) 1 Inhaler 12   No current facility-administered medications for this encounter.   BP 90/52 mmHg  Pulse 76  Wt 122 lb 1.9 oz (55.393 kg)  SpO2 98%   Wt Readings from Last 3 Encounters:  09/25/15 122 lb 1.9 oz (55.393 kg)  09/15/15 122 lb (55.339 kg)  09/01/15 120 lb 12.8 oz (54.795 kg)    General: NAD Neck: JVP 6-7 cm, no thyromegaly or lymphadenopathy  Lungs: Clear to auscultation bilaterally with normal respiratory effort. CV: Nondisplaced PMI.  Heart regular S1/S2, no S3/S4, 2/6 HSM apex.  No edema.  No carotid bruit.  Normal pedal pulses.  Abdomen: Soft, NT, ND, no HSM.  Skin: Intact without lesions or rashes.  Neurologic: Alert and oriented x 3.  Psych: Normal affect. Extremities: No clubbing or cyanosis.  HEENT: Normal.   Assessment/Plan: 1. Chronic systolic CHF: Nonischemic cardiomyopathy. EF  20% on last echo with moderately decreased RV systolic function and moderate to severe functional MR.  Medtronic CRT-D.  CPX recently showed a probable severe functional limitation due to HF.  NYHA class II symptoms, improved after starting Corlanor.   On exam and by Optivol, her volume status is up very slightly.  - Increase Laisx to 40 qam with another 20 mg every other day in the afternoon.  BMET in 1 week.  - Continue Corlanor, this has made a big difference with her symptoms.  - She has not tolerated digoxin due to nausea. - Continue current Coreg, losartan, and spironolactone.  No BP room to titrate these meds or add Bidil.  - Recommended she increased her activity. If unable to find motivation, will refer to Cardiac Rehab at next visit. 2. CKD: BMET  after increase in Lasix.  3. Mitral regurgitation: Moderate to severe MR, appears to be functional.  4. HLD - Stable last check. Continue statin 5. DM2 - A1C up slightly. Per PCP  Follow up in 2 month. BMET next week.  Satira Mccallum Tillery PA-C 09/25/2015  Patient seen with PA, agree with the above note.  Doing much better symptomatically with Corlanor.  She is developed mild volume overload.  I will increase Lasix to 40 qam and 20 every other day in the afternoon.  No BP room for titration of her other meds.  Followup 2 months.   Loralie Champagne 09/26/2015

## 2015-09-29 ENCOUNTER — Encounter: Payer: Self-pay | Admitting: Cardiology

## 2015-09-30 ENCOUNTER — Ambulatory Visit (HOSPITAL_COMMUNITY)
Admission: RE | Admit: 2015-09-30 | Discharge: 2015-09-30 | Disposition: A | Discharge status: Home / Self Care | Payer: Medicare Other | Source: Ambulatory Visit | Attending: Cardiology | Admitting: Cardiology

## 2015-09-30 DIAGNOSIS — I5022 Chronic systolic (congestive) heart failure: Secondary | ICD-10-CM | POA: Insufficient documentation

## 2015-09-30 LAB — BASIC METABOLIC PANEL
ANION GAP: 8 (ref 5–15)
BUN: 37 mg/dL — AB (ref 6–20)
CHLORIDE: 101 mmol/L (ref 101–111)
CO2: 28 mmol/L (ref 22–32)
Calcium: 9.2 mg/dL (ref 8.9–10.3)
Creatinine, Ser: 2.01 mg/dL — ABNORMAL HIGH (ref 0.44–1.00)
GFR calc Af Amer: 26 mL/min — ABNORMAL LOW (ref >60)
GFR, EST NON AFRICAN AMERICAN: 22 mL/min — AB (ref >60)
Glucose, Bld: 170 mg/dL — ABNORMAL HIGH (ref 65–99)
POTASSIUM: 4.7 mmol/L (ref 3.5–5.1)
SODIUM: 137 mmol/L (ref 135–145)

## 2015-10-01 ENCOUNTER — Telehealth (HOSPITAL_COMMUNITY): Payer: Self-pay | Admitting: *Deleted

## 2015-10-01 DIAGNOSIS — I10 Essential (primary) hypertension: Secondary | ICD-10-CM

## 2015-10-01 DIAGNOSIS — I5022 Chronic systolic (congestive) heart failure: Secondary | ICD-10-CM

## 2015-10-01 MED ORDER — FUROSEMIDE 40 MG PO TABS: 40.0000 mg | ORAL_TABLET | Freq: Every day | ORAL | Status: DC

## 2015-10-01 NOTE — Telephone Encounter (Signed)
-----   Message from Larey Dresser, MD sent at 10/01/2015 12:40 AM EST ----- Creatinine up a bit, decrease Lasix back to 40 mg daily and no afternoon dose.

## 2015-10-01 NOTE — Telephone Encounter (Signed)
Notes Recorded by Scarlette Calico, RN on 10/01/2015 at 11:50 AM Spoke w/pt, she is aware, agreeable and verbalizes understanding. She will decrease med and will monitor weight and will call if increases or develops swelling

## 2015-11-01 ENCOUNTER — Other Ambulatory Visit: Payer: Self-pay | Admitting: Cardiovascular Disease

## 2015-11-01 ENCOUNTER — Other Ambulatory Visit: Payer: Self-pay | Admitting: Internal Medicine

## 2015-11-02 NOTE — Telephone Encounter (Signed)
Rx(s) sent to pharmacy electronically.  

## 2015-11-14 ENCOUNTER — Other Ambulatory Visit: Payer: Self-pay | Admitting: Internal Medicine

## 2015-12-03 ENCOUNTER — Encounter (HOSPITAL_COMMUNITY): Payer: Self-pay

## 2015-12-03 ENCOUNTER — Ambulatory Visit (HOSPITAL_COMMUNITY)
Admission: RE | Admit: 2015-12-03 | Discharge: 2015-12-03 | Disposition: A | Discharge status: Home / Self Care | Payer: Medicare Other | Source: Ambulatory Visit | Attending: Cardiology | Admitting: Cardiology

## 2015-12-03 VITALS — BP 102/60 | HR 71 | Wt 121.8 lb

## 2015-12-03 DIAGNOSIS — N183 Chronic kidney disease, stage 3 unspecified: Secondary | ICD-10-CM

## 2015-12-03 DIAGNOSIS — N189 Chronic kidney disease, unspecified: Secondary | ICD-10-CM | POA: Diagnosis not present

## 2015-12-03 DIAGNOSIS — I5022 Chronic systolic (congestive) heart failure: Secondary | ICD-10-CM | POA: Insufficient documentation

## 2015-12-03 DIAGNOSIS — I059 Rheumatic mitral valve disease, unspecified: Secondary | ICD-10-CM | POA: Diagnosis not present

## 2015-12-03 LAB — BASIC METABOLIC PANEL
Anion gap: 11 (ref 5–15)
BUN: 33 mg/dL — AB (ref 6–20)
CALCIUM: 9.8 mg/dL (ref 8.9–10.3)
CO2: 24 mmol/L (ref 22–32)
CREATININE: 1.87 mg/dL — AB (ref 0.44–1.00)
Chloride: 104 mmol/L (ref 101–111)
GFR calc Af Amer: 28 mL/min — ABNORMAL LOW (ref >60)
GFR calc non Af Amer: 24 mL/min — ABNORMAL LOW (ref >60)
GLUCOSE: 132 mg/dL — AB (ref 65–99)
Potassium: 4.6 mmol/L (ref 3.5–5.1)
Sodium: 139 mmol/L (ref 135–145)

## 2015-12-03 LAB — BRAIN NATRIURETIC PEPTIDE: B Natriuretic Peptide: 982.7 pg/mL — ABNORMAL HIGH (ref 0.0–100.0)

## 2015-12-03 NOTE — Patient Instructions (Signed)
Labs today  We will contact you in 3 months to schedule your next appointment.  

## 2015-12-04 NOTE — Progress Notes (Signed)
Patient ID: Priscilla Anderson, female   DOB: 1934-03-21, 82 y.o.   MRN: [id number]   Advanced Heart Failure Clinic Note   Patient ID: Priscilla Anderson, female   DOB: 05-Sep-1934, 82 y.o.   MRN: [id number] PCP: Dr. Jenny Reichmann Primary Cardiologist: Dr. Sallyanne Kuster HF Cardiologist: Dr. Aundra Dubin  82 yo with long history of chronic systolic CHF from nonischemic cardiomyopathy presents for CHF clinic evaluation. She had cardiac cath in 2006 with nonobstructive CAD.  Cardiolites have shown anterior fixed defect but no ischemia (12/13, 9/16).  Most recent echo in 9/16 showed EF 20% with moderate RV systolic dysfunction and moderate-severe functional MR.  She has a Medtronic CRT-D device.    Prior to her initial CHF clinic appointment, she had become more short of breath with exertion for several months.  She was still very functional, able to do her ADLs and to grocery shop.  However, she was short of breath with stairs and with heavier exertion like carrying a load.  Generally no problems walking a short distance.  No chest pain.  No orthopnea/PND.  No tachypalpitations.  Of note, she tried to restart digoxin but developed nausea (had this with digoxin in the past) so stopped it.   She returns for regular follow up.  Gets around stores and does her ADLs without much difficulty. Thinks she feels much better since started on Corlanor. Most strenuous activity is housework. She is able to walk around the grocery store.  She will get tired, but not SOB.  She is able to vacuum.  No CP. No orthopnea/PND.  She denies dizziness, lightheadedness, or near syncope.  SBP running 90s-100s when she checks at home.   CPX  10/16. Showed a significant CHF limitation.  VE/VCO2 slope was elevated out of proportion to the VO2 max.   Optivol: Fluid index < threshold, thoracic impedance stable.  Labs (8/16): TSH normal Labs (9/16): K 4.5, creatinine 1.69, BNP 985 Labs (10/16): K 4.2, creatinine 2.06, BNP 808 Labs (09/16/15): K 4.7, creatinine  1.67 => 2, BNP 960, LDL 54, HDL 48  PMH: 1. CHB: s/p Medtronic CRT-D device.  2. 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  3. CKD 4. Hyperlipidemia 5. Chronic systolic CHF: Nonischemic cardiomyopathy.  LHC (2006) with 40-50% LAD stenosis, 30% RCA stenosis.  Cardiolite (12/13) with no ischemia.  Medtronic CRT-D.  Echo (9/16) with EF 20%, severe LV dilation, restrictive diastolic function, moderate-severe MR (suspect functional), normal RV size with moderately decreased systolic function.  Cardiolite (9/16) with EF 16%, anterior scar (same as prior), no ischemia.  CPX (10/16): peak VO2 12 (72% predicted), VE/VCO2 slope 53, RER 1.12, OUES 0.8 => VE/VCO2 slope suggests severe HR limitation out of proportion to peak VO2.  6. Mitral regurgitation: Moderate to severe on 9/16 echo, likely functional.  SH: Lives in [place], widow, retired, no smoking, 2 children, no ETOH.   FH: 1 brother with CHF, 1 brother with history of MI.   ROS: All systems reviewed and negative except as per HPI.   Current Outpatient Prescriptions  Medication Sig Dispense Refill  . Artificial Tear Solution (SOOTHE XP) SOLN Apply 1 drop to eye daily as needed (dry eyes).    . Ascorbic Acid (VITAMIN C) 500 MG tablet Take 500 mg by mouth daily.      Marland Kitchen aspirin 81 MG tablet Take 81 mg by mouth daily.      . Carboxymethylcellul-Glycerin (REFRESH OPTIVE OP) Apply 1 drop to eye at bedtime.    Marland Kitchen  carvedilol (COREG) 12.5 MG tablet TAKE 1 TABLET TWICE A DAY 180 tablet 2  . diazepam (VALIUM) 5 MG tablet Take 1 tablet (5 mg total) by mouth at bedtime as needed for anxiety. 90 tablet 1  . furosemide (LASIX) 40 MG tablet Take 1 tablet (40 mg total) by mouth daily. 180 tablet 3  . glucose blood (ACCU-CHEK AVIVA) test strip Test two times daily. Code 250.02 100 each 11  . ibandronate (BONIVA) 150 MG tablet TAKE 1 TABLET ONCE A MONTH 3 tablet 3  . ivabradine (CORLANOR) 5 MG TABS tablet Take 1 tablet (5 mg total) by mouth 2  (two) times daily with a meal. 180 tablet 2  . JANUVIA 100 MG tablet TAKE 1 TABLET DAILY 90 tablet 0  . Lancets MISC Test as directed two times daily. Code 250.02 100 each 11  . losartan (COZAAR) 50 MG tablet TAKE 1 TABLET DAILY 90 tablet 1  . metFORMIN (GLUCOPHAGE-XR) 500 MG 24 hr tablet Take 1 tablet (500 mg total) by mouth daily with breakfast. 180 tablet 3  . simvastatin (ZOCOR) 20 MG tablet Take 1 tablet (20 mg total) by mouth at bedtime. 90 tablet 3  . spironolactone (ALDACTONE) 25 MG tablet Take 1 tablet (25 mg total) by mouth daily. 90 tablet 2  . Wheat Dextrin (BENEFIBER) POWD Take by mouth. 2 tablespoons once daily     No current facility-administered medications for this encounter.   BP 102/60 mmHg  Pulse 71  Wt 121 lb 12 oz (55.225 kg)  SpO2 98%   Wt Readings from Last 3 Encounters:  12/03/15 121 lb 12 oz (55.225 kg)  09/25/15 122 lb 1.9 oz (55.393 kg)  09/15/15 122 lb (55.339 kg)    General: NAD Neck: JVP 7 cm, no thyromegaly or lymphadenopathy  Lungs: Clear to auscultation bilaterally with normal respiratory effort. CV: Nondisplaced PMI.  Heart regular S1/S2, no S3/S4, 2/6 HSM apex.  No edema.  No carotid bruit.  Normal pedal pulses.  Abdomen: Soft, NT, ND, no HSM.  Skin: Intact without lesions or rashes.  Neurologic: Alert and oriented x 3.  Psych: Normal affect. Extremities: No clubbing or cyanosis.  HEENT: Normal.   Assessment/Plan: 1. Chronic systolic CHF: Nonischemic cardiomyopathy. EF 20% on last echo with moderately decreased RV systolic function and moderate to severe functional MR.  Medtronic CRT-D.  CPX showed a probable severe functional limitation due to HF.  NYHA class II symptoms, improved after starting Corlanor.   On exam and by Optivol, she is not volume overloaded.  - Continue Lasix 40 mg daily.  Check BMET/BNP today.  - Continue Corlanor, this has made a big difference with her symptoms.  - She has not tolerated digoxin due to nausea. - Continue  current Coreg, losartan, and spironolactone.  No BP room to titrate these meds or add Bidil.  2. CKD: BMET today.   3. Mitral regurgitation: Moderate to severe MR, appears to be functional.  4. HLD: Stable last check. Continue statin  Loralie Champagne 12/04/2015

## 2015-12-14 ENCOUNTER — Encounter: Payer: Self-pay | Admitting: Cardiology

## 2015-12-15 ENCOUNTER — Ambulatory Visit (INDEPENDENT_AMBULATORY_CARE_PROVIDER_SITE_OTHER): Payer: Medicare Other | Admitting: Cardiovascular Disease

## 2015-12-15 ENCOUNTER — Encounter: Payer: Self-pay | Admitting: Cardiovascular Disease

## 2015-12-15 VITALS — BP 100/62 | HR 74 | Ht 64.5 in | Wt 124.5 lb

## 2015-12-15 DIAGNOSIS — N183 Chronic kidney disease, stage 3 unspecified: Secondary | ICD-10-CM

## 2015-12-15 DIAGNOSIS — N189 Chronic kidney disease, unspecified: Secondary | ICD-10-CM

## 2015-12-15 DIAGNOSIS — I429 Cardiomyopathy, unspecified: Secondary | ICD-10-CM | POA: Diagnosis not present

## 2015-12-15 DIAGNOSIS — Z95810 Presence of automatic (implantable) cardiac defibrillator: Secondary | ICD-10-CM

## 2015-12-15 DIAGNOSIS — I472 Ventricular tachycardia, unspecified: Secondary | ICD-10-CM

## 2015-12-15 DIAGNOSIS — I059 Rheumatic mitral valve disease, unspecified: Secondary | ICD-10-CM | POA: Diagnosis not present

## 2015-12-15 DIAGNOSIS — I420 Dilated cardiomyopathy: Secondary | ICD-10-CM

## 2015-12-15 DIAGNOSIS — E1129 Type 2 diabetes mellitus with other diabetic kidney complication: Secondary | ICD-10-CM

## 2015-12-15 DIAGNOSIS — I5022 Chronic systolic (congestive) heart failure: Secondary | ICD-10-CM

## 2015-12-15 DIAGNOSIS — E785 Hyperlipidemia, unspecified: Secondary | ICD-10-CM

## 2015-12-15 DIAGNOSIS — N2889 Other specified disorders of kidney and ureter: Secondary | ICD-10-CM

## 2015-12-15 NOTE — Patient Instructions (Signed)
Remote monitoring is used to monitor your ICD from home. This monitoring reduces the number of office visits required to check your device to one time per year. It allows [place] to monitor the functioning of your device to ensure it is working properly. You are scheduled for a device check from home on Mar 14, 2016. You may send your transmission at any time that day. If you have a wireless device, the transmission will be sent automatically. After your physician reviews your transmission, you will receive a postcard with your next transmission date.  Dr. Sallyanne Kuster recommends that you schedule a follow-up appointment in: 6 MONTHS

## 2015-12-15 NOTE — Progress Notes (Signed)
Patient ID: Priscilla Anderson, female   DOB: 13-Jul-1934, 82 y.o.   MRN: [id number]    Cardiology Office Note    Date:  12/16/2015   ID:  Vail, Basista 08/12/1934, MRN [id number]  PCP:  Cathlean Cower, MD  Cardiologist:  Sanda Klein, MD   Chief Complaint  Patient presents with  . Follow-up    no chest pain, no shortness of breath, no edema, no pain or cramping in legs, no lightheadedness or dizziness    History of Present Illness:  Priscilla Anderson is a 82 y.o. female with long-standing nonischemic dilated cardiomyopathy (EF 20% Sep 2016) and severely depressed left ventricular systolic function, moderate-severe mitral regurgitation, mild and nonobstructive CAD  and a biventricular pacemaker/defibrillator who returns in follow-up. She feels better since starting treatment with [facility] and sees Dr. Aundra Dubin in the heart failure clinic. Her steady and alarming weight loss has stopped. She thinks it was due to the fact that she was excessively strict with her diet for diabetes.    in eyes angina, edema, syncope, weakness/dizziness, palpitations, focal neurological events. She performs her own housework without much limitation.   Device interrogation shows normal by the ICD function with an estimated generator longevity of 4.5 years. Lead parameters are favorable and stable. Activity level is about 1.1 hours per day and also stable. Optive all does not show evidence of fluid overload. She has 98% biventricular pacing. She has not had any atrial tachycardia or atrial fibrillation. She does also have 42% atrial pacing. No episodes of VT/VF have been recorded in the formal detection window set on the device, but a review of her "ventricular sensed" electrograms shows numerous episodes of relatively slow ventricular tachycardia with a cycle length of around 400 ms (rate 150 bpm). A new VT monitor zone was programmed on her device for this reason today.  Past Medical History  Diagnosis Date  .  Nonischemic cardiomyopathy ([place]) 09/22/2011  . CHF (congestive heart failure) ([place])   . Hyperlipidemia   . Hypertension   . Osteopenia   . Adenomatous colon polyp   . Polyp of larynx   . CAD (coronary artery disease)   . AICD (automatic cardioverter/defibrillator) present   . Type II diabetes mellitus [facility])     Past Surgical History  Procedure Laterality Date  . Cardiac defibrillator placement  05/21/2002    s/p re-do pacemaker/defibrillator October 2011- [place].  . Colonoscopy    . Implantable cardioverter defibrillator generator change  09/01/2010    [employer]  . Biv icd genertaor change out N/A 07/29/2014    Procedure: BIV ICD GENERTAOR CHANGE OUT;  Surgeon: Sanda Klein, MD;  Location: [facility] CATH LAB;  Service: Cardiovascular;  Laterality: N/A;  . Vaginal hysterectomy    . Breast cyst excision Left 1980  . Cardiac catheterization  07/28/2005    noncritical CAD  . Cardiac catheterization      "I've had several"  . Microlaryngoscopy with co2 laser and excision of vocal cord lesion  08/2002    /notes 03/29/2011    Outpatient Prescriptions Prior to Visit  Medication Sig Dispense Refill  . Artificial Tear Solution (SOOTHE XP) SOLN Apply 1 drop to eye daily as needed (dry eyes).    . Ascorbic Acid (VITAMIN C) 500 MG tablet Take 500 mg by mouth daily.      Marland Kitchen aspirin 81 MG tablet Take 81 mg by mouth daily.      . Carboxymethylcellul-Glycerin (REFRESH OPTIVE OP) Apply 1 drop to eye  at bedtime.    . carvedilol (COREG) 12.5 MG tablet TAKE 1 TABLET TWICE A DAY 180 tablet 2  . diazepam (VALIUM) 5 MG tablet Take 1 tablet (5 mg total) by mouth at bedtime as needed for anxiety. 90 tablet 1  . furosemide (LASIX) 40 MG tablet Take 1 tablet (40 mg total) by mouth daily. 180 tablet 3  . glucose blood (ACCU-CHEK AVIVA) test strip Test two times daily. Code 250.02 100 each 11  . ibandronate (BONIVA) 150 MG tablet TAKE 1 TABLET ONCE A MONTH 3 tablet 3  . ivabradine (CORLANOR) 5 MG TABS  tablet Take 1 tablet (5 mg total) by mouth 2 (two) times daily with a meal. 180 tablet 2  . JANUVIA 100 MG tablet TAKE 1 TABLET DAILY 90 tablet 0  . Lancets MISC Test as directed two times daily. Code 250.02 100 each 11  . losartan (COZAAR) 50 MG tablet TAKE 1 TABLET DAILY 90 tablet 1  . metFORMIN (GLUCOPHAGE-XR) 500 MG 24 hr tablet Take 1 tablet (500 mg total) by mouth daily with breakfast. 180 tablet 3  . simvastatin (ZOCOR) 20 MG tablet Take 1 tablet (20 mg total) by mouth at bedtime. 90 tablet 3  . spironolactone (ALDACTONE) 25 MG tablet Take 1 tablet (25 mg total) by mouth daily. 90 tablet 2  . Wheat Dextrin (BENEFIBER) POWD Take by mouth. 2 tablespoons once daily     No facility-administered medications prior to visit.     Allergies:   Lanoxin   Social History   Social History  . Marital Status: Widowed    Spouse Name: N/A  . Number of Children: 2  . Years of Education: N/A   Occupational History  . retired Investment banker, corporate. report clerk    Social History Main Topics  . Smoking status: Never Smoker   . Smokeless tobacco: Never Used  . Alcohol Use: No  . Drug Use: 2.00 per week  . Sexual Activity: Not Asked   Other Topics Concern  . None   Social History Narrative   Husband now with Multiple Myeloma- see Dr. Juanita Craver     Family History:  The patient's family history includes Arthritis in her father and mother; Diabetes in her other; Heart disease in her brother; Prostate cancer in her brother.   ROS:   Please see the history of present illness.    ROS All other systems reviewed and are negative.   PHYSICAL EXAM:   VS:  BP 100/62 mmHg  Pulse 74  Ht 5' 4.5" (1.638 m)  Wt 56.473 kg (124 lb 8 oz)  BMI 21.05 kg/m2   GEN: Well nourished, well developed, in no acute distress HEENT: normal Neck: no JVD, carotid bruits, or masses Cardiac: Laterally displaced apical impulse, paradoxically split S2, RRR; grade 1/6 holosystolic apical murmur no diastolic  murmurs, rubs, or gallops,no edema  Respiratory:  clear to auscultation bilaterally, normal work of breathing GI: soft, nontender, nondistended, + BS MS: no deformity or atrophy Skin: warm and dry, no rash Neuro:  Alert and Oriented x 3, Strength and sensation are intact Psych: euthymic mood, full affect  Wt Readings from Last 3 Encounters:  12/15/15 56.473 kg (124 lb 8 oz)  12/03/15 55.225 kg (121 lb 12 oz)  09/25/15 55.393 kg (122 lb 1.9 oz)      Studies/Labs Reviewed:   EKG:  EKG is not ordered today.  The intracardiac recording today demonstrates atrial sensed-biventricular paced rhythm  Recent Labs: 07/09/2015: Hemoglobin 12.1; Platelets  195.0; TSH 1.52 09/16/2015: ALT 8 12/03/2015: B Natriuretic Peptide 982.7*; BUN 33*; Creatinine, Ser 1.87*; Potassium 4.6; Sodium 139   Lipid Panel    Component Value Date/Time   CHOL 122 09/16/2015 0748   TRIG 103.0 09/16/2015 0748   HDL 47.70 09/16/2015 0748   CHOLHDL 3 09/16/2015 0748   VLDL 20.6 09/16/2015 0748   LDLCALC 54 09/16/2015 0748    Additional studies/ records that were reviewed today include:  Heart Failure clinic notes    ASSESSMENT:    1. Chronic systolic CHF (congestive heart failure) ([place])   2. Nonischemic dilated cardiomyopathy ([place])   3. Mitral valve disorder   4. Biventricular ICD (implantable cardioverter-defibrillator) in place   5. Chronic renal insufficiency, stage III (moderate)   6. Hyperlipidemia   7. Type 2 diabetes mellitus with renal manifestations not at goal [facility])   8. Ventricular tachycardia ([place])      PLAN:  In order of problems listed above:  1. CHF: Clinically euvolemic, confirmed by normal optivol. BNP in January not much different from October, although symptoms are now better NYHA functional class I-II. Subjective benefit from Ivabradine. No changes made to medications or diuretic prescription she is on comprehensive medical therapy with a maximum tolerated dose of beta blocker,  angiotensin receptor blocker, spironolactone (limited by blood pressure) as well as low dose loop diuretic. 2. CMP: Was recent EF 20% by echo, 17% by scintigraphy. Moderate nonobstructive coronary artery disease by previous cath. Anterior scar without ischemia on nuclear study recently 3. MR: Suspected to be secondary to cardiomyopathy, severity waxes and wanes with heart failur 4. CRT-D: She has never required treatment from her device. BiV pacing the efficiency is 98%. No changes made to tachycardia therapy or pacing settings.New VT monitor zone added.CareLink download every 3 months and office visit in 6 months. 5. CKD stage 3-4: Monitor frequently  6. HLP: Excellent recent lipid profile  7. DM: Improve glycemic control, most recent hemoglobin A1c almost at goal 7.1%  8. VT: The exact frequency and burden of ventricular tachycardia is uncertain. May represent as much of 2% beats and explain the ventricular sensed events. VT monitor zone added today to help better quantify this. As far as I can tell the VT is not causing symptoms. No antiarrhythmic therapy recommended yet, but may be necessary if there is substantial deterioration in the percentage of biventricular pacing.     Medication Adjustments/Labs and Tests Ordered: Current medicines are reviewed at length with the patient today.  Concerns regarding medicines are outlined above.  Medication changes, Labs and Tests ordered today are listed in the Patient Instructions below. Patient Instructions  Remote monitoring is used to monitor your ICD from home. This monitoring reduces the number of office visits required to check your device to one time per year. It allows [place] to monitor the functioning of your device to ensure it is working properly. You are scheduled for a device check from home on Mar 14, 2016. You may send your transmission at any time that day. If you have a wireless device, the transmission will be sent automatically. After your  physician reviews your transmission, you will receive a postcard with your next transmission date.  Dr. Sallyanne Kuster recommends that you schedule a follow-up appointment in: [place], Hilmar Moldovan, MD  12/16/2015 1:06 PM    [facility] [place], [place], [place]  [address] Phone: [phone]; Fax: [phone]

## 2015-12-16 DIAGNOSIS — I472 Ventricular tachycardia, unspecified: Secondary | ICD-10-CM | POA: Insufficient documentation

## 2015-12-17 ENCOUNTER — Telehealth: Payer: Self-pay

## 2015-12-17 NOTE — Telephone Encounter (Signed)
Form for diabetic supplies was filled out signed and faxed to pharmacy, original copy was put up front for patient to pick back up.

## 2015-12-26 ENCOUNTER — Other Ambulatory Visit: Payer: Self-pay | Admitting: Cardiovascular Disease

## 2016-01-07 ENCOUNTER — Ambulatory Visit (INDEPENDENT_AMBULATORY_CARE_PROVIDER_SITE_OTHER): Payer: Medicare Other | Admitting: Internal Medicine

## 2016-01-07 ENCOUNTER — Other Ambulatory Visit (INDEPENDENT_AMBULATORY_CARE_PROVIDER_SITE_OTHER): Payer: Medicare Other

## 2016-01-07 ENCOUNTER — Encounter: Payer: Self-pay | Admitting: Internal Medicine

## 2016-01-07 VITALS — BP 112/70 | HR 80 | Resp 20 | Wt 122.0 lb

## 2016-01-07 DIAGNOSIS — R1030 Lower abdominal pain, unspecified: Secondary | ICD-10-CM

## 2016-01-07 DIAGNOSIS — I10 Essential (primary) hypertension: Secondary | ICD-10-CM | POA: Diagnosis not present

## 2016-01-07 DIAGNOSIS — E1129 Type 2 diabetes mellitus with other diabetic kidney complication: Secondary | ICD-10-CM

## 2016-01-07 DIAGNOSIS — R109 Unspecified abdominal pain: Secondary | ICD-10-CM | POA: Diagnosis not present

## 2016-01-07 LAB — URINALYSIS, ROUTINE W REFLEX MICROSCOPIC
BILIRUBIN URINE: NEGATIVE
KETONES UR: NEGATIVE
NITRITE: NEGATIVE
PH: 6 (ref 5.0–8.0)
RBC / HPF: NONE SEEN (ref 0–?)
Specific Gravity, Urine: 1.01 (ref 1.000–1.030)
TOTAL PROTEIN, URINE-UPE24: NEGATIVE
Urine Glucose: NEGATIVE
Urobilinogen, UA: 0.2 (ref 0.0–1.0)

## 2016-01-07 LAB — LIPID PANEL
CHOLESTEROL: 135 mg/dL (ref 0–200)
HDL: 52.9 mg/dL (ref >39.00)
LDL CALC: 56 mg/dL (ref 0–99)
NonHDL: 82.26
Total CHOL/HDL Ratio: 3
Triglycerides: 130 mg/dL (ref 0.0–149.0)
VLDL: 26 mg/dL (ref 0.0–40.0)

## 2016-01-07 LAB — BASIC METABOLIC PANEL
BUN: 27 mg/dL — AB (ref 6–23)
CALCIUM: 10.4 mg/dL (ref 8.4–10.5)
CO2: 30 meq/L (ref 19–32)
Chloride: 101 mEq/L (ref 96–112)
Creatinine, Ser: 1.92 mg/dL — ABNORMAL HIGH (ref 0.40–1.20)
GFR: 32.32 mL/min — ABNORMAL LOW (ref >60.00)
GLUCOSE: 103 mg/dL — AB (ref 70–99)
Potassium: 4.6 mEq/L (ref 3.5–5.1)
Sodium: 139 mEq/L (ref 135–145)

## 2016-01-07 LAB — CBC WITH DIFFERENTIAL/PLATELET
Basophils Absolute: 0 10*3/uL (ref 0.0–0.1)
Basophils Relative: 0.2 % (ref 0.0–3.0)
Eosinophils Absolute: 0.2 10*3/uL (ref 0.0–0.7)
Eosinophils Relative: 2.6 % (ref 0.0–5.0)
HCT: 41 % (ref 36.0–46.0)
HEMOGLOBIN: 13.4 g/dL (ref 12.0–15.0)
LYMPHS ABS: 1.1 10*3/uL (ref 0.7–4.0)
LYMPHS PCT: 15.2 % (ref 12.0–46.0)
MCHC: 32.7 g/dL (ref 30.0–36.0)
MCV: 97.1 fl (ref 78.0–100.0)
MONOS PCT: 5 % (ref 3.0–12.0)
Monocytes Absolute: 0.4 10*3/uL (ref 0.1–1.0)
NEUTROS PCT: 77 % (ref 43.0–77.0)
Neutro Abs: 5.6 10*3/uL (ref 1.4–7.7)
Platelets: 248 10*3/uL (ref 150.0–400.0)
RBC: 4.23 Mil/uL (ref 3.87–5.11)
RDW: 13.7 % (ref 11.5–15.5)
WBC: 7.3 10*3/uL (ref 4.0–10.5)

## 2016-01-07 LAB — HEMOGLOBIN A1C: HEMOGLOBIN A1C: 7 % — AB (ref 4.6–6.5)

## 2016-01-07 LAB — HEPATIC FUNCTION PANEL
ALBUMIN: 4.7 g/dL (ref 3.5–5.2)
ALT: 8 U/L (ref 0–35)
AST: 17 U/L (ref 0–37)
Alkaline Phosphatase: 56 U/L (ref 39–117)
Bilirubin, Direct: 0.1 mg/dL (ref 0.0–0.3)
TOTAL PROTEIN: 8.1 g/dL (ref 6.0–8.3)
Total Bilirubin: 0.3 mg/dL (ref 0.2–1.2)

## 2016-01-07 LAB — LIPASE: Lipase: 77 U/L — ABNORMAL HIGH (ref 11.0–59.0)

## 2016-01-07 MED ORDER — DIAZEPAM 5 MG PO TABS
5.0000 mg | ORAL_TABLET | Freq: Every evening | ORAL | Status: DC | PRN
Start: 2016-01-07 — End: 2016-01-20

## 2016-01-07 NOTE — Patient Instructions (Signed)
Please continue all other medications as before, and refills have been done if requested - the diazapem  Please have the pharmacy call with any other refills you may need.  Please continue your efforts at being more active, low cholesterol diet, and weight control.  Please keep your appointments with your specialists as you may have planned  Please go to the LAB in the Basement (turn left off the elevator) for the tests to be done today  You will be contacted by phone if any changes need to be made immediately.  Otherwise, you will receive a letter about your results with an explanation, but please check with MyChart first.  Please remember to sign up for MyChart if you have not done so, as this will be important to you in the future with finding out test results, communicating by private email, and scheduling acute appointments online when needed.  Please return in 6 months, or sooner if needed

## 2016-01-07 NOTE — Assessment & Plan Note (Signed)
stable overall by history and exam, recent data reviewed with pt, and pt to continue medical treatment as before,  to f/u any worsening symptoms or concerns BP Readings from Last 3 Encounters:  01/07/16 112/70  12/15/15 100/62  12/03/15 102/60

## 2016-01-07 NOTE — Progress Notes (Signed)
Pre visit review using our clinic review tool, if applicable. No additional management support is needed unless otherwise documented below in the visit note. 

## 2016-01-07 NOTE — Assessment & Plan Note (Signed)
Etiology unclear, cant r/o uti - for urine studies with labs today

## 2016-01-07 NOTE — Assessment & Plan Note (Signed)
stable overall by history and exam, recent data reviewed with pt, and pt to continue medical treatment as before,  to f/u any worsening symptoms or concerns Lab Results  Component Value Date   HGBA1C 7.1* 09/16/2015   For f/u a1c

## 2016-01-07 NOTE — Progress Notes (Signed)
Subjective:    Patient ID: Priscilla Anderson, female    DOB: Nov 08, 1934, 82 y.o.   MRN: [id number]  HPI  Here with abd pain since wed feb 15, mid to lower mid abd, similar to pain in the past, intermittent didn't hurt at all last tues, mild to mod, not worse with food but just feels irriated, subsisting on fluids and potato soup.   No radiation, no n/v, Denies worsening reflux, dysphagia, or blood.  Did have some looser stools at onset, benefiber seemed to help.  No fever.  Denies urinary symptoms such as dysuria, frequency, urgency, flank pain, hematuria or n/v, fever, chills, but has had asympt UTI several times in the past.  Urine not malodorous as it was before.  Appetite ok, no confused. Has had some lightheaded, but not weak or trying to fall down.  Denies worsening depressive symptoms, suicidal ideation, or panic; has ongoing anxiety, not increased recently, but ongoing and asks for diazepam qhs daily prn refill.  Past Medical History  Diagnosis Date  . Nonischemic cardiomyopathy ([place]) 09/22/2011  . CHF (congestive heart failure) ([place])   . Hyperlipidemia   . Hypertension   . Osteopenia   . Adenomatous colon polyp   . Polyp of larynx   . CAD (coronary artery disease)   . AICD (automatic cardioverter/defibrillator) present   . Type II diabetes mellitus [facility])    Past Surgical History  Procedure Laterality Date  . Cardiac defibrillator placement  05/21/2002    s/p re-do pacemaker/defibrillator October 2011- [place].  . Colonoscopy    . Implantable cardioverter defibrillator generator change  09/01/2010    [employer]  . Biv icd genertaor change out N/A 07/29/2014    Procedure: BIV ICD GENERTAOR CHANGE OUT;  Surgeon: Sanda Klein, MD;  Location: [facility] CATH LAB;  Service: Cardiovascular;  Laterality: N/A;  . Vaginal hysterectomy    . Breast cyst excision Left 1980  . Cardiac catheterization  07/28/2005    noncritical CAD  . Cardiac catheterization      "I've had several"  .  Microlaryngoscopy with co2 laser and excision of vocal cord lesion  08/2002    /notes 03/29/2011    reports that she has never smoked. She has never used smokeless tobacco. She reports that she uses illicit drugs about twice per week. She reports that she does not drink alcohol. family history includes Arthritis in her father and mother; Diabetes in her other; Heart disease in her brother; Prostate cancer in her brother. Allergies  Allergen Reactions  . Lanoxin [Digoxin] Nausea Only    Nausea and wgt loss   Current Outpatient Prescriptions on File Prior to Visit  Medication Sig Dispense Refill  . Artificial Tear Solution (SOOTHE XP) SOLN Apply 1 drop to eye daily as needed (dry eyes).    . Ascorbic Acid (VITAMIN C) 500 MG tablet Take 500 mg by mouth daily.      Marland Kitchen aspirin 81 MG tablet Take 81 mg by mouth daily.      . Carboxymethylcellul-Glycerin (REFRESH OPTIVE OP) Apply 1 drop to eye at bedtime.    . carvedilol (COREG) 12.5 MG tablet TAKE 1 TABLET TWICE A DAY 180 tablet 2  . diazepam (VALIUM) 5 MG tablet Take 1 tablet (5 mg total) by mouth at bedtime as needed for anxiety. 90 tablet 1  . furosemide (LASIX) 40 MG tablet Take 1 tablet (40 mg total) by mouth daily. 180 tablet 3  . glucose blood (ACCU-CHEK AVIVA) test strip Test  two times daily. Code 250.02 100 each 11  . ibandronate (BONIVA) 150 MG tablet TAKE 1 TABLET ONCE A MONTH 3 tablet 3  . ivabradine (CORLANOR) 5 MG TABS tablet Take 1 tablet (5 mg total) by mouth 2 (two) times daily with a meal. 180 tablet 2  . JANUVIA 100 MG tablet TAKE 1 TABLET DAILY 90 tablet 0  . Lancets MISC Test as directed two times daily. Code 250.02 100 each 11  . losartan (COZAAR) 50 MG tablet TAKE 1 TABLET DAILY 90 tablet 1  . metFORMIN (GLUCOPHAGE-XR) 500 MG 24 hr tablet Take 1 tablet (500 mg total) by mouth daily with breakfast. 180 tablet 3  . simvastatin (ZOCOR) 20 MG tablet TAKE 1 TABLET AT BEDTIME 90 tablet 1  . spironolactone (ALDACTONE) 25 MG tablet  Take 1 tablet (25 mg total) by mouth daily. 90 tablet 2  . Wheat Dextrin (BENEFIBER) POWD Take by mouth. 2 tablespoons once daily     No current facility-administered medications on file prior to visit.       Review of Systems  Constitutional: Negative for unusual diaphoresis or night sweats HENT: Negative for ringing in ear or discharge Eyes: Negative for double vision or worsening visual disturbance.  Respiratory: Negative for choking and stridor.   Gastrointestinal: Negative for vomiting or other signifcant bowel change Genitourinary: Negative for hematuria or change in urine volume.  Musculoskeletal: Negative for other MSK pain or swelling Skin: Negative for color change and worsening wound.  Neurological: Negative for tremors and numbness other than noted  Psychiatric/Behavioral: Negative for decreased concentration or agitation other than above       Objective:   Physical Exam BP 112/70 mmHg  Pulse 80  Resp 20  Wt 122 lb (55.339 kg)  SpO2 97% VS noted, not ill appearing Constitutional: Pt appears in no significant distress HENT: Head: NCAT.  Right Ear: External ear normal.  Left Ear: External ear normal.  Eyes: . Pupils are equal, round, and reactive to light. Conjunctivae and EOM are normal Neck: Normal range of motion. Neck supple.  Cardiovascular: Normal rate and regular rhythm.   Pulmonary/Chest: Effort normal and breath sounds without rales or wheezing.  Abd:  Soft, NT, ND, + BS - benign exam, no flank tender Neurological: Pt is alert. Not confused , motor grossly intact Skin: Skin is warm. No rash, no LE edema Psychiatric: Pt behavior is normal. No agitation.      Assessment & Plan:

## 2016-01-07 NOTE — Addendum Note (Signed)
Addended by: Biagio Borg on: 01/07/2016 03:08 PM   Modules accepted: Orders

## 2016-01-08 ENCOUNTER — Encounter: Payer: Self-pay | Admitting: Internal Medicine

## 2016-01-08 ENCOUNTER — Ambulatory Visit: Payer: Medicare Other | Admitting: Internal Medicine

## 2016-01-09 ENCOUNTER — Other Ambulatory Visit: Payer: Self-pay | Admitting: Internal Medicine

## 2016-01-09 LAB — URINE CULTURE

## 2016-01-09 MED ORDER — CEPHALEXIN 500 MG PO CAPS: 500.0000 mg | ORAL_CAPSULE | Freq: Four times a day (QID) | ORAL | Status: DC

## 2016-01-20 ENCOUNTER — Telehealth: Payer: Self-pay

## 2016-01-20 MED ORDER — DIAZEPAM 5 MG PO TABS: 5.0000 mg | ORAL_TABLET | Freq: Every evening | ORAL | Status: DC | PRN

## 2016-01-20 NOTE — Telephone Encounter (Signed)
Done hardcopy to corinne 

## 2016-01-20 NOTE — Telephone Encounter (Signed)
Pt lm on triage. Pt stated that [facility] did not receive the rx for diazepam.   Can this be resent or called in?

## 2016-01-29 ENCOUNTER — Other Ambulatory Visit: Payer: Self-pay | Admitting: Internal Medicine

## 2016-02-02 ENCOUNTER — Telehealth: Payer: Self-pay

## 2016-02-02 MED ORDER — DIAZEPAM 5 MG PO TABS: 5.0000 mg | ORAL_TABLET | Freq: Every evening | ORAL | Status: DC | PRN

## 2016-02-02 NOTE — Telephone Encounter (Signed)
Recd faxed [facility] for diazepam 5mg  tab----please advise, thanks

## 2016-02-02 NOTE — Telephone Encounter (Signed)
Medication sent to pharmacy  

## 2016-02-02 NOTE — Telephone Encounter (Signed)
Done Done hardcopy to Corinne  

## 2016-02-02 NOTE — Addendum Note (Signed)
Addended by: Biagio Borg on: 02/02/2016 01:25 PM   Modules accepted: Orders

## 2016-02-09 ENCOUNTER — Ambulatory Visit (INDEPENDENT_AMBULATORY_CARE_PROVIDER_SITE_OTHER): Payer: Medicare Other | Admitting: Internal Medicine

## 2016-02-09 ENCOUNTER — Encounter: Payer: Self-pay | Admitting: Internal Medicine

## 2016-02-09 ENCOUNTER — Telehealth: Payer: Self-pay

## 2016-02-09 VITALS — BP 128/70 | HR 98 | Temp 98.5°F | Resp 20 | Wt 121.0 lb

## 2016-02-09 DIAGNOSIS — I10 Essential (primary) hypertension: Secondary | ICD-10-CM

## 2016-02-09 DIAGNOSIS — I5022 Chronic systolic (congestive) heart failure: Secondary | ICD-10-CM | POA: Diagnosis not present

## 2016-02-09 DIAGNOSIS — J069 Acute upper respiratory infection, unspecified: Secondary | ICD-10-CM | POA: Diagnosis not present

## 2016-02-09 MED ORDER — HYDROCODONE-HOMATROPINE 5-1.5 MG/5ML PO SYRP: 5.0000 mL | ORAL_SOLUTION | Freq: Four times a day (QID) | ORAL | Status: DC | PRN

## 2016-02-09 MED ORDER — AZITHROMYCIN 250 MG PO TABS: ORAL_TABLET | ORAL | Status: DC

## 2016-02-09 NOTE — Progress Notes (Signed)
Pre visit review using our clinic review tool, if applicable. No additional management support is needed unless otherwise documented below in the visit note. 

## 2016-02-09 NOTE — Patient Instructions (Signed)
Please take all new medication as prescribed - the antibiotic, and the cough medicine as needed  Please continue all other medications as before, and refills have been done if requested.  Please have the pharmacy call with any other refills you may need.  Please keep your appointments with your specialists as you may have planned    

## 2016-02-09 NOTE — Telephone Encounter (Signed)
Medication sent to new pharmacy

## 2016-02-09 NOTE — Progress Notes (Signed)
Subjective:    Patient ID: Priscilla Anderson, female    DOB: January 30, 1934, 82 y.o.   MRN: [id number]  HPI   Here with 2-3 days acute onset fever, facial pain, pressure, headache, general weakness and malaise, and greenish d/c, with mild ST and cough, but pt denies chest pain, wheezing, increased sob or doe, orthopnea, PND, increased LE swelling, palpitations, dizziness or syncope.  Pt denies new neurological symptoms such as new headache, or facial or extremity weakness or numbness   Pt denies polydipsia, polyuria   Past Medical History  Diagnosis Date  . Nonischemic cardiomyopathy ([place]) 09/22/2011  . CHF (congestive heart failure) ([place])   . Hyperlipidemia   . Hypertension   . Osteopenia   . Adenomatous colon polyp   . Polyp of larynx   . CAD (coronary artery disease)   . AICD (automatic cardioverter/defibrillator) present   . Type II diabetes mellitus [facility])    Past Surgical History  Procedure Laterality Date  . Cardiac defibrillator placement  05/21/2002    s/p re-do pacemaker/defibrillator October 2011- [place].  . Colonoscopy    . Implantable cardioverter defibrillator generator change  09/01/2010    [employer]  . Biv icd genertaor change out N/A 07/29/2014    Procedure: BIV ICD GENERTAOR CHANGE OUT;  Surgeon: Sanda Klein, MD;  Location: [facility] CATH LAB;  Service: Cardiovascular;  Laterality: N/A;  . Vaginal hysterectomy    . Breast cyst excision Left 1980  . Cardiac catheterization  07/28/2005    noncritical CAD  . Cardiac catheterization      "I've had several"  . Microlaryngoscopy with co2 laser and excision of vocal cord lesion  08/2002    /notes 03/29/2011    reports that she has never smoked. She has never used smokeless tobacco. She reports that she uses illicit drugs about twice per week. She reports that she does not drink alcohol. family history includes Arthritis in her father and mother; Diabetes in her other; Heart disease in her brother; Prostate cancer in her  brother. Allergies  Allergen Reactions  . Lanoxin [Digoxin] Nausea Only    Nausea and wgt loss   Current Outpatient Prescriptions on File Prior to Visit  Medication Sig Dispense Refill  . Artificial Tear Solution (SOOTHE XP) SOLN Apply 1 drop to eye daily as needed (dry eyes).    . Ascorbic Acid (VITAMIN C) 500 MG tablet Take 500 mg by mouth daily.      Marland Kitchen aspirin 81 MG tablet Take 81 mg by mouth daily.      . Carboxymethylcellul-Glycerin (REFRESH OPTIVE OP) Apply 1 drop to eye at bedtime.    . carvedilol (COREG) 12.5 MG tablet TAKE 1 TABLET TWICE A DAY 180 tablet 2  . diazepam (VALIUM) 5 MG tablet Take 1 tablet (5 mg total) by mouth at bedtime as needed for anxiety. 90 tablet 1  . furosemide (LASIX) 40 MG tablet Take 1 tablet (40 mg total) by mouth daily. 180 tablet 3  . glucose blood (ACCU-CHEK AVIVA) test strip Test two times daily. Code 250.02 100 each 11  . ibandronate (BONIVA) 150 MG tablet TAKE 1 TABLET ONCE A MONTH 3 tablet 3  . ivabradine (CORLANOR) 5 MG TABS tablet Take 1 tablet (5 mg total) by mouth 2 (two) times daily with a meal. 180 tablet 2  . JANUVIA 100 MG tablet TAKE 1 TABLET DAILY 90 tablet 0  . Lancets MISC Test as directed two times daily. Code 250.02 100 each 11  .  losartan (COZAAR) 50 MG tablet TAKE 1 TABLET DAILY 90 tablet 1  . metFORMIN (GLUCOPHAGE-XR) 500 MG 24 hr tablet Take 1 tablet (500 mg total) by mouth daily with breakfast. 180 tablet 3  . simvastatin (ZOCOR) 20 MG tablet TAKE 1 TABLET AT BEDTIME 90 tablet 1  . spironolactone (ALDACTONE) 25 MG tablet Take 1 tablet (25 mg total) by mouth daily. 90 tablet 2  . Wheat Dextrin (BENEFIBER) POWD Take by mouth. 2 tablespoons once daily     No current facility-administered medications on file prior to visit.   Review of Systems  Constitutional: Negative for unusual diaphoresis or night sweats HENT: Negative for ear swelling or discharge Eyes: Negative for worsening visual haziness  Respiratory: Negative for  choking and stridor.   Gastrointestinal: Negative for distension or worsening eructation Genitourinary: Negative for retention or change in urine volume.  Musculoskeletal: Negative for other MSK pain or swelling Skin: Negative for color change and worsening wound Neurological: Negative for tremors and numbness other than noted  Psychiatric/Behavioral: Negative for decreased concentration or agitation other than above       Objective:   Physical Exam BP 128/70 mmHg  Pulse 98  Temp(Src) 98.5 F (36.9 C) (Oral)  Resp 20  Wt 121 lb (54.885 kg)  SpO2 98% VS noted, mild ill Constitutional: Pt appears in no apparent distress HENT: Head: NCAT.  Right Ear: External ear normal.  Left Ear: External ear normal.  Bilat tm's with mild erythema.  Max sinus areas mild tender.  Pharynx with mild erythema, no exudate  Eyes: . Pupils are equal, round, and reactive to light. Conjunctivae and EOM are normal Neck: Normal range of motion. Neck supple.  Cardiovascular: Normal rate and regular rhythm.   Pulmonary/Chest: Effort normal and breath sounds without rales or wheezing.  Neurological: Pt is alert. Not confused , motor grossly intact Skin: Skin is warm. No rash, no LE edema Psychiatric: Pt behavior is normal. No agitation.     Assessment & Plan:

## 2016-02-12 DIAGNOSIS — J069 Acute upper respiratory infection, unspecified: Secondary | ICD-10-CM | POA: Insufficient documentation

## 2016-02-12 NOTE — Assessment & Plan Note (Signed)
Mild to mod, for antibx course,  to f/u any worsening symptoms or concerns 

## 2016-02-12 NOTE — Assessment & Plan Note (Signed)
stable overall by history and exam, recent data reviewed with pt, and pt to continue medical treatment as before,  to f/u any worsening symptoms or concerns BP Readings from Last 3 Encounters:  02/09/16 128/70  01/07/16 112/70  12/15/15 100/62

## 2016-02-12 NOTE — Assessment & Plan Note (Signed)
stable overall by history and exam, recent data reviewed with pt, and pt to continue medical treatment as before,  to f/u any worsening symptoms or concerns SpO2 Readings from Last 3 Encounters:  02/09/16 98%  01/07/16 97%  12/03/15 98%

## 2016-02-19 ENCOUNTER — Other Ambulatory Visit: Payer: Self-pay | Admitting: Cardiovascular Disease

## 2016-02-19 NOTE — Telephone Encounter (Signed)
Rx(s) sent to pharmacy electronically.  

## 2016-03-17 DIAGNOSIS — Z1231 Encounter for screening mammogram for malignant neoplasm of breast: Secondary | ICD-10-CM | POA: Diagnosis not present

## 2016-04-01 ENCOUNTER — Encounter: Payer: Self-pay | Admitting: Internal Medicine

## 2016-04-02 LAB — CUP PACEART INCLINIC DEVICE CHECK
Brady Statistic AP VP Percent: 41.77 %
Brady Statistic AP VS Percent: 0.03 %
Brady Statistic AS VP Percent: 57.6 %
Brady Statistic AS VS Percent: 0.61 %
Brady Statistic RA Percent Paced: 41.8 %
Brady Statistic RV Percent Paced: 97.92 %
HIGH POWER IMPEDANCE MEASURED VALUE: 39 Ohm
HIGH POWER IMPEDANCE MEASURED VALUE: 57 Ohm
Implantable Lead Implant Date: 20030708
Implantable Lead Implant Date: 20070501
Implantable Lead Implant Date: 20070503
Implantable Lead Location: 753860
Implantable Lead Location: 753860
Lead Channel Impedance Value: 304 Ohm
Lead Channel Impedance Value: 342 Ohm
Lead Channel Impedance Value: 4047 Ohm
Lead Channel Impedance Value: 456 Ohm
Lead Channel Pacing Threshold Pulse Width: 0.4 ms
Lead Channel Sensing Intrinsic Amplitude: 21.25 mV
Lead Channel Sensing Intrinsic Amplitude: 21.25 mV
Lead Channel Setting Pacing Amplitude: 2.5 V
Lead Channel Setting Pacing Amplitude: 3 V
Lead Channel Setting Pacing Pulse Width: 0.4 ms
Lead Channel Setting Pacing Pulse Width: 0.4 ms
MDC IDC LEAD IMPLANT DT: 20111019
MDC IDC LEAD LOCATION: 753858
MDC IDC LEAD LOCATION: 753859
MDC IDC MSMT BATTERY REMAINING LONGEVITY: 55 mo
MDC IDC MSMT BATTERY VOLTAGE: 2.91 V
MDC IDC MSMT LEADCHNL LV IMPEDANCE VALUE: 4047 Ohm
MDC IDC MSMT LEADCHNL LV PACING THRESHOLD AMPLITUDE: 1.375 V
MDC IDC MSMT LEADCHNL RA IMPEDANCE VALUE: 418 Ohm
MDC IDC MSMT LEADCHNL RA PACING THRESHOLD AMPLITUDE: 0.75 V
MDC IDC MSMT LEADCHNL RA PACING THRESHOLD PULSEWIDTH: 0.4 ms
MDC IDC MSMT LEADCHNL RA SENSING INTR AMPL: 3.125 mV
MDC IDC MSMT LEADCHNL RA SENSING INTR AMPL: 3.125 mV
MDC IDC MSMT LEADCHNL RV PACING THRESHOLD AMPLITUDE: 1.5 V
MDC IDC MSMT LEADCHNL RV PACING THRESHOLD PULSEWIDTH: 0.4 ms
MDC IDC SESS DTM: 20170131152910
MDC IDC SET LEADCHNL RA PACING AMPLITUDE: 2 V
MDC IDC SET LEADCHNL RV SENSING SENSITIVITY: 0.3 mV

## 2016-04-10 ENCOUNTER — Other Ambulatory Visit: Payer: Self-pay | Admitting: Cardiology

## 2016-04-12 ENCOUNTER — Encounter: Payer: Medicare Other | Admitting: *Deleted

## 2016-04-12 ENCOUNTER — Telehealth: Payer: Self-pay | Admitting: Cardiology

## 2016-04-12 NOTE — Telephone Encounter (Signed)
LMOVM reminding pt to send remote transmission.   

## 2016-04-15 ENCOUNTER — Encounter: Payer: Self-pay | Admitting: Cardiology

## 2016-04-21 ENCOUNTER — Ambulatory Visit (INDEPENDENT_AMBULATORY_CARE_PROVIDER_SITE_OTHER): Payer: Medicare Other | Admitting: *Deleted

## 2016-04-21 DIAGNOSIS — I420 Dilated cardiomyopathy: Secondary | ICD-10-CM

## 2016-04-21 DIAGNOSIS — I429 Cardiomyopathy, unspecified: Secondary | ICD-10-CM

## 2016-04-26 NOTE — Progress Notes (Signed)
Remote ICD transmission.   

## 2016-04-28 ENCOUNTER — Other Ambulatory Visit: Payer: Self-pay | Admitting: Internal Medicine

## 2016-04-29 LAB — CUP PACEART REMOTE DEVICE CHECK
Brady Statistic AP VS Percent: 0.22 %
Brady Statistic AS VP Percent: 41.85 %
Brady Statistic AS VS Percent: 1.07 %
Date Time Interrogation Session: 20170608205220
HIGH POWER IMPEDANCE MEASURED VALUE: 40 Ohm
HIGH POWER IMPEDANCE MEASURED VALUE: 56 Ohm
Implantable Lead Implant Date: 20030708
Implantable Lead Implant Date: 20070501
Implantable Lead Implant Date: 20070503
Implantable Lead Implant Date: 20111019
Implantable Lead Location: 753860
Implantable Lead Location: 753860
Implantable Lead Model: 5076
Implantable Lead Model: 6949
Lead Channel Impedance Value: 342 Ohm
Lead Channel Impedance Value: 342 Ohm
Lead Channel Impedance Value: 4047 Ohm
Lead Channel Impedance Value: 418 Ohm
Lead Channel Pacing Threshold Amplitude: 1.25 V
Lead Channel Pacing Threshold Pulse Width: 0.4 ms
Lead Channel Pacing Threshold Pulse Width: 0.4 ms
Lead Channel Sensing Intrinsic Amplitude: 21.25 mV
Lead Channel Sensing Intrinsic Amplitude: 21.25 mV
Lead Channel Setting Pacing Amplitude: 2 V
Lead Channel Setting Pacing Amplitude: 2.25 V
Lead Channel Setting Pacing Amplitude: 3 V
Lead Channel Setting Pacing Pulse Width: 0.4 ms
Lead Channel Setting Pacing Pulse Width: 0.4 ms
MDC IDC LEAD LOCATION: 753858
MDC IDC LEAD LOCATION: 753859
MDC IDC MSMT BATTERY REMAINING LONGEVITY: 45 mo
MDC IDC MSMT BATTERY VOLTAGE: 2.95 V
MDC IDC MSMT LEADCHNL LV IMPEDANCE VALUE: 4047 Ohm
MDC IDC MSMT LEADCHNL RA IMPEDANCE VALUE: 418 Ohm
MDC IDC MSMT LEADCHNL RA PACING THRESHOLD AMPLITUDE: 0.75 V
MDC IDC MSMT LEADCHNL RA SENSING INTR AMPL: 2.5 mV
MDC IDC MSMT LEADCHNL RA SENSING INTR AMPL: 2.5 mV
MDC IDC MSMT LEADCHNL RV PACING THRESHOLD AMPLITUDE: 1.5 V
MDC IDC MSMT LEADCHNL RV PACING THRESHOLD PULSEWIDTH: 0.4 ms
MDC IDC SET LEADCHNL RV SENSING SENSITIVITY: 0.3 mV
MDC IDC STAT BRADY AP VP PERCENT: 56.85 %
MDC IDC STAT BRADY RA PERCENT PACED: 57.07 %
MDC IDC STAT BRADY RV PERCENT PACED: 96.85 %

## 2016-04-30 ENCOUNTER — Other Ambulatory Visit: Payer: Self-pay | Admitting: Cardiovascular Disease

## 2016-05-02 NOTE — Telephone Encounter (Signed)
Rx(s) sent to pharmacy electronically.  

## 2016-05-04 ENCOUNTER — Encounter: Payer: Self-pay | Admitting: Cardiology

## 2016-05-05 ENCOUNTER — Ambulatory Visit (INDEPENDENT_AMBULATORY_CARE_PROVIDER_SITE_OTHER): Payer: Medicare Other | Admitting: Ophthalmology

## 2016-05-05 DIAGNOSIS — H2513 Age-related nuclear cataract, bilateral: Secondary | ICD-10-CM | POA: Diagnosis not present

## 2016-05-05 DIAGNOSIS — H43813 Vitreous degeneration, bilateral: Secondary | ICD-10-CM | POA: Diagnosis not present

## 2016-05-05 DIAGNOSIS — H35033 Hypertensive retinopathy, bilateral: Secondary | ICD-10-CM

## 2016-05-05 DIAGNOSIS — I10 Essential (primary) hypertension: Secondary | ICD-10-CM | POA: Diagnosis not present

## 2016-05-05 DIAGNOSIS — E113293 Type 2 diabetes mellitus with mild nonproliferative diabetic retinopathy without macular edema, bilateral: Secondary | ICD-10-CM | POA: Diagnosis not present

## 2016-05-05 DIAGNOSIS — E11319 Type 2 diabetes mellitus with unspecified diabetic retinopathy without macular edema: Secondary | ICD-10-CM | POA: Diagnosis not present

## 2016-05-21 ENCOUNTER — Other Ambulatory Visit (HOSPITAL_COMMUNITY): Payer: Self-pay | Admitting: Cardiology

## 2016-05-23 DIAGNOSIS — H25813 Combined forms of age-related cataract, bilateral: Secondary | ICD-10-CM | POA: Diagnosis not present

## 2016-05-23 DIAGNOSIS — H40013 Open angle with borderline findings, low risk, bilateral: Secondary | ICD-10-CM | POA: Diagnosis not present

## 2016-06-08 NOTE — Progress Notes (Signed)
Patient ID: KWYNN SCHLOTTER, female   DOB: [birth year], 82 y.o.   MRN: [id number]    Cardiology Office Note    Date:  06/14/2016   ID:  Priscilla Anderson, Priscilla Anderson 02/06/1934, MRN [id number]  PCP:  Cathlean Cower, MD  Cardiologist:  Sanda Klein, MD   Chief Complaint  Patient presents with  . Follow-up    History of Present Illness:  Priscilla Anderson is a 82 y.o. female with long-standing nonischemic dilated cardiomyopathy (EF 20% Sep 2016) and severely depressed left ventricular systolic function, moderate-severe mitral regurgitation, mild and nonobstructive CAD  and a biventricular pacemaker/defibrillator who returns in follow-up. She sees Dr. Aundra Dubin in the heart failure clinic.    in eyes angina, edema, syncope, weakness/dizziness, palpitations, focal neurological events. She performs her own housework without much limitation.   Device interrogation by remote download April 21, 2016 shows normal BiV-ICD function with an estimated generator longevity of 3.7 years. Lead parameters are favorable and stable. Activity level is about 1.7 hours per day. Optivol does not show evidence of fluid overload. She has 97% biventricular pacing. She has not had any atrial tachycardia or atrial fibrillation. She does also have 57% atrial pacing. No episodes of VT/VF have been recorded in the detection window set on the device, but a review of her "ventricular sensed" electrograms shows numerous episodes of relatively slow ventricular tachycardia with a cycle length of around 400 ms (rate 150 bpm). A new VT monitor zone was programmed on her device for this reason at her last appointment, but no events are recorded since that time  Past Medical History:  Diagnosis Date  . Adenomatous colon polyp   . AICD (automatic cardioverter/defibrillator) present   . CAD (coronary artery disease)   . CHF (congestive heart failure) ([place])   . Hyperlipidemia   . Hypertension   . Nonischemic cardiomyopathy ([place]) 09/22/2011  .  Osteopenia   . Polyp of larynx   . Type II diabetes mellitus ([place])     Past Surgical History:  Procedure Laterality Date  . BIV ICD GENERTAOR CHANGE OUT N/A 07/29/2014   Procedure: BIV ICD GENERTAOR CHANGE OUT;  Surgeon: Sanda Klein, MD;  Location: [facility] CATH [facility];  Service: Cardiovascular;  Laterality: N/A;  . BREAST CYST EXCISION Left 1980  . CARDIAC CATHETERIZATION  07/28/2005   noncritical CAD  . CARDIAC CATHETERIZATION     "I've had several"  . CARDIAC DEFIBRILLATOR PLACEMENT  05/21/2002   s/p re-do pacemaker/defibrillator October 2011- [place].  . COLONOSCOPY    . IMPLANTABLE CARDIOVERTER DEFIBRILLATOR GENERATOR CHANGE  09/01/2010   Medtronic  . MICROLARYNGOSCOPY WITH CO2 LASER AND EXCISION OF VOCAL CORD LESION  08/2002   Archie Endo 03/29/2011  . VAGINAL HYSTERECTOMY      Outpatient Medications Prior to Visit  Medication Sig Dispense Refill  . Artificial Tear Solution (SOOTHE XP) SOLN Apply 1 drop to eye daily as needed (dry eyes).    . Ascorbic Acid (VITAMIN C) 500 MG tablet Take 500 mg by mouth daily.      Marland Kitchen aspirin 81 MG tablet Take 81 mg by mouth daily.      . Carboxymethylcellul-Glycerin (REFRESH OPTIVE OP) Apply 1 drop to eye at bedtime.    . carvedilol (COREG) 12.5 MG tablet Take 1 tablet (12.5 mg total) by mouth 2 (two) times daily. 180 tablet 2  . CORLANOR 5 MG TABS tablet TAKE 1 TABLET TWICE A DAY WITH MEALS 180 tablet 1  . diazepam (VALIUM) 5 MG tablet  Take 1 tablet (5 mg total) by mouth at bedtime as needed for anxiety. 90 tablet 1  . furosemide (LASIX) 40 MG tablet Take 1 tablet (40 mg total) by mouth daily. 180 tablet 3  . glucose blood (ACCU-CHEK AVIVA) test strip Test two times daily. Code 250.02 100 each 11  . ibandronate (BONIVA) 150 MG tablet TAKE 1 TABLET ONCE A MONTH 3 tablet 3  . JANUVIA 100 MG tablet TAKE 1 TABLET DAILY 90 tablet 0  . Lancets MISC Test as directed two times daily. Code 250.02 100 each 11  . losartan (COZAAR) 50 MG tablet TAKE 1  TABLET DAILY 90 tablet 2  . metFORMIN (GLUCOPHAGE-XR) 500 MG 24 hr tablet Take 1 tablet (500 mg total) by mouth daily with breakfast. 180 tablet 3  . simvastatin (ZOCOR) 20 MG tablet TAKE 1 TABLET AT BEDTIME 90 tablet 1  . spironolactone (ALDACTONE) 25 MG tablet Take 1 tablet (25 mg total) by mouth daily. 90 tablet 2  . Wheat Dextrin (BENEFIBER) POWD Take by mouth. 2 tablespoons once daily    . azithromycin (ZITHROMAX Z-PAK) 250 MG tablet Use as directed (Patient not taking: Reported on 06/14/2016) 6 tablet 1  . HYDROcodone-homatropine (HYCODAN) 5-1.5 MG/5ML syrup Take 5 mLs by mouth every 6 (six) hours as needed for cough. (Patient not taking: Reported on 06/14/2016) 180 mL 0  . ivabradine (CORLANOR) 5 MG TABS tablet Take 1 tablet (5 mg total) by mouth 2 (two) times daily with a meal. (Patient not taking: Reported on 06/14/2016) 180 tablet 2   No facility-administered medications prior to visit.      Allergies:   Lanoxin [digoxin]   Social History   Social History  . Marital status: Widowed    Spouse name: N/A  . Number of children: 2  . Years of education: N/A   Occupational History  . retired Investment banker, corporate. report clerk Retired   Social History Main Topics  . Smoking status: Never Smoker  . Smokeless tobacco: Never Used  . Alcohol use No  . Drug use:     Frequency: 2.0 times per week  . Sexual activity: Not on file   Other Topics Concern  . Not on file   Social History Narrative   Husband now with Multiple Myeloma- see Dr. Juanita Craver     Family History:  The patient's family history includes Arthritis in her father and mother; Diabetes in her other; Heart disease in her brother; Prostate cancer in her brother.   ROS:   Please see the history of present illness.    ROS All other systems reviewed and are negative.   PHYSICAL EXAM:   VS:  BP 92/62 (BP Location: Right Arm, Patient Position: Sitting, Cuff Size: Normal)   Pulse 72   Ht _0  (1.626 m)   Wt 57.3 kg  (126 lb 6.4 oz)   BMI 21.70 kg/m    GEN: Well nourished, well developed, in no acute distress  HEENT: normal  Neck: no JVD, carotid bruits, or masses Cardiac: Laterally displaced apical impulse, paradoxically split S2, RRR; grade 1/6 holosystolic apical murmur no diastolic murmurs, rubs, or gallops,no edema  Respiratory:  clear to auscultation bilaterally, normal work of breathing GI: soft, nontender, nondistended, + BS MS: no deformity or atrophy  Skin: warm and dry, no rash Neuro:  Alert and Oriented x 3, Strength and sensation are intact Psych: euthymic mood, full affect  Wt Readings from Last 3 Encounters:  06/14/16 57.3 kg (126 lb 6.4  oz)  02/09/16 54.9 kg (121 lb)  01/07/16 55.3 kg (122 lb)      Studies/Labs Reviewed:   EKG:  EKG is not ordered today.  The intracardiac recording today demonstrates atrial sensed-biventricular paced rhythm  Recent Labs: 07/09/2015: TSH 1.52 12/03/2015: B Natriuretic Peptide 982.7 01/07/2016: ALT 8; BUN 27; Creatinine, Ser 1.92; Hemoglobin 13.4; Platelets 248.0; Potassium 4.6; Sodium 139   Lipid Panel    Component Value Date/Time   CHOL 135 01/07/2016 1511   TRIG 130.0 01/07/2016 1511   HDL 52.90 01/07/2016 1511   CHOLHDL 3 01/07/2016 1511   VLDL 26.0 01/07/2016 1511   LDLCALC 56 01/07/2016 1511    Additional studies/ records that were reviewed today include:  Heart Failure clinic notes    ASSESSMENT:    1. Chronic systolic congestive heart failure, NYHA class 1 ([place])   2. Nonischemic dilated cardiomyopathy ([place])   3. Moderate mitral regurgitation by prior echocardiogram   4. Biventricular ICD (implantable cardioverter-defibrillator) in place   5. Chronic renal insufficiency, stage III (moderate)   6. Hyperlipidemia   7. Type 2 diabetes mellitus with renal manifestations not at goal [facility])   8. Ventricular tachycardia ([place])      PLAN:  In order of problems listed above:  1. CHF: Clinically euvolemic, confirmed by normal  optivol. BNP in January not much different from October, although symptoms are now better NYHA functional class I-II. Subjective benefit from Ivabradine. No changes made to medications or diuretic prescription she is on comprehensive medical therapy with a maximum tolerated dose of beta blocker, angiotensin receptor blocker, spironolactone (limited by blood pressure) as well as low dose loop diuretic. 2. CMP: Was recent EF 20% by echo, 17% by scintigraphy. Moderate nonobstructive coronary artery disease by previous cath. Anterior scar without ischemia on nuclear study recently 3. MR: Suspected to be secondary to cardiomyopathy, severity waxes and wanes with heart failure exacerbation. 4. CRT-D: She has never required treatment from her device. BiV pacing efficiency is 98%. CareLink download every 3 months and office visit in 6 months. 5. CKD stage 3-4: Monitor frequently  6. HLP: Excellent recent lipid profile  7. DM: Improve glycemic control, most recent hemoglobin A1c almost at goal 7.1%  8. VT:  VT monitor zone added at the last appointment to help better quantify this. No events recorded at >150 bpm. No antiarrhythmic therapy recommended unless there is substantial deterioration in the percentage of biventricular pacing.     Medication Adjustments/Labs and Tests Ordered: Current medicines are reviewed at length with the patient today.  Concerns regarding medicines are outlined above.  Medication changes, Labs and Tests ordered today are listed in the Patient Instructions below. Patient Instructions  Dr Sallyanne Kuster recommends that you continue on your current medications as directed. Please refer to the Current Medication list given to you today.  Your physician recommends that you return for lab work in 4 months. You will receive lab slips in the mail closer to that time period.  Dr Sallyanne Kuster recommends that you schedule a follow-up appointment in 4 months. You will receive a reminder letter in  the mail two months in advance. If you don't receive a letter, please call our office to schedule the follow-up appointment.  If you need a refill on your cardiac medications before your next appointment, please call your pharmacy. Signed, Sanda Klein, MD  06/14/2016 1:51 PM    [facility] [place], [place], [place]  [address] Phone: [phone]; Fax: [phone]    

## 2016-06-14 ENCOUNTER — Encounter: Payer: Self-pay | Admitting: Cardiovascular Disease

## 2016-06-14 ENCOUNTER — Ambulatory Visit (INDEPENDENT_AMBULATORY_CARE_PROVIDER_SITE_OTHER): Payer: Medicare Other | Admitting: Cardiovascular Disease

## 2016-06-14 VITALS — BP 92/62 | HR 72 | Ht 64.0 in | Wt 126.4 lb

## 2016-06-14 DIAGNOSIS — Z95810 Presence of automatic (implantable) cardiac defibrillator: Secondary | ICD-10-CM | POA: Diagnosis not present

## 2016-06-14 DIAGNOSIS — I5022 Chronic systolic (congestive) heart failure: Secondary | ICD-10-CM | POA: Diagnosis not present

## 2016-06-14 DIAGNOSIS — I429 Cardiomyopathy, unspecified: Secondary | ICD-10-CM | POA: Diagnosis not present

## 2016-06-14 DIAGNOSIS — I472 Ventricular tachycardia, unspecified: Secondary | ICD-10-CM

## 2016-06-14 DIAGNOSIS — N183 Chronic kidney disease, stage 3 unspecified: Secondary | ICD-10-CM

## 2016-06-14 DIAGNOSIS — I420 Dilated cardiomyopathy: Secondary | ICD-10-CM

## 2016-06-14 DIAGNOSIS — N189 Chronic kidney disease, unspecified: Secondary | ICD-10-CM

## 2016-06-14 DIAGNOSIS — I340 Nonrheumatic mitral (valve) insufficiency: Secondary | ICD-10-CM | POA: Diagnosis not present

## 2016-06-14 DIAGNOSIS — E1129 Type 2 diabetes mellitus with other diabetic kidney complication: Secondary | ICD-10-CM

## 2016-06-14 DIAGNOSIS — E785 Hyperlipidemia, unspecified: Secondary | ICD-10-CM

## 2016-06-14 NOTE — Patient Instructions (Signed)
Dr Sallyanne Kuster recommends that you continue on your current medications as directed. Please refer to the Current Medication list given to you today.  Your physician recommends that you return for lab work in 4 months. You will receive lab slips in the mail closer to that time period.  Dr Sallyanne Kuster recommends that you schedule a follow-up appointment in 4 months. You will receive a reminder letter in the mail two months in advance. If you don't receive a letter, please call our office to schedule the follow-up appointment.  If you need a refill on your cardiac medications before your next appointment, please call your pharmacy.

## 2016-06-25 ENCOUNTER — Other Ambulatory Visit: Payer: Self-pay | Admitting: Cardiovascular Disease

## 2016-07-12 ENCOUNTER — Ambulatory Visit: Payer: Medicare Other | Admitting: Internal Medicine

## 2016-07-27 ENCOUNTER — Other Ambulatory Visit: Payer: Self-pay | Admitting: Internal Medicine

## 2016-08-10 ENCOUNTER — Other Ambulatory Visit (INDEPENDENT_AMBULATORY_CARE_PROVIDER_SITE_OTHER): Payer: Medicare Other

## 2016-08-10 ENCOUNTER — Ambulatory Visit (INDEPENDENT_AMBULATORY_CARE_PROVIDER_SITE_OTHER): Payer: Medicare Other | Admitting: Internal Medicine

## 2016-08-10 ENCOUNTER — Encounter: Payer: Self-pay | Admitting: Internal Medicine

## 2016-08-10 VITALS — BP 100/64 | HR 80 | Temp 98.7°F | Resp 20 | Wt 124.0 lb

## 2016-08-10 DIAGNOSIS — I10 Essential (primary) hypertension: Secondary | ICD-10-CM | POA: Diagnosis not present

## 2016-08-10 DIAGNOSIS — E1129 Type 2 diabetes mellitus with other diabetic kidney complication: Secondary | ICD-10-CM

## 2016-08-10 DIAGNOSIS — Z860100 Personal history of colon polyps, unspecified: Secondary | ICD-10-CM

## 2016-08-10 DIAGNOSIS — F411 Generalized anxiety disorder: Secondary | ICD-10-CM | POA: Diagnosis not present

## 2016-08-10 DIAGNOSIS — E785 Hyperlipidemia, unspecified: Secondary | ICD-10-CM | POA: Diagnosis not present

## 2016-08-10 LAB — HEPATIC FUNCTION PANEL
ALK PHOS: 46 U/L (ref 39–117)
ALT: 8 U/L (ref 0–35)
AST: 15 U/L (ref 0–37)
Albumin: 4.3 g/dL (ref 3.5–5.2)
BILIRUBIN DIRECT: 0 mg/dL (ref 0.0–0.3)
TOTAL PROTEIN: 7.3 g/dL (ref 6.0–8.3)
Total Bilirubin: 0.5 mg/dL (ref 0.2–1.2)

## 2016-08-10 LAB — CBC WITH DIFFERENTIAL/PLATELET
BASOS ABS: 0.1 10*3/uL (ref 0.0–0.1)
Basophils Relative: 1.1 % (ref 0.0–3.0)
EOS ABS: 0.1 10*3/uL (ref 0.0–0.7)
Eosinophils Relative: 1.9 % (ref 0.0–5.0)
HCT: 39.7 % (ref 36.0–46.0)
Hemoglobin: 13.2 g/dL (ref 12.0–15.0)
LYMPHS ABS: 1.2 10*3/uL (ref 0.7–4.0)
Lymphocytes Relative: 19.3 % (ref 12.0–46.0)
MCHC: 33.3 g/dL (ref 30.0–36.0)
MCV: 96.1 fl (ref 78.0–100.0)
Monocytes Absolute: 0.7 10*3/uL (ref 0.1–1.0)
Monocytes Relative: 10.2 % (ref 3.0–12.0)
NEUTROS ABS: 4.3 10*3/uL (ref 1.4–7.7)
Neutrophils Relative %: 67.5 % (ref 43.0–77.0)
PLATELETS: 186 10*3/uL (ref 150.0–400.0)
RBC: 4.13 Mil/uL (ref 3.87–5.11)
RDW: 14.6 % (ref 11.5–15.5)
WBC: 6.4 10*3/uL (ref 4.0–10.5)

## 2016-08-10 LAB — LIPID PANEL
CHOL/HDL RATIO: 2
Cholesterol: 132 mg/dL (ref 0–200)
HDL: 53.9 mg/dL (ref >39.00)
LDL CALC: 59 mg/dL (ref 0–99)
NONHDL: 78.18
Triglycerides: 94 mg/dL (ref 0.0–149.0)
VLDL: 18.8 mg/dL (ref 0.0–40.0)

## 2016-08-10 LAB — BASIC METABOLIC PANEL
BUN: 28 mg/dL — ABNORMAL HIGH (ref 6–23)
CHLORIDE: 103 meq/L (ref 96–112)
CO2: 29 meq/L (ref 19–32)
Calcium: 9.6 mg/dL (ref 8.4–10.5)
Creatinine, Ser: 1.81 mg/dL — ABNORMAL HIGH (ref 0.40–1.20)
GFR: 34.55 mL/min — ABNORMAL LOW (ref >60.00)
Glucose, Bld: 111 mg/dL — ABNORMAL HIGH (ref 70–99)
Potassium: 4.7 mEq/L (ref 3.5–5.1)
SODIUM: 140 meq/L (ref 135–145)

## 2016-08-10 LAB — HEMOGLOBIN A1C: Hgb A1c MFr Bld: 6.7 % — ABNORMAL HIGH (ref 4.6–6.5)

## 2016-08-10 MED ORDER — DIAZEPAM 5 MG PO TABS: 5.0000 mg | ORAL_TABLET | Freq: Every evening | ORAL | 1 refills | Status: DC | PRN

## 2016-08-10 NOTE — Patient Instructions (Signed)
Please continue all other medications as before, and refills have been done if requested - the diazepam  Please have the pharmacy call with any other refills you may need.  Please continue your efforts at being more active, low cholesterol diabetic diet, and weight control.  You are otherwise up to date with prevention measures today.  Please keep your appointments with your specialists as you may have planned  You will be contacted regarding the referral for: Dr Fuller Plan to see about the risk related to any further colonoscopy  Please go to the LAB in the Basement (turn left off the elevator) for the tests to be done today  You will be contacted by phone if any changes need to be made immediately.  Otherwise, you will receive a letter about your results with an explanation, but please check with MyChart first.  Please remember to sign up for MyChart if you have not done so, as this will be important to you in the future with finding out test results, communicating by private email, and scheduling acute appointments online when needed.  Please return in 6 months, or sooner if needed

## 2016-08-10 NOTE — Progress Notes (Signed)
Pre visit review using our clinic review tool, if applicable. No additional management support is needed unless otherwise documented below in the visit note. 

## 2016-08-10 NOTE — Progress Notes (Signed)
Subjective:    Patient ID: Priscilla Anderson, female    DOB: 1934/06/22, 82 y.o.   MRN: [id number]  HPI  Here to f/u; overall doing ok,  Pt denies chest pain, increasing sob or doe, wheezing, orthopnea, PND, increased LE swelling, palpitations, dizziness or syncope.  Pt denies new neurological symptoms such as new headache, or facial or extremity weakness or numbness.  Pt denies polydipsia, polyuria, or low sugar episode.   Pt denies new neurological symptoms such as new headache, or facial or extremity weakness or numbness.   Pt states overall good compliance with meds, mostly trying to follow appropriate diet, with wt overall stable  Has f/u appt with Dr Aundra Dubin oct 17  Due for f/u colonoscopy dec 2017 with Dr Fuller Plan for 5 yr f/u hx of polyps  Declines flu shot, gets at health dept Denies worsening depressive symptoms, suicidal ideation, or panic; has ongoing anxiety, asks for diazepam refill.  Past Medical History:  Diagnosis Date  . Adenomatous colon polyp   . AICD (automatic cardioverter/defibrillator) present   . CAD (coronary artery disease)   . CHF (congestive heart failure) ([place])   . Hyperlipidemia   . Hypertension   . Nonischemic cardiomyopathy ([place]) 09/22/2011  . Osteopenia   . Polyp of larynx   . Type II diabetes mellitus ([place])    Past Surgical History:  Procedure Laterality Date  . BIV ICD GENERTAOR CHANGE OUT N/A 07/29/2014   Procedure: BIV ICD GENERTAOR CHANGE OUT;  Surgeon: Sanda Klein, MD;  Location: [facility] CATH LAB;  Service: Cardiovascular;  Laterality: N/A;  . BREAST CYST EXCISION Left 1980  . CARDIAC CATHETERIZATION  07/28/2005   noncritical CAD  . CARDIAC CATHETERIZATION     "I've had several"  . CARDIAC DEFIBRILLATOR PLACEMENT  05/21/2002   s/p re-do pacemaker/defibrillator October 2011- [place].  . COLONOSCOPY    . IMPLANTABLE CARDIOVERTER DEFIBRILLATOR GENERATOR CHANGE  09/01/2010   Medtronic  . MICROLARYNGOSCOPY WITH CO2 LASER AND EXCISION OF VOCAL CORD  LESION  08/2002   Archie Endo 03/29/2011  . VAGINAL HYSTERECTOMY      reports that she has never smoked. She has never used smokeless tobacco. She reports that she uses drugs about 2 times per week. She reports that she does not drink alcohol. family history includes Arthritis in her father and mother; Diabetes in her other; Heart disease in her brother; Prostate cancer in her brother. Allergies  Allergen Reactions  . Lanoxin [Digoxin] Nausea Only    Nausea and wgt loss   Current Outpatient Prescriptions on File Prior to Visit  Medication Sig Dispense Refill  . Artificial Tear Solution (SOOTHE XP) SOLN Apply 1 drop to eye daily as needed (dry eyes).    . Ascorbic Acid (VITAMIN C) 500 MG tablet Take 500 mg by mouth daily.      Marland Kitchen aspirin 81 MG tablet Take 81 mg by mouth daily.      . Carboxymethylcellul-Glycerin (REFRESH OPTIVE OP) Apply 1 drop to eye at bedtime.    . carvedilol (COREG) 12.5 MG tablet Take 1 tablet (12.5 mg total) by mouth 2 (two) times daily. 180 tablet 2  . CORLANOR 5 MG TABS tablet TAKE 1 TABLET TWICE A DAY WITH MEALS 180 tablet 1  . furosemide (LASIX) 40 MG tablet Take 1 tablet (40 mg total) by mouth daily. 180 tablet 3  . glucose blood (ACCU-CHEK AVIVA) test strip Test two times daily. Code 250.02 100 each 11  . ibandronate (BONIVA) 150 MG tablet  TAKE 1 TABLET ONCE A MONTH 3 tablet 3  . JANUVIA 100 MG tablet TAKE 1 TABLET DAILY 90 tablet 0  . Lancets MISC Test as directed two times daily. Code 250.02 100 each 11  . losartan (COZAAR) 50 MG tablet TAKE 1 TABLET DAILY 90 tablet 2  . metFORMIN (GLUCOPHAGE-XR) 500 MG 24 hr tablet Take 1 tablet (500 mg total) by mouth daily with breakfast. 180 tablet 3  . simvastatin (ZOCOR) 20 MG tablet Take 1 tablet (20 mg total) by mouth at bedtime. 90 tablet 0  . spironolactone (ALDACTONE) 25 MG tablet Take 1 tablet (25 mg total) by mouth daily. 90 tablet 2  . Wheat Dextrin (BENEFIBER) POWD Take by mouth. 2 tablespoons once daily     No  current facility-administered medications on file prior to visit.    Review of Systems  Constitutional: Negative for unusual diaphoresis or night sweats HENT: Negative for ear swelling or discharge Eyes: Negative for worsening visual haziness  Respiratory: Negative for choking and stridor.   Gastrointestinal: Negative for distension or worsening eructation Genitourinary: Negative for retention or change in urine volume.  Musculoskeletal: Negative for other MSK pain or swelling Skin: Negative for color change and worsening wound Neurological: Negative for tremors and numbness other than noted  Psychiatric/Behavioral: Negative for decreased concentration or agitation other than above       Objective:   Physical Exam BP 100/64   Pulse 80   Temp 98.7 F (37.1 C) (Oral)   Resp 20   Wt 124 lb (56.2 kg)   SpO2 94%   BMI 21.28 kg/m  VS noted, not ill appearing Constitutional: Pt appears in no apparent distress HENT: Head: NCAT.  Right Ear: External ear normal.  Left Ear: External ear normal.  Eyes: . Pupils are equal, round, and reactive to light. Conjunctivae and EOM are normal Neck: Normal range of motion. Neck supple.  Cardiovascular: Normal rate and regular rhythm.   Pulmonary/Chest: Effort normal and breath sounds without rales or wheezing.  Neurological: Pt is alert. Not confused , motor grossly intact Skin: Skin is warm. No rash, no LE edema Psychiatric: Pt behavior is normal. No agitation.     Assessment & Plan:

## 2016-08-13 ENCOUNTER — Other Ambulatory Visit: Payer: Self-pay | Admitting: Internal Medicine

## 2016-08-13 NOTE — Assessment & Plan Note (Signed)
Stable, for diazepam refill,  to f/u any worsening symptoms or concerns

## 2016-08-13 NOTE — Assessment & Plan Note (Signed)
stable overall by history and exam, recent data reviewed with pt, and pt to continue medical treatment as before,  to f/u any worsening symptoms or concerns BP Readings from Last 3 Encounters:  08/10/16 100/64  06/14/16 92/62  02/09/16 128/70

## 2016-08-13 NOTE — Assessment & Plan Note (Signed)
stable overall by history and exam, recent data reviewed with pt, and pt to continue medical treatment as before,  to f/u any worsening symptoms or concerns Lab Results  Component Value Date   HGBA1C 6.7 (H) 08/10/2016

## 2016-08-13 NOTE — Assessment & Plan Note (Signed)
stable overall by history and exam, recent data reviewed with pt, and pt to continue medical treatment as before,  to f/u any worsening symptoms or concerns Lab Results  Component Value Date   [facility] 59 08/10/2016   '

## 2016-08-13 NOTE — Assessment & Plan Note (Signed)
[place] for referral to Dr Justin Mend for consideration of risk/benefit for colonsocopy due soon

## 2016-08-16 DIAGNOSIS — Z23 Encounter for immunization: Secondary | ICD-10-CM | POA: Diagnosis not present

## 2016-08-23 ENCOUNTER — Telehealth (HOSPITAL_COMMUNITY): Payer: Self-pay | Admitting: Pharmacist

## 2016-08-23 NOTE — Telephone Encounter (Signed)
Corlanor 5 mg BID PA approved by [employer] through 08/09/17.   Ruta Hinds. Velva Harman, PharmD, BCPS, CPP Clinical Pharmacist Pager: [phone] Phone: [phone] 08/23/2016 3:51 PM

## 2016-08-24 ENCOUNTER — Encounter: Payer: Self-pay | Admitting: Gastroenterology

## 2016-08-30 ENCOUNTER — Ambulatory Visit (HOSPITAL_COMMUNITY)
Admission: RE | Admit: 2016-08-30 | Discharge: 2016-08-30 | Disposition: A | Discharge status: Home / Self Care | Payer: Medicare Other | Source: Ambulatory Visit | Attending: Cardiology | Admitting: Cardiology

## 2016-08-30 ENCOUNTER — Encounter (HOSPITAL_COMMUNITY): Payer: Self-pay

## 2016-08-30 VITALS — BP 102/60 | HR 72 | Wt 126.2 lb

## 2016-08-30 DIAGNOSIS — N183 Chronic kidney disease, stage 3 unspecified: Secondary | ICD-10-CM

## 2016-08-30 DIAGNOSIS — I429 Cardiomyopathy, unspecified: Secondary | ICD-10-CM | POA: Diagnosis not present

## 2016-08-30 DIAGNOSIS — Z7982 Long term (current) use of aspirin: Secondary | ICD-10-CM | POA: Insufficient documentation

## 2016-08-30 DIAGNOSIS — I2510 Atherosclerotic heart disease of native coronary artery without angina pectoris: Secondary | ICD-10-CM | POA: Insufficient documentation

## 2016-08-30 DIAGNOSIS — I5022 Chronic systolic (congestive) heart failure: Secondary | ICD-10-CM

## 2016-08-30 DIAGNOSIS — I340 Nonrheumatic mitral (valve) insufficiency: Secondary | ICD-10-CM | POA: Diagnosis not present

## 2016-08-30 DIAGNOSIS — Z7984 Long term (current) use of oral hypoglycemic drugs: Secondary | ICD-10-CM | POA: Insufficient documentation

## 2016-08-30 DIAGNOSIS — Z95810 Presence of automatic (implantable) cardiac defibrillator: Secondary | ICD-10-CM | POA: Insufficient documentation

## 2016-08-30 DIAGNOSIS — E785 Hyperlipidemia, unspecified: Secondary | ICD-10-CM | POA: Diagnosis not present

## 2016-08-30 DIAGNOSIS — Z8249 Family history of ischemic heart disease and other diseases of the circulatory system: Secondary | ICD-10-CM | POA: Insufficient documentation

## 2016-08-30 DIAGNOSIS — Z86718 Personal history of other venous thrombosis and embolism: Secondary | ICD-10-CM | POA: Insufficient documentation

## 2016-08-30 NOTE — Patient Instructions (Signed)
We will contact you in 6 months to schedule your next appointment.  

## 2016-08-30 NOTE — Progress Notes (Signed)
Patient ID: BERNECE CASSADAY, female   DOB: 06-Aug-1934, 82 y.o.   MRN: [id number]   Advanced Heart Failure Clinic Note   Patient ID: ANELY CHARLESTON, female   DOB: 10/06/1934, 82 y.o.   MRN: [id number] PCP: Dr. Jenny Reichmann Primary Cardiologist: Dr. Sallyanne Kuster HF Cardiologist: Dr. Aundra Dubin  82 yo with long history of chronic systolic CHF from nonischemic cardiomyopathy presents for CHF clinic evaluation. She had cardiac cath in 2006 with nonobstructive CAD.  Cardiolites have shown anterior fixed defect but no ischemia (12/13, 9/16).  Most recent echo in 9/16 showed EF 20% with moderate RV systolic dysfunction and moderate-severe functional MR.  She has a Medtronic CRT-D device.    Prior to her initial CHF clinic appointment, she had become more short of breath with exertion for several months.  She was still very functional, able to do her ADLs and to grocery shop.  However, she was short of breath with stairs and with heavier exertion like carrying a load.  Generally no problems walking a short distance.  No chest pain.  No orthopnea/PND.  No tachypalpitations.  Of note, she tried to restart digoxin but developed nausea (had this with digoxin in the past) so stopped it.   She returns today from regular visit. Up 5 lbs from last (11/2015).  Weight ranges from 124 - 126 lbs. Gets around great. No DOE. She states she can sometimes be a little be more SOB first thing in the mornings before taking her Lasix. Able to do all her chores, and get around stores. No lightheadedness or dizziness, CP, or Orthopnea. Generally, feels quite good.   CPX  10/16. Showed a significant CHF limitation.  VE/VCO2 slope was elevated out of proportion to the VO2 max.   Optivol: Fluid index below threshold with no recent crossings, thoracic impedance down slightly.  Labs (8/16): TSH normal Labs (9/16): K 4.5, creatinine 1.69, BNP 985 Labs (10/16): K 4.2, creatinine 2.06, BNP 808 Labs (09/16/15): K 4.7, creatinine 1.67 => 2, BNP 960, LDL  54, HDL 48 Labs (9/17): K 4.7, creatinine 1.8, LDL 59, HDL 54  PMH: 1. CHB: s/p Medtronic CRT-D device.  2. 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  3. CKD 4. Hyperlipidemia 5. Chronic systolic CHF: Nonischemic cardiomyopathy.  LHC (2006) with 40-50% LAD stenosis, 30% RCA stenosis.  Cardiolite (12/13) with no ischemia.  Medtronic CRT-D.  Echo (9/16) with EF 20%, severe LV dilation, restrictive diastolic function, moderate-severe MR (suspect functional), normal RV size with moderately decreased systolic function.  Cardiolite (9/16) with EF 16%, anterior scar (same as prior), no ischemia.  CPX (10/16): peak VO2 12 (72% predicted), VE/VCO2 slope 53, RER 1.12, OUES 0.8 => VE/VCO2 slope suggests severe HR limitation out of proportion to peak VO2.  6. Mitral regurgitation: Moderate to severe on 9/16 echo, likely functional.  SH: Lives in [place], widow, retired, no smoking, 2 children, no ETOH.   FH: 1 brother with CHF, 1 brother with history of MI.   ROS: All systems reviewed and negative except as per HPI.   Current Outpatient Prescriptions  Medication Sig Dispense Refill  . Artificial Tear Solution (SOOTHE XP) SOLN Apply 1 drop to eye daily as needed (dry eyes).    . Ascorbic Acid (VITAMIN C) 500 MG tablet Take 500 mg by mouth daily.      Marland Kitchen aspirin 81 MG tablet Take 81 mg by mouth daily.      . Carboxymethylcellul-Glycerin (REFRESH OPTIVE OP) Apply 1 drop to eye at  bedtime.    . carvedilol (COREG) 12.5 MG tablet Take 1 tablet (12.5 mg total) by mouth 2 (two) times daily. 180 tablet 2  . CORLANOR 5 MG TABS tablet TAKE 1 TABLET TWICE A DAY WITH MEALS 180 tablet 1  . diazepam (VALIUM) 5 MG tablet Take 1 tablet (5 mg total) by mouth at bedtime as needed for anxiety. 90 tablet 1  . furosemide (LASIX) 40 MG tablet Take 1 tablet (40 mg total) by mouth daily. 180 tablet 3  . glucose blood (ACCU-CHEK AVIVA) test strip Test two times daily. Code 250.02 100 each 11  . ibandronate  (BONIVA) 150 MG tablet TAKE 1 TABLET ONCE A MONTH 3 tablet 3  . JANUVIA 100 MG tablet TAKE 1 TABLET DAILY 90 tablet 0  . Lancets MISC Test as directed two times daily. Code 250.02 100 each 11  . losartan (COZAAR) 50 MG tablet TAKE 1 TABLET DAILY 90 tablet 2  . metFORMIN (GLUCOPHAGE-XR) 500 MG 24 hr tablet TAKE 2 TABLETS IN THE MORNING 180 tablet 3  . simvastatin (ZOCOR) 20 MG tablet Take 1 tablet (20 mg total) by mouth at bedtime. 90 tablet 0  . spironolactone (ALDACTONE) 25 MG tablet Take 1 tablet (25 mg total) by mouth daily. 90 tablet 2  . Wheat Dextrin (BENEFIBER) POWD Take by mouth. 2 tablespoons once daily     No current facility-administered medications for this encounter.    BP 102/60   Pulse 72   Wt 126 lb 4 oz (57.3 kg)   SpO2 100%   BMI 21.67 kg/m    Wt Readings from Last 3 Encounters:  08/30/16 126 lb 4 oz (57.3 kg)  08/10/16 124 lb (56.2 kg)  06/14/16 126 lb 6.4 oz (57.3 kg)    General: NAD Neck: JVP 5-6 cm, no thyromegaly or nodule noted.  Lungs: CTAB, normal effort CV: Nondisplaced PMI.  Heart regular S1/S2, no S3/S4, 2/6 HSM apex.  No edema.  No carotid bruit.  Normal pedal pulses.  Abdomen: Soft, NT, ND, no HSM. No bruits or masses. +BS  Skin: Intact without lesions or rashes.  Neurologic: Alert and oriented x 3.  Psych: Normal affect. Extremities: No clubbing or cyanosis.  HEENT: Normal.   Assessment/Plan: 1. Chronic systolic CHF: Nonischemic cardiomyopathy. EF 20% on last echo with moderately decreased RV systolic function and moderate to severe functional MR.  Medtronic CRT-D.  CPX showed a probable severe functional limitation due to HF.  NYHA class II symptoms, quite improved after starting Corlanor.   On exam and by Optivol, she is not volume overloaded.  - Continue Lasix 40 mg daily.  BMET stable on 08/10/16. - Continue Corlanor, this has made a big difference with her symptoms.  - She has not tolerated digoxin due to nausea. - Continue current Coreg,  losartan, and spironolactone => with low BP and high creatinine, I will not titrate up Coreg or losartan. 2. CKD III: Stable recently at PCP (K 4.7, Creatinine 1.81 on 08/10/16) 3. Mitral regurgitation: Moderate to severe MR, appears to be functional.  4. HLD: Stable 08/10/16. Continue simvastatin 20 mg daily.   Satira Mccallum Tillery 08/30/2016   Patient seen with PA, agree with the above note.  She is stable symptomatically, overall feeling better since Corlanor begun.  No room to increase Coreg or losartan.    I will see her back in 6 months.  She will be seeing Dr Sallyanne Kuster in 12/17.  Loralie Champagne 08/31/2016

## 2016-08-31 ENCOUNTER — Other Ambulatory Visit: Payer: Self-pay | Admitting: Cardiology

## 2016-08-31 DIAGNOSIS — I10 Essential (primary) hypertension: Secondary | ICD-10-CM

## 2016-08-31 DIAGNOSIS — I5022 Chronic systolic (congestive) heart failure: Secondary | ICD-10-CM

## 2016-09-01 ENCOUNTER — Telehealth: Payer: Self-pay | Admitting: Cardiovascular Disease

## 2016-09-01 NOTE — Telephone Encounter (Signed)
Pt called to make Dec fu appt-needs lab order mailed to her, she had some labs done 08-10-16 by Dr. Laurann Montana if we can check to see if any of that needs to be repeated

## 2016-09-01 NOTE — Telephone Encounter (Signed)
Returned call. Patient had labs done by Dr. Jenny Reichmann on 9/27 including CBC w diff, lipids, liver, BMET, HgA1C  These are in EPIC. She inquired if any labwork needed prior to her Dec appt w Dr. Sallyanne Kuster. I advised likely no, would confirm w MD though. Pt aware I will call back if recommendations for bloodwork.

## 2016-09-05 NOTE — Telephone Encounter (Signed)
Reviewed the lab results. At this point, no plan to repeat labs before the December visit.Priscilla Anderson

## 2016-09-07 ENCOUNTER — Encounter: Payer: Self-pay | Admitting: Internal Medicine

## 2016-09-21 ENCOUNTER — Encounter: Payer: Self-pay | Admitting: Internal Medicine

## 2016-09-21 ENCOUNTER — Ambulatory Visit (INDEPENDENT_AMBULATORY_CARE_PROVIDER_SITE_OTHER): Payer: Medicare Other | Admitting: Internal Medicine

## 2016-09-21 VITALS — BP 122/72 | HR 77 | Temp 98.8°F | Resp 20 | Wt 121.0 lb

## 2016-09-21 DIAGNOSIS — I5022 Chronic systolic (congestive) heart failure: Secondary | ICD-10-CM

## 2016-09-21 DIAGNOSIS — R1013 Epigastric pain: Secondary | ICD-10-CM

## 2016-09-21 DIAGNOSIS — E1129 Type 2 diabetes mellitus with other diabetic kidney complication: Secondary | ICD-10-CM

## 2016-09-21 DIAGNOSIS — I10 Essential (primary) hypertension: Secondary | ICD-10-CM

## 2016-09-21 MED ORDER — SACCHAROMYCES BOULARDII 250 MG PO CAPS
250.0000 mg | ORAL_CAPSULE | Freq: Two times a day (BID) | ORAL | 1 refills | Status: DC
Start: 2016-09-21 — End: 2018-03-27

## 2016-09-21 MED ORDER — PANTOPRAZOLE SODIUM 40 MG PO TBEC: 40.0000 mg | DELAYED_RELEASE_TABLET | Freq: Every day | ORAL | 5 refills | Status: DC

## 2016-09-21 NOTE — Assessment & Plan Note (Signed)
stable overall by history and exam, recent data reviewed with pt, and pt to continue medical treatment as before,  to f/u any worsening symptoms or concerns Lab Results  Component Value Date   HGBA1C 6.7 (H) 08/10/2016

## 2016-09-21 NOTE — Assessment & Plan Note (Signed)
stable overall by history and exam, recent data reviewed with pt, and pt to continue medical treatment as before,  to f/u any worsening symptoms or concerns BP Readings from Last 3 Encounters:  09/21/16 122/72  08/30/16 102/60  08/10/16 100/64

## 2016-09-21 NOTE — Assessment & Plan Note (Signed)
stable overall by history and exam, and pt to continue medical treatment as before,  to f/u any worsening symptoms or concerns 

## 2016-09-21 NOTE — Progress Notes (Signed)
Pre visit review using our clinic review tool, if applicable. No additional management support is needed unless otherwise documented below in the visit note. 

## 2016-09-21 NOTE — Progress Notes (Signed)
Subjective:    Patient ID: Priscilla Anderson, female    DOB: November 05, 1934, 82 y.o.   MRN: [id number]  HPI  Here with acute visit for 1-2 wk ongoing GI complaints of epigastric discomfort, nausea, bloating, gas feeling, belching and mild worsening reflux, and borborygmi but denies other abd pain, dysphagia, n/v, bowel change or blood.  Has tried pepto bismol which may have helped somewhat but also make stools black, Also has some wt loss due to trying to stick with bland diet of potato soup and crackers as this seems to lessen the symptoms.  Has not tried antacid.  Denies constipation, fever, vomiting  Denies dysphagia, or blood.  Nothing else makes better or worse. Pt denies chest pain, increased sob or doe, wheezing, orthopnea, PND, increased LE swelling, palpitations, or syncope.  Pt denies polydipsia, polyuria,     Past Medical History:  Diagnosis Date  . Adenomatous colon polyp   . AICD (automatic cardioverter/defibrillator) present   . CAD (coronary artery disease)   . CHF (congestive heart failure) ([place])   . Hyperlipidemia   . Hypertension   . Nonischemic cardiomyopathy ([place]) 09/22/2011  . Osteopenia   . Polyp of larynx   . Type II diabetes mellitus ([place])    Past Surgical History:  Procedure Laterality Date  . BIV ICD GENERTAOR CHANGE OUT N/A 07/29/2014   Procedure: BIV ICD GENERTAOR CHANGE OUT;  Surgeon: Sanda Klein, MD;  Location: [facility];  Service: Cardiovascular;  Laterality: N/A;  . BREAST CYST EXCISION Left 1980  . CARDIAC CATHETERIZATION  07/28/2005   noncritical CAD  . CARDIAC CATHETERIZATION     "I've had several"  . CARDIAC DEFIBRILLATOR PLACEMENT  05/21/2002   s/p re-do pacemaker/defibrillator October 2011- [facility].  . COLONOSCOPY    . IMPLANTABLE CARDIOVERTER DEFIBRILLATOR GENERATOR CHANGE  09/01/2010   Medtronic  . MICROLARYNGOSCOPY WITH CO2 LASER AND EXCISION OF VOCAL CORD LESION  08/2002   Archie Endo 03/29/2011  . VAGINAL HYSTERECTOMY      reports that  she has never smoked. She has never used smokeless tobacco. She reports that she uses drugs about 2 times per week. She reports that she does not drink alcohol. family history includes Arthritis in her father and mother; Diabetes in her other; Heart disease in her brother; Prostate cancer in her brother. Allergies  Allergen Reactions  . Lanoxin [Digoxin] Nausea Only    Nausea and wgt loss   Current Outpatient Prescriptions on File Prior to Visit  Medication Sig Dispense Refill  . Artificial Tear Solution (SOOTHE XP) SOLN Apply 1 drop to eye daily as needed (dry eyes).    . Ascorbic Acid (VITAMIN C) 500 MG tablet Take 500 mg by mouth daily.      Marland Kitchen aspirin 81 MG tablet Take 81 mg by mouth daily.      . Carboxymethylcellul-Glycerin (REFRESH OPTIVE OP) Apply 1 drop to eye at bedtime.    . carvedilol (COREG) 12.5 MG tablet Take 1 tablet (12.5 mg total) by mouth 2 (two) times daily. 180 tablet 2  . CORLANOR 5 MG TABS tablet TAKE 1 TABLET TWICE A DAY WITH MEALS 180 tablet 1  . diazepam (VALIUM) 5 MG tablet Take 1 tablet (5 mg total) by mouth at bedtime as needed for anxiety. 90 tablet 1  . furosemide (LASIX) 40 MG tablet Take 1 tablet (40 mg total) by mouth daily. 90 tablet 3  . glucose blood (ACCU-CHEK AVIVA) test strip Test two times daily. Code 250.02 100  each 11  . ibandronate (BONIVA) 150 MG tablet TAKE 1 TABLET ONCE A MONTH 3 tablet 3  . JANUVIA 100 MG tablet TAKE 1 TABLET DAILY 90 tablet 0  . Lancets MISC Test as directed two times daily. Code 250.02 100 each 11  . losartan (COZAAR) 50 MG tablet TAKE 1 TABLET DAILY 90 tablet 2  . metFORMIN (GLUCOPHAGE-XR) 500 MG 24 hr tablet TAKE 2 TABLETS IN THE MORNING 180 tablet 3  . simvastatin (ZOCOR) 20 MG tablet Take 1 tablet (20 mg total) by mouth at bedtime. 90 tablet 0  . spironolactone (ALDACTONE) 25 MG tablet Take 1 tablet (25 mg total) by mouth daily. 90 tablet 2  . Wheat Dextrin (BENEFIBER) POWD Take by mouth. 2 tablespoons once daily     No  current facility-administered medications on file prior to visit.    Review of Systems  Constitutional: Negative for unusual diaphoresis or night sweats HENT: Negative for ear swelling or discharge Eyes: Negative for worsening visual haziness  Respiratory: Negative for choking and stridor.   Genitourinary: Negative for retention or change in urine volume.  Musculoskeletal: Negative for other MSK pain or swelling Skin: Negative for color change and worsening wound Neurological: Negative for tremors and numbness other than noted  Psychiatric/Behavioral: Negative for decreased concentration or agitation other than above   All other system neg per pt    Objective:   Physical Exam BP 122/72   Pulse 77   Temp 98.8 F (37.1 C) (Oral)   Resp 20   Wt 121 lb (54.9 kg)   SpO2 97%   BMI 20.77 kg/m  VS noted, not ill appearing or toxic, but uncomfortable Constitutional: Pt appears in no apparent distress HENT: Head: NCAT.  Right Ear: External ear normal.  Left Ear: External ear normal.  Eyes: . Pupils are equal, round, and reactive to light. Conjunctivae and EOM are normal Neck: Normal range of motion. Neck supple.  Cardiovascular: Normal rate and regular rhythm.   Pulmonary/Chest: Effort normal and breath sounds without rales or wheezing.  Abd:  Soft, NT, ? Slight distended, + BS, o/w benign exam Neurological: Pt is alert. Not confused , motor grossly intact Skin: Skin is warm. No rash, no LE edema Psychiatric: Pt behavior is normal. No agitation.     Assessment & Plan:

## 2016-09-21 NOTE — Assessment & Plan Note (Signed)
Etiology unclear, possible gastritis, is afeb, VSS, exam without severe changes, will hold on lab eval, but for trial PPI and probiotic, d/c peptobismol

## 2016-09-21 NOTE — Patient Instructions (Signed)
Please take all new medication as prescribed - the protonix 40 mg  You can also take TUMS as needed for acid  Please also try the Florastor twice per day (probiotic)  Please continue all other medications as before, and refills have been done if requested.  Please have the pharmacy call with any other refills you may need.  Please keep your appointments with your specialists as you may have planned

## 2016-09-25 ENCOUNTER — Other Ambulatory Visit: Payer: Self-pay | Admitting: Cardiovascular Disease

## 2016-10-13 ENCOUNTER — Telehealth: Payer: Self-pay

## 2016-10-13 MED ORDER — PANTOPRAZOLE SODIUM 40 MG PO TBEC: 40.0000 mg | DELAYED_RELEASE_TABLET | Freq: Every day | ORAL | 5 refills | Status: DC

## 2016-10-13 NOTE — Telephone Encounter (Signed)
Medication refill sent to pharmacy  

## 2016-10-19 MED ORDER — PANTOPRAZOLE SODIUM 40 MG PO TBEC: 40.0000 mg | DELAYED_RELEASE_TABLET | Freq: Every day | ORAL | 1 refills | Status: DC

## 2016-10-19 NOTE — Addendum Note (Signed)
Addended by: Aviva Signs M on: 10/19/2016 05:17 PM   Modules accepted: Orders

## 2016-10-19 NOTE — Telephone Encounter (Signed)
rq for 90 day supply of pantoprazole. erx sent.

## 2016-10-21 ENCOUNTER — Telehealth: Payer: Self-pay

## 2016-10-21 NOTE — Telephone Encounter (Signed)
Patient notified of recommendations.  She is fine cancelling colon and pre-visit. She will call back for any GI concerns

## 2016-10-21 NOTE — Telephone Encounter (Signed)
-----   Message from Ladene Artist, MD sent at 10/21/2016  8:49 AM EST ----- Given age and EF=20% I recommend cancelling surveillance colonoscopies. Office visit if she wants to discuss further.   ----- Message ----- From: Grant Fontana, RN Sent: 10/21/2016   8:26 AM To: Marlon Pel, RN, Ladene Artist, MD  EF 20%  Please schedule with hospital or MD per MD's instruction.                                                                                        Thank you,                                                                                          Angela/PV

## 2016-10-24 ENCOUNTER — Telehealth: Payer: Self-pay

## 2016-10-24 ENCOUNTER — Ambulatory Visit (INDEPENDENT_AMBULATORY_CARE_PROVIDER_SITE_OTHER): Payer: Medicare Other | Admitting: Cardiovascular Disease

## 2016-10-24 ENCOUNTER — Encounter: Payer: Self-pay | Admitting: Cardiovascular Disease

## 2016-10-24 VITALS — BP 91/61 | HR 71 | Ht 64.0 in | Wt 124.2 lb

## 2016-10-24 DIAGNOSIS — I429 Cardiomyopathy, unspecified: Secondary | ICD-10-CM

## 2016-10-24 DIAGNOSIS — I340 Nonrheumatic mitral (valve) insufficiency: Secondary | ICD-10-CM | POA: Diagnosis not present

## 2016-10-24 DIAGNOSIS — I5022 Chronic systolic (congestive) heart failure: Secondary | ICD-10-CM

## 2016-10-24 DIAGNOSIS — Z95810 Presence of automatic (implantable) cardiac defibrillator: Secondary | ICD-10-CM

## 2016-10-24 DIAGNOSIS — N183 Chronic kidney disease, stage 3 unspecified: Secondary | ICD-10-CM

## 2016-10-24 DIAGNOSIS — E7800 Pure hypercholesterolemia, unspecified: Secondary | ICD-10-CM

## 2016-10-24 DIAGNOSIS — E1129 Type 2 diabetes mellitus with other diabetic kidney complication: Secondary | ICD-10-CM

## 2016-10-24 DIAGNOSIS — I420 Dilated cardiomyopathy: Secondary | ICD-10-CM

## 2016-10-24 NOTE — Patient Instructions (Signed)
You will receive a phone call next Monday, 10/31/2016, to speak with a nurse and send a transmission of your device.  Dr Sallyanne Kuster recommends that you continue on your current medications as directed. Please refer to the Current Medication list given to you today.  Your physician recommends that you return for lab work at your convenience.  Remote monitoring is used to monitor your Pacemaker of ICD from home. This monitoring reduces the number of office visits required to check your device to one time per year. It allows [place] to keep an eye on the functioning of your device to ensure it is working properly. You are scheduled for a device check from home on Monday, March 12th, 2018. You may send your transmission at any time that day. If you have a wireless device, the transmission will be sent automatically. After your physician reviews your transmission, you will receive a postcard with your next transmission date.  Dr Sallyanne Kuster recommends that you schedule a follow-up appointment in 6 months with a defibrillator check. You will receive a reminder letter in the mail two months in advance. If you don't receive a letter, please call our office to schedule the follow-up appointment.  If you need a refill on your cardiac medications before your next appointment, please call your pharmacy.

## 2016-10-24 NOTE — Telephone Encounter (Signed)
Received call from Dr Victorino December nurse, Enid Cutter.  ICM remote transmission needed for 10/31/2016, one time check only.  At this time does not need to be followed in [facility] clinic monthly.

## 2016-10-24 NOTE — Progress Notes (Signed)
Patient ID: Priscilla Anderson, female   DOB: [birth year], 82 y.o.   MRN: [id number]    Cardiology Office Note    Date:  10/25/2016   ID:  Priscilla, Anderson 02/28/1934, MRN [id number]  PCP:  Priscilla Cower, MD  Cardiologist: Priscilla Champagne, MD; Priscilla Klein, MD   Chief Complaint  Patient presents with  . Follow-up    pt has no complaints today     History of Present Illness:  Priscilla Anderson is a 82 y.o. female with long-standing nonischemic dilated cardiomyopathy (EF 20% Sep 2016) and severely depressed left ventricular systolic function, moderate-severe mitral regurgitation, mild and nonobstructive CAD  and a biventricular pacemaker/defibrillator who returns in follow-up. She sees Dr. Aundra Anderson in the heart failure clinic.   Denies angina, edema, syncope, weakness/dizziness, palpitations, focal neurological events. She performs her own housework without much limitation. Her blood pressure is low but she tolerates it well, as has been the case in the past.  Device interrogation (Medtronic Viva XT CRT-D) today shows normal BiV-ICD function with an estimated generator longevity of 2.5 years. Lead parameters are favorable and stable. Activity level is about 1.2 hours per day, Slight decrease which she attributes to the cold weather. Optivol shows evidence of fluid overload, starting around Thanksgiving and she has noticed about a 4-5 pound weight gain. She has >96% biventricular pacing And 57% atrial pacing. She has not had any atrial tachycardia or atrial fibrillation.  No episodes of VT/VF, neither in the treatment or the monitor zone.   Past Medical History:  Diagnosis Date  . Adenomatous colon polyp   . AICD (automatic cardioverter/defibrillator) present   . CAD (coronary artery disease)   . CHF (congestive heart failure) (Priscilla Anderson)   . Hyperlipidemia   . Hypertension   . Nonischemic cardiomyopathy (Priscilla Anderson) 09/22/2011  . Osteopenia   . Polyp of larynx   . Type II diabetes mellitus (Priscilla Anderson)      Past Surgical History:  Procedure Laterality Date  . BIV ICD GENERTAOR CHANGE OUT N/A 07/29/2014   Procedure: BIV ICD GENERTAOR CHANGE OUT;  Surgeon: Priscilla Klein, MD;  Location: [place] [facility] CATH LAB;  Service: Cardiovascular;  Laterality: N/A;  . BREAST CYST EXCISION Left 1980  . CARDIAC CATHETERIZATION  07/28/2005   noncritical CAD  . CARDIAC CATHETERIZATION     "I've had several"  . CARDIAC DEFIBRILLATOR PLACEMENT  05/21/2002   s/p re-do pacemaker/defibrillator October 2011- [place].  . COLONOSCOPY    . IMPLANTABLE CARDIOVERTER DEFIBRILLATOR GENERATOR CHANGE  09/01/2010   Medtronic  . MICROLARYNGOSCOPY WITH CO2 LASER AND EXCISION OF VOCAL CORD LESION  08/2002   Priscilla Anderson 03/29/2011  . VAGINAL HYSTERECTOMY      Outpatient Medications Prior to Visit  Medication Sig Dispense Refill  . Artificial Tear Solution (SOOTHE XP) SOLN Apply 1 drop to eye daily as needed (dry eyes).    . Ascorbic Acid (VITAMIN C) 500 MG tablet Take 500 mg by mouth daily.      Marland Kitchen aspirin 81 MG tablet Take 81 mg by mouth daily.      . Carboxymethylcellul-Glycerin (REFRESH OPTIVE OP) Apply 1 drop to eye at bedtime.    . carvedilol (COREG) 12.5 MG tablet Take 1 tablet (12.5 mg total) by mouth 2 (two) times daily. 180 tablet 2  . CORLANOR 5 MG TABS tablet TAKE 1 TABLET TWICE A DAY WITH MEALS 180 tablet 1  . diazepam (VALIUM) 5 MG tablet Take 1 tablet (5 mg total) by mouth at bedtime as  needed for anxiety. 90 tablet 1  . furosemide (LASIX) 40 MG tablet Take 1 tablet (40 mg total) by mouth daily. 90 tablet 3  . glucose blood (ACCU-CHEK AVIVA) test strip Test two times daily. Code 250.02 100 each 11  . ibandronate (BONIVA) 150 MG tablet TAKE 1 TABLET ONCE A MONTH 3 tablet 3  . Lancets MISC Test as directed two times daily. Code 250.02 100 each 11  . losartan (COZAAR) 50 MG tablet TAKE 1 TABLET DAILY 90 tablet 2  . metFORMIN (GLUCOPHAGE-XR) 500 MG 24 hr tablet TAKE 2 TABLETS IN THE MORNING 180 tablet 3  . pantoprazole  (PROTONIX) 40 MG tablet Take 1 tablet (40 mg total) by mouth daily. 90 tablet 1  . saccharomyces boulardii (FLORASTOR) 250 MG capsule Take 1 capsule (250 mg total) by mouth 2 (two) times daily. 60 capsule 1  . simvastatin (ZOCOR) 20 MG tablet TAKE 1 TABLET AT BEDTIME 90 tablet 0  . spironolactone (ALDACTONE) 25 MG tablet Take 1 tablet (25 mg total) by mouth daily. 90 tablet 2  . Wheat Dextrin (BENEFIBER) POWD Take by mouth. 2 tablespoons once daily    . JANUVIA 100 MG tablet TAKE 1 TABLET DAILY 90 tablet 0   No facility-administered medications prior to visit.      Allergies:   Lanoxin [digoxin]   Social History   Social History  . Marital status: Widowed    Spouse name: N/A  . Number of children: 2  . Years of education: N/A   Occupational History  . retired Investment banker, corporate. report clerk Retired   Social History Main Topics  . Smoking status: Never Smoker  . Smokeless tobacco: Never Used  . Alcohol use No  . Drug use:     Frequency: 2.0 times per week  . Sexual activity: Not Asked   Other Topics Concern  . None   Social History Narrative   Husband now with Multiple Myeloma- see Dr. Juanita Craver     Family History:  The patient's family history includes Arthritis in her father and mother; Diabetes in her other; Heart disease in her brother; Prostate cancer in her brother.   ROS:   Please see the history of present illness.    ROS All other systems reviewed and are negative.   PHYSICAL EXAM:   VS:  BP 91/61 (BP Location: Right Arm, Patient Position: Sitting, Cuff Size: Normal)   Pulse 71   Ht 5' 4"  (1.626 m)   Wt 124 lb 3.2 oz (56.3 kg)   SpO2 96%   BMI 21.32 kg/m    GEN: Well nourished, well developed, in no acute distress  HEENT: normal  Neck: no JVD, carotid bruits, or masses Cardiac: Laterally displaced apical impulse, paradoxically split S2, RRR; grade 1/6 holosystolic apical murmur no diastolic murmurs, rubs, or gallops,no edema  Respiratory:  clear  to auscultation bilaterally, normal work of breathing GI: soft, nontender, nondistended, + BS MS: no deformity or atrophy  Skin: warm and dry, no rash Neuro:  Alert and Oriented x 3, Strength and sensation are intact Psych: euthymic mood, full affect  Wt Readings from Last 3 Encounters:  10/24/16 124 lb 3.2 oz (56.3 kg)  09/21/16 121 lb (54.9 kg)  08/30/16 126 lb 4 oz (57.3 kg)      Studies/Labs Reviewed:   EKG:  EKG is not ordered today.  The intracardiac recording today demonstrates atrial sensed-biventricular paced rhythm  Recent Labs: 12/03/2015: B Natriuretic Peptide 982.7 08/10/2016: ALT 8; BUN 28;  Creatinine, Ser 1.81; Hemoglobin 13.2; Platelets 186.0; Potassium 4.7; Sodium 140   Lipid Panel    Component Value Date/Time   CHOL 132 08/10/2016 1113   TRIG 94.0 08/10/2016 1113   HDL 53.90 08/10/2016 1113   CHOLHDL 2 08/10/2016 1113   VLDL 18.8 08/10/2016 1113   LDLCALC 59 08/10/2016 1113    Additional studies/ records that were reviewed today include:  Heart Failure clinic notes    ASSESSMENT:    1. Chronic systolic congestive heart failure, NYHA class 1 ([place])   2. Nonischemic dilated cardiomyopathy ([place])   3. Moderate mitral regurgitation by prior echocardiogram   4. Biventricular ICD (implantable cardioverter-defibrillator) in place   5. CKD (chronic kidney disease) stage 3, GFR 30-59 ml/min   6. Pure hypercholesterolemia   7. Type 2 diabetes mellitus with renal manifestations not at goal [facility])      PLAN:  In order of problems listed above:  1. CHF: Clinically euvolemic, but elevated Optivol since Thanksgiving.  NYHA functional class I-II.  Her blood pressure is low and no changes made to medications or diuretic prescription she is on comprehensive medical therapy with a maximum tolerated dose of beta blocker, angiotensin receptor blocker, spironolactone (limited by blood pressure) as well as low dose loop diuretic. Have asked her to be especially  conscientious of limiting sodium in her diet and will perform another device transmission next week to make sure that the volume load improves. 2. CMP:  EF 20% by echo, 17% by scintigraphy. Moderate nonobstructive coronary artery disease by previous cath. Anterior scar without ischemia on nuclear study recently 3. MR: Suspected to be secondary to cardiomyopathy, since severity waxes and wanes with heart failure exacerbation. 4. CRT-D: She has never required treatment from her device. BiV pacing efficiency is 96. CareLink download every 3 months and office visit in 6 months. 5. CKD stage 3-4: Monitor frequently , lab request slip given today. 6. HLP: Excellent recent lipid profile  7. DM: Improve glycemic control, most recent hemoglobin A1c almost at goal 7.1%  .     Medication Adjustments/Labs and Tests Ordered: Current medicines are reviewed at length with the patient today.  Concerns regarding medicines are outlined above.  Medication changes, Labs and Tests ordered today are listed in the Patient Instructions below. Patient Instructions  You will receive a phone call next Monday, 10/31/2016, to speak with a nurse and send a transmission of your device.  Dr Sallyanne Kuster recommends that you continue on your current medications as directed. Please refer to the Current Medication list given to you today.  Your physician recommends that you return for lab work at your convenience.  Remote monitoring is used to monitor your Pacemaker of ICD from home. This monitoring reduces the number of office visits required to check your device to one time per year. It allows [place] to keep an eye on the functioning of your device to ensure it is working properly. You are scheduled for a device check from home on Monday, March 12th, 2018. You may send your transmission at any time that day. If you have a wireless device, the transmission will be sent automatically. After your physician reviews your transmission, you  will receive a postcard with your next transmission date.  Dr Sallyanne Kuster recommends that you schedule a follow-up appointment in 6 months with a defibrillator check. You will receive a reminder letter in the mail two months in advance. If you don't receive a letter, please call our office to schedule the follow-up  appointment.  If you need a refill on your cardiac medications before your next appointment, please call your pharmacy. Signed, Priscilla Klein, MD  10/25/2016 3:51 PM    [address], [place], [place]  [address] Phone: [phone]; Fax: [phone]

## 2016-10-25 ENCOUNTER — Other Ambulatory Visit: Payer: Self-pay | Admitting: Internal Medicine

## 2016-10-25 DIAGNOSIS — N183 Chronic kidney disease, stage 3 unspecified: Secondary | ICD-10-CM | POA: Insufficient documentation

## 2016-10-31 ENCOUNTER — Ambulatory Visit (INDEPENDENT_AMBULATORY_CARE_PROVIDER_SITE_OTHER): Payer: Medicare Other

## 2016-10-31 ENCOUNTER — Telehealth: Payer: Self-pay

## 2016-10-31 DIAGNOSIS — I5022 Chronic systolic (congestive) heart failure: Secondary | ICD-10-CM

## 2016-10-31 DIAGNOSIS — Z95810 Presence of automatic (implantable) cardiac defibrillator: Secondary | ICD-10-CM | POA: Diagnosis not present

## 2016-10-31 NOTE — Progress Notes (Signed)
Getting better, agree with plan.

## 2016-10-31 NOTE — Progress Notes (Signed)
EPIC Encounter for ICM Monitoring  Patient Name: Priscilla Anderson is a 81 y.o. female Date: 10/31/2016 Primary Care Physican: Cathlean Cower, MD Primary Cardiologist: Aundra Dubin Electrophysiologist: Croitoru Dry Weight: unknown Bi-V Pacing: 96.4%       One time ICM call to patient as requested by MD.  Heart Failure questions reviewed, pt asymptomatic   Thoracic impedance abnormal suggesting fluid accumulation but showing improvement since office visit check last week.    Recommendations: No changes.  Patient stated she has stopped eating all the high salty foods.  She had been eating ham, potato chips and other foods that she knew she should not been eating.  Explained by decreasing the salt intake in her diet, the fluid level is improving.  She plans on staying on low salt diet.    Follow-up plan:  No further follow up.  Copy of ICM check sent to primary cardiologist and device physician.   ICM trend: 10/31/2016       Rosalene Billings, RN 10/31/2016 11:09 AM

## 2016-10-31 NOTE — Telephone Encounter (Signed)
See ICM note 

## 2016-10-31 NOTE — Telephone Encounter (Signed)
ICM call to patient to assist sending ICM remote transmission.  Unable to send due to error received on monitor.  Provided tech service number and advised will call her back for update.

## 2016-10-31 NOTE — Progress Notes (Signed)
Thank you :)

## 2016-11-01 LAB — BASIC METABOLIC PANEL
BUN: 39 mg/dL — ABNORMAL HIGH (ref 7–25)
CO2: 30 mmol/L (ref 20–31)
Calcium: 9.4 mg/dL (ref 8.6–10.4)
Chloride: 102 mmol/L (ref 98–110)
Creat: 1.96 mg/dL — ABNORMAL HIGH (ref 0.60–0.88)
GLUCOSE: 98 mg/dL (ref 65–99)
POTASSIUM: 4.2 mmol/L (ref 3.5–5.3)
SODIUM: 142 mmol/L (ref 135–146)

## 2016-11-01 LAB — BRAIN NATRIURETIC PEPTIDE: BRAIN NATRIURETIC PEPTIDE: 792.5 pg/mL — AB (ref <100)

## 2016-11-02 ENCOUNTER — Ambulatory Visit (INDEPENDENT_AMBULATORY_CARE_PROVIDER_SITE_OTHER): Payer: Medicare Other | Admitting: Internal Medicine

## 2016-11-02 ENCOUNTER — Encounter: Payer: Self-pay | Admitting: Internal Medicine

## 2016-11-02 ENCOUNTER — Telehealth: Payer: Self-pay | Admitting: Internal Medicine

## 2016-11-02 VITALS — BP 100/60 | HR 77 | Temp 98.0°F | Resp 20 | Wt 120.0 lb

## 2016-11-02 DIAGNOSIS — I10 Essential (primary) hypertension: Secondary | ICD-10-CM

## 2016-11-02 DIAGNOSIS — E1129 Type 2 diabetes mellitus with other diabetic kidney complication: Secondary | ICD-10-CM | POA: Diagnosis not present

## 2016-11-02 DIAGNOSIS — R1013 Epigastric pain: Secondary | ICD-10-CM

## 2016-11-02 MED ORDER — PANTOPRAZOLE SODIUM 40 MG PO TBEC: 40.0000 mg | DELAYED_RELEASE_TABLET | Freq: Two times a day (BID) | ORAL | 3 refills | Status: DC

## 2016-11-02 NOTE — Patient Instructions (Addendum)
Please increase the protonix to 40 mg twice per day  If not helping, we could consider Dexilant instead  You will be contacted regarding the referral for: Gastroenterology  OK to take the metformin at 1 per day as you have been doing  Please continue all other medications as before, and refills have been done if requested.  Please have the pharmacy call with any other refills you may need.  Please continue your efforts at being more active, low cholesterol diabetic diet, and weight control.  Please keep your appointments with your specialists as you may have planned

## 2016-11-02 NOTE — Progress Notes (Signed)
Pre visit review using our clinic review tool, if applicable. No additional management support is needed unless otherwise documented below in the visit note. 

## 2016-11-02 NOTE — Telephone Encounter (Signed)
Please send [facility]oprazole [facility] than [facility]

## 2016-11-02 NOTE — Progress Notes (Signed)
Subjective:    Patient ID: Priscilla Anderson, female    DOB: Jul 16, 1934, 82 y.o.   MRN: [id number]  HPI  Here after seen nov 8 with with dyspeptic symtpoms, tx with protnoix, tums, and florastor, only thing that seems to help is the [place], and has been taking quite a bit every day since then except incidentally yesterday and today not so much.  Still with epigastric funny feeling/discomofrt, intermittent, mild to mod, no raidation, but + nausea with eating, but no vomiting. Appetite has been less lately, kind of afraid to eat.  Has lost some wt? Nothing else seems to make better or worse.  Wonders if she needs an ultrasound.    Wt Readings from Last 3 Encounters:  11/02/16 120 lb (54.4 kg)  10/24/16 124 lb 3.2 oz (56.3 kg)  09/21/16 121 lb (54.9 kg)  Wants to stop taking the TUMS all the time, wondering if anything else better.  Has not tried dexilant. Denies worsening reflux, other abd pain, dysphagia, bowel change or blood, no constipation per pt. Has daily BM usually loose but has been less loose today, no straining.  . No abd discomfort at the moment and has incidentally felt better in last 2 days but not sure why.  No fever, Denies urinary symptoms such as dysuria, frequency, urgency, flank pain, hematuria or n/v, hills. Pt denies chest pain, increased sob or doe, wheezing, orthopnea, PND, increased LE swelling, palpitations, dizziness or syncope.   Pt denies polydipsia, polyuria,, and mentions she only takes one of the metformin for last several yrs, though med list states 2 per day Past Medical History:  Diagnosis Date  . Adenomatous colon polyp   . AICD (automatic cardioverter/defibrillator) present   . CAD (coronary artery disease)   . CHF (congestive heart failure) ([place])   . Hyperlipidemia   . Hypertension   . Nonischemic cardiomyopathy ([place]) 09/22/2011  . Osteopenia   . Polyp of larynx   . Type II diabetes mellitus ([place])    Past Surgical History:  Procedure Laterality Date  . BIV  ICD GENERTAOR CHANGE OUT N/A 07/29/2014   Procedure: BIV ICD GENERTAOR CHANGE OUT;  Surgeon: Sanda Klein, MD;  Location: [place] CATH LAB;  Service: Cardiovascular;  Laterality: N/A;  . BREAST CYST EXCISION Left 1980  . CARDIAC CATHETERIZATION  07/28/2005   noncritical CAD  . CARDIAC CATHETERIZATION     "I've had several"  . CARDIAC DEFIBRILLATOR PLACEMENT  05/21/2002   s/p re-do pacemaker/defibrillator October 2011- [place].  . COLONOSCOPY    . IMPLANTABLE CARDIOVERTER DEFIBRILLATOR GENERATOR CHANGE  09/01/2010   Medtronic  . MICROLARYNGOSCOPY WITH CO2 LASER AND EXCISION OF VOCAL CORD LESION  08/2002   Archie Endo 03/29/2011  . VAGINAL HYSTERECTOMY      reports that she has never smoked. She has never used smokeless tobacco. She reports that she uses drugs about 2 times per week. She reports that she does not drink alcohol. family history includes Arthritis in her father and mother; Diabetes in her other; Heart disease in her brother; Prostate cancer in her brother. Allergies  Allergen Reactions  . Lanoxin [Digoxin] Nausea Only    Nausea and wgt loss   Current Outpatient Prescriptions on File Prior to Visit  Medication Sig Dispense Refill  . Artificial Tear Solution (SOOTHE XP) SOLN Apply 1 drop to eye daily as needed (dry eyes).    . Ascorbic Acid (VITAMIN C) 500 MG tablet Take 500 mg by mouth daily.      Marland Kitchen  aspirin 81 MG tablet Take 81 mg by mouth daily.      . Carboxymethylcellul-Glycerin (REFRESH OPTIVE OP) Apply 1 drop to eye at bedtime.    . carvedilol (COREG) 12.5 MG tablet Take 1 tablet (12.5 mg total) by mouth 2 (two) times daily. 180 tablet 2  . CORLANOR 5 MG TABS tablet TAKE 1 TABLET TWICE A DAY WITH MEALS 180 tablet 1  . diazepam (VALIUM) 5 MG tablet Take 1 tablet (5 mg total) by mouth at bedtime as needed for anxiety. 90 tablet 1  . furosemide (LASIX) 40 MG tablet Take 1 tablet (40 mg total) by mouth daily. 90 tablet 3  . glucose blood (ACCU-CHEK AVIVA) test strip Test two  times daily. Code 250.02 100 each 11  . ibandronate (BONIVA) 150 MG tablet TAKE 1 TABLET ONCE A MONTH 3 tablet 3  . JANUVIA 100 MG tablet TAKE 1 TABLET DAILY 90 tablet 0  . Lancets MISC Test as directed two times daily. Code 250.02 100 each 11  . losartan (COZAAR) 50 MG tablet TAKE 1 TABLET DAILY 90 tablet 2  . metFORMIN (GLUCOPHAGE-XR) 500 MG 24 hr tablet TAKE 2 TABLETS IN THE MORNING 180 tablet 3  . pantoprazole (PROTONIX) 40 MG tablet Take 1 tablet (40 mg total) by mouth daily. 90 tablet 1  . saccharomyces boulardii (FLORASTOR) 250 MG capsule Take 1 capsule (250 mg total) by mouth 2 (two) times daily. 60 capsule 1  . simvastatin (ZOCOR) 20 MG tablet TAKE 1 TABLET AT BEDTIME 90 tablet 0  . spironolactone (ALDACTONE) 25 MG tablet Take 1 tablet (25 mg total) by mouth daily. 90 tablet 2  . Wheat Dextrin (BENEFIBER) POWD Take by mouth. 2 tablespoons once daily     No current facility-administered medications on file prior to visit.    Review of Systems  Constitutional: Negative for unusual diaphoresis or night sweats HENT: Negative for ear swelling or discharge Eyes: Negative for worsening visual haziness  Respiratory: Negative for choking and stridor.   Gastrointestinal: Negative for distension or worsening eructation Genitourinary: Negative for retention or change in urine volume.  Musculoskeletal: Negative for other MSK pain or swelling Skin: Negative for color change and worsening wound Neurological: Negative for tremors and numbness other than noted  Psychiatric/Behavioral: Negative for decreased concentration or agitation other than above   All other system neg per pt    Objective:   Physical Exam BP 100/60   Pulse 77   Temp 98 F (36.7 C) (Oral)   Resp 20   Wt 120 lb (54.4 kg)   SpO2 97%   BMI 20.60 kg/m  VS noted,  Constitutional: Pt appears in no apparent distress HENT: Head: NCAT.  Right Ear: External ear normal.  Left Ear: External ear normal.  Eyes: . Pupils are  equal, round, and reactive to light. Conjunctivae and EOM are normal Neck: Normal range of motion. Neck supple.  Cardiovascular: Normal rate and regular rhythm.   Pulmonary/Chest: Effort normal and breath sounds without rales or wheezing.  Abd:  Soft, NT, ND, + BS - benign, no flank tender Neurological: Pt is alert. Not confused , motor grossly intact Skin: Skin is warm. No rash, no LE edema Psychiatric: Pt behavior is normal. No agitation.  No other new exam findings    Assessment & Plan:

## 2016-11-02 NOTE — Telephone Encounter (Signed)
This has been done.

## 2016-11-06 NOTE — Assessment & Plan Note (Signed)
Etiology unclear but suspect peptic, ok for increased protonix 40 bid, consider dexilant if not improved and if affordable,  to f/u any worsening symptoms or concerns, will check labs and o/w will hold on further imaging for now Lab Results  Component Value Date   WBC 6.4 08/10/2016   HGB 13.2 08/10/2016   HCT 39.7 08/10/2016   PLT 186.0 08/10/2016   GLUCOSE 98 10/31/2016   CHOL 132 08/10/2016   TRIG 94.0 08/10/2016   HDL 53.90 08/10/2016   LDLCALC 59 08/10/2016   ALT 8 08/10/2016   AST 15 08/10/2016   NA 142 10/31/2016   K 4.2 10/31/2016   CL 102 10/31/2016   CREATININE 1.96 (H) 10/31/2016   BUN 39 (H) 10/31/2016   CO2 30 10/31/2016   TSH 1.52 07/09/2015   INR 1.02 07/22/2014   HGBA1C 6.7 (H) 08/10/2016   MICROALBUR <0.7 03/05/2015

## 2016-11-06 NOTE — Assessment & Plan Note (Signed)
stable overall by history and exam, recent data reviewed with pt, and pt to continue medical treatment as before,  to f/u any worsening symptoms or concerns Lab Results  Component Value Date   HGBA1C 6.7 (H) 08/10/2016   OK to cont the metformin at 1 per day as she has been doing

## 2016-11-06 NOTE — Assessment & Plan Note (Signed)
stable overall by history and exam, recent data reviewed with pt, and pt to continue medical treatment as before,  to f/u any worsening symptoms or concerns BP Readings from Last 3 Encounters:  11/02/16 100/60  10/24/16 91/61  09/21/16 122/72

## 2016-11-09 ENCOUNTER — Encounter: Payer: Medicare Other | Admitting: Gastroenterology

## 2016-11-15 ENCOUNTER — Other Ambulatory Visit: Payer: Self-pay | Admitting: Cardiovascular Disease

## 2016-11-15 NOTE — Telephone Encounter (Signed)
Rx request sent to pharmacy.  

## 2016-11-19 ENCOUNTER — Other Ambulatory Visit: Payer: Self-pay | Admitting: Cardiology

## 2016-11-23 LAB — CUP PACEART INCLINIC DEVICE CHECK
Brady Statistic AP VP Percent: 57.22 %
Brady Statistic AP VS Percent: 0.24 %
Brady Statistic AS VP Percent: 41.38 %
Brady Statistic AS VS Percent: 1.16 %
Date Time Interrogation Session: 20171211220127
HIGH POWER IMPEDANCE MEASURED VALUE: 36 Ohm
HIGH POWER IMPEDANCE MEASURED VALUE: 55 Ohm
Implantable Lead Implant Date: 20030708
Implantable Lead Implant Date: 20070501
Implantable Lead Implant Date: 20070503
Implantable Lead Location: 753858
Implantable Lead Location: 753860
Implantable Lead Model: 5076
Implantable Pulse Generator Implant Date: 20150915
Lead Channel Impedance Value: 304 Ohm
Lead Channel Impedance Value: 304 Ohm
Lead Channel Impedance Value: 399 Ohm
Lead Channel Impedance Value: 4047 Ohm
Lead Channel Pacing Threshold Amplitude: 0.5 V
Lead Channel Pacing Threshold Amplitude: 1.5 V
Lead Channel Pacing Threshold Pulse Width: 0.4 ms
Lead Channel Pacing Threshold Pulse Width: 0.4 ms
Lead Channel Sensing Intrinsic Amplitude: 22.125 mV
Lead Channel Sensing Intrinsic Amplitude: 22.125 mV
Lead Channel Setting Pacing Amplitude: 2.5 V
Lead Channel Setting Pacing Amplitude: 3.25 V
Lead Channel Setting Pacing Pulse Width: 0.4 ms
Lead Channel Setting Sensing Sensitivity: 0.3 mV
MDC IDC LEAD IMPLANT DT: 20111019
MDC IDC LEAD LOCATION: 753859
MDC IDC LEAD LOCATION: 753860
MDC IDC MSMT BATTERY REMAINING LONGEVITY: 31 mo
MDC IDC MSMT BATTERY VOLTAGE: 2.94 V
MDC IDC MSMT LEADCHNL LV IMPEDANCE VALUE: 4047 Ohm
MDC IDC MSMT LEADCHNL RA SENSING INTR AMPL: 2.375 mV
MDC IDC MSMT LEADCHNL RA SENSING INTR AMPL: 2.375 mV
MDC IDC MSMT LEADCHNL RV IMPEDANCE VALUE: 399 Ohm
MDC IDC MSMT LEADCHNL RV PACING THRESHOLD AMPLITUDE: 1.625 V
MDC IDC MSMT LEADCHNL RV PACING THRESHOLD PULSEWIDTH: 0.4 ms
MDC IDC SET LEADCHNL RA PACING AMPLITUDE: 2 V
MDC IDC SET LEADCHNL RV PACING PULSEWIDTH: 0.4 ms
MDC IDC STAT BRADY RA PERCENT PACED: 55.48 %
MDC IDC STAT BRADY RV PERCENT PACED: 96.25 %

## 2016-11-24 ENCOUNTER — Other Ambulatory Visit: Payer: Self-pay | Admitting: Internal Medicine

## 2016-12-12 NOTE — Telephone Encounter (Signed)
Entered in error

## 2016-12-14 ENCOUNTER — Ambulatory Visit (INDEPENDENT_AMBULATORY_CARE_PROVIDER_SITE_OTHER)
Admission: RE | Admit: 2016-12-14 | Discharge: 2016-12-14 | Disposition: A | Discharge status: Home / Self Care | Payer: Medicare Other | Source: Ambulatory Visit | Attending: Internal Medicine | Admitting: Internal Medicine

## 2016-12-14 ENCOUNTER — Encounter: Payer: Self-pay | Admitting: Internal Medicine

## 2016-12-14 ENCOUNTER — Ambulatory Visit (INDEPENDENT_AMBULATORY_CARE_PROVIDER_SITE_OTHER): Payer: Medicare Other | Admitting: Internal Medicine

## 2016-12-14 VITALS — BP 128/78 | HR 83 | Temp 98.9°F | Resp 20 | Wt 124.0 lb

## 2016-12-14 DIAGNOSIS — I10 Essential (primary) hypertension: Secondary | ICD-10-CM | POA: Diagnosis not present

## 2016-12-14 DIAGNOSIS — R059 Cough, unspecified: Secondary | ICD-10-CM | POA: Insufficient documentation

## 2016-12-14 DIAGNOSIS — R05 Cough: Secondary | ICD-10-CM | POA: Diagnosis not present

## 2016-12-14 DIAGNOSIS — E1129 Type 2 diabetes mellitus with other diabetic kidney complication: Secondary | ICD-10-CM | POA: Diagnosis not present

## 2016-12-14 MED ORDER — PREDNISONE 10 MG PO TABS: ORAL_TABLET | ORAL | 0 refills | Status: DC

## 2016-12-14 MED ORDER — HYDROCODONE-HOMATROPINE 5-1.5 MG/5ML PO SYRP: 5.0000 mL | ORAL_SOLUTION | Freq: Four times a day (QID) | ORAL | 0 refills | Status: AC | PRN

## 2016-12-14 MED ORDER — OSELTAMIVIR PHOSPHATE 75 MG PO CAPS: 75.0000 mg | ORAL_CAPSULE | Freq: Two times a day (BID) | ORAL | 0 refills | Status: DC

## 2016-12-14 MED ORDER — LEVOFLOXACIN 250 MG PO TABS: 250.0000 mg | ORAL_TABLET | Freq: Every day | ORAL | 0 refills | Status: DC

## 2016-12-14 NOTE — Patient Instructions (Signed)
Please take all new medication as prescribed - the tamiflu, antibiotic, cough medicine, and prednisone  Please continue all other medications as before, and refills have been done if requested.  Please have the pharmacy call with any other refills you may need.  Please keep your appointments with your specialists as you may have planned  Please go to the XRAY Department in the Basement (go straight as you get off the elevator) for the x-ray testing  Please remember to sign up for MyChart if you have not done so, as this will be important to you in the future with finding out test results, communicating by private email, and scheduling acute appointments online when needed.

## 2016-12-14 NOTE — Assessment & Plan Note (Signed)
stable overall by history and exam, recent data reviewed with pt, and pt to continue medical treatment as before,  to f/u any worsening symptoms or concerns BP Readings from Last 3 Encounters:  12/14/16 128/78  11/02/16 100/60  10/24/16 91/61

## 2016-12-14 NOTE — Progress Notes (Signed)
Subjective:    Patient ID: Priscilla Anderson, female    DOB: 1934-04-03, 83 y.o.   MRN: [id number]  HPI  Here with acute onset mild to mod 2-3 days fever, myalgias, ST, HA, general weakness and malaise, with prod cough clear then greenish sputum, but Pt denies chest pain, increased sob or doe, wheezing, orthopnea, PND, increased LE swelling, palpitations, dizziness or syncope. Pt denies new neurological symptoms such as new headache, or facial or extremity weakness or numbness   Pt denies polydipsia, polyuria  No  Other new history Past Medical History:  Diagnosis Date  . Adenomatous colon polyp 2004  . AICD (automatic cardioverter/defibrillator) present   . CAD (coronary artery disease)   . CHF (congestive heart failure) ([place])   . Hyperlipidemia   . Hypertension   . Nonischemic cardiomyopathy ([place]) 09/22/2011  . Osteopenia   . Polyp of larynx   . Type II diabetes mellitus ([place])    Past Surgical History:  Procedure Laterality Date  . BIV ICD GENERTAOR CHANGE OUT N/A 07/29/2014   Procedure: BIV ICD GENERTAOR CHANGE OUT;  Surgeon: Sanda Klein, MD;  Location: [facility];  Service: Cardiovascular;  Laterality: N/A;  . BREAST CYST EXCISION Left 1980  . CARDIAC CATHETERIZATION  07/28/2005   noncritical CAD  . CARDIAC CATHETERIZATION     "I've had several"  . CARDIAC DEFIBRILLATOR PLACEMENT  05/21/2002   s/p re-do pacemaker/defibrillator October 2011- [place].  . COLONOSCOPY    . IMPLANTABLE CARDIOVERTER DEFIBRILLATOR GENERATOR CHANGE  09/01/2010   Medtronic  . MICROLARYNGOSCOPY WITH CO2 LASER AND EXCISION OF VOCAL CORD LESION  08/2002   Archie Endo 03/29/2011  . VAGINAL HYSTERECTOMY      reports that she has never smoked. She has never used smokeless tobacco. She reports that she uses drugs about 2 times per week. She reports that she does not drink alcohol. family history includes Arthritis in her father and mother; Diabetes in her other; Heart disease in her brother; Prostate  cancer in her brother. Allergies  Allergen Reactions  . Lanoxin [Digoxin] Nausea Only    Nausea and wgt loss   Current Outpatient Prescriptions on File Prior to Visit  Medication Sig Dispense Refill  . Artificial Tear Solution (SOOTHE XP) SOLN Apply 1 drop to eye daily as needed (dry eyes).    . Ascorbic Acid (VITAMIN C) 500 MG tablet Take 500 mg by mouth daily.      Marland Kitchen aspirin 81 MG tablet Take 81 mg by mouth daily.      . Carboxymethylcellul-Glycerin (REFRESH OPTIVE OP) Apply 1 drop to eye at bedtime.    . carvedilol (COREG) 12.5 MG tablet TAKE 1 TABLET TWICE A DAY 180 tablet 1  . CORLANOR 5 MG TABS tablet TAKE 1 TABLET TWICE A DAY WITH MEALS 180 tablet 1  . diazepam (VALIUM) 5 MG tablet Take 1 tablet (5 mg total) by mouth at bedtime as needed for anxiety. 90 tablet 1  . furosemide (LASIX) 40 MG tablet Take 1 tablet (40 mg total) by mouth daily. 90 tablet 3  . glucose blood (ACCU-CHEK AVIVA) test strip Test two times daily. Code 250.02 100 each 11  . ibandronate (BONIVA) 150 MG tablet TAKE 1 TABLET ONCE A MONTH. 9 tablet 3  . JANUVIA 100 MG tablet TAKE 1 TABLET DAILY 90 tablet 0  . Lancets MISC Test as directed two times daily. Code 250.02 100 each 11  . losartan (COZAAR) 50 MG tablet TAKE 1 TABLET DAILY 90  tablet 2  . metFORMIN (GLUCOPHAGE-XR) 500 MG 24 hr tablet TAKE 2 TABLETS IN THE MORNING 180 tablet 3  . pantoprazole (PROTONIX) 40 MG tablet Take 1 tablet (40 mg total) by mouth 2 (two) times daily. 180 tablet 3  . saccharomyces boulardii (FLORASTOR) 250 MG capsule Take 1 capsule (250 mg total) by mouth 2 (two) times daily. 60 capsule 1  . simvastatin (ZOCOR) 20 MG tablet TAKE 1 TABLET AT BEDTIME 90 tablet 0  . spironolactone (ALDACTONE) 25 MG tablet Take 1 tablet (25 mg total) by mouth daily. 90 tablet 2  . Wheat Dextrin (BENEFIBER) POWD Take by mouth. 2 tablespoons once daily     No current facility-administered medications on file prior to visit.    Review of Systems   Constitutional: Negative for unusual diaphoresis or night sweats HENT: Negative for ear swelling or discharge Eyes: Negative for worsening visual haziness  Respiratory: Negative for choking and stridor.   Gastrointestinal: Negative for distension or worsening eructation Genitourinary: Negative for retention or change in urine volume.  Musculoskeletal: Negative for other MSK pain or swelling Skin: Negative for color change and worsening wound Neurological: Negative for tremors and numbness other than noted  Psychiatric/Behavioral: Negative for decreased concentration or agitation other than above   All other system neg per pt    Objective:   Physical Exam BP 128/78   Pulse 83   Temp 98.9 F (37.2 C) (Oral)   Resp 20   Wt 124 lb (56.2 kg)   SpO2 97%   BMI 21.28 kg/m  VS noted, mild ill, cant stop coughing Constitutional: Pt appears in no apparent distress HENT: Head: NCAT.  Right Ear: External ear normal.  Left Ear: External ear normal.  Eyes: . Pupils are equal, round, and reactive to light. Conjunctivae and EOM are normal Neck: Normal range of motion. Neck supple.  Cardiovascular: Normal rate and regular rhythm.   Pulmonary/Chest: Effort normal and breath sounds decresaed right base, without rales but with few scattered rhonchi and wheezing.  Neurological: Pt is alert. Not confused , motor grossly intact Skin: Skin is warm. No rash, no LE edema Psychiatric: Pt behavior is normal. No agitation.  No other new exam findings    Assessment & Plan:

## 2016-12-14 NOTE — Assessment & Plan Note (Signed)
With influenza like illness, and abnormal BS cant r/o pna, for tamiflu, levaquin, cough med prn, predpac asd and cxr

## 2016-12-14 NOTE — Progress Notes (Signed)
Pre visit review using our clinic review tool, if applicable. No additional management support is needed unless otherwise documented below in the visit note. 

## 2016-12-14 NOTE — Assessment & Plan Note (Signed)
stable overall by history and exam, recent data reviewed with pt, and pt to continue medical treatment as before,  to f/u any worsening symptoms or concerns Lab Results  Component Value Date   HGBA1C 6.7 (H) 08/10/2016

## 2016-12-21 ENCOUNTER — Ambulatory Visit (INDEPENDENT_AMBULATORY_CARE_PROVIDER_SITE_OTHER): Payer: Medicare Other | Admitting: Gastroenterology

## 2016-12-21 ENCOUNTER — Encounter: Payer: Self-pay | Admitting: Gastroenterology

## 2016-12-21 ENCOUNTER — Ambulatory Visit: Payer: Medicare Other | Admitting: Gastroenterology

## 2016-12-21 VITALS — BP 100/60 | HR 76 | Ht 64.0 in | Wt 120.0 lb

## 2016-12-21 DIAGNOSIS — R1013 Epigastric pain: Secondary | ICD-10-CM

## 2016-12-21 DIAGNOSIS — Z86010 Personal history of colonic polyps: Secondary | ICD-10-CM

## 2016-12-21 NOTE — Progress Notes (Signed)
History of Present Illness: This is an 83 year old female referred by Biagio Borg, MD for the evaluation of epigastric pain. Her symptoms started a couple months ago. She was evaluated by Dr. Jenny Reichmann and her Protonix was increased to twice daily. She was advised to use Tums as needed which were very helpful for breakthrough symptoms. After a couple weeks her epigastric pain resolved. She is now symptom free. She has CHF with an EF=20%. Denies weight loss, constipation, diarrhea, change in stool caliber, melena, hematochezia, nausea, vomiting, dysphagia, chest pain.   Allergies  Allergen Reactions  . Lanoxin [Digoxin] Nausea Only    Nausea and wgt loss   Outpatient Medications Prior to Visit  Medication Sig Dispense Refill  . Artificial Tear Solution (SOOTHE XP) SOLN Apply 1 drop to eye daily as needed (dry eyes).    . Ascorbic Acid (VITAMIN C) 500 MG tablet Take 500 mg by mouth daily.      Marland Kitchen aspirin 81 MG tablet Take 81 mg by mouth daily.      . Carboxymethylcellul-Glycerin (REFRESH OPTIVE OP) Apply 1 drop to eye at bedtime.    . carvedilol (COREG) 12.5 MG tablet TAKE 1 TABLET TWICE A DAY 180 tablet 1  . CORLANOR 5 MG TABS tablet TAKE 1 TABLET TWICE A DAY WITH MEALS 180 tablet 1  . diazepam (VALIUM) 5 MG tablet Take 1 tablet (5 mg total) by mouth at bedtime as needed for anxiety. 90 tablet 1  . furosemide (LASIX) 40 MG tablet Take 1 tablet (40 mg total) by mouth daily. 90 tablet 3  . glucose blood (ACCU-CHEK AVIVA) test strip Test two times daily. Code 250.02 100 each 11  . HYDROcodone-homatropine (HYCODAN) 5-1.5 MG/5ML syrup Take 5 mLs by mouth every 6 (six) hours as needed for cough. 180 mL 0  . ibandronate (BONIVA) 150 MG tablet TAKE 1 TABLET ONCE A MONTH. 9 tablet 3  . JANUVIA 100 MG tablet TAKE 1 TABLET DAILY 90 tablet 0  . Lancets MISC Test as directed two times daily. Code 250.02 100 each 11  . levofloxacin (LEVAQUIN) 250 MG tablet Take 1 tablet (250 mg total) by mouth daily. 10  tablet 0  . losartan (COZAAR) 50 MG tablet TAKE 1 TABLET DAILY 90 tablet 2  . pantoprazole (PROTONIX) 40 MG tablet Take 1 tablet (40 mg total) by mouth 2 (two) times daily. 180 tablet 3  . saccharomyces boulardii (FLORASTOR) 250 MG capsule Take 1 capsule (250 mg total) by mouth 2 (two) times daily. 60 capsule 1  . simvastatin (ZOCOR) 20 MG tablet TAKE 1 TABLET AT BEDTIME 90 tablet 0  . spironolactone (ALDACTONE) 25 MG tablet Take 1 tablet (25 mg total) by mouth daily. 90 tablet 2  . Wheat Dextrin (BENEFIBER) POWD Take by mouth. 2 tablespoons once daily    . metFORMIN (GLUCOPHAGE-XR) 500 MG 24 hr tablet TAKE 2 TABLETS IN THE MORNING (Patient not taking: Reported on 12/21/2016) 180 tablet 3  . oseltamivir (TAMIFLU) 75 MG capsule Take 1 capsule (75 mg total) by mouth 2 (two) times daily. (Patient not taking: Reported on 12/21/2016) 10 capsule 0  . predniSONE (DELTASONE) 10 MG tablet 2 tabs by mouth per day for 5 days (Patient not taking: Reported on 12/21/2016) 10 tablet 0   No facility-administered medications prior to visit.    Past Medical History:  Diagnosis Date  . Adenomatous colon polyp 2004  . AICD (automatic cardioverter/defibrillator) present   . CAD (coronary artery disease)   .  CHF (congestive heart failure) ([place])   . Hyperlipidemia   . Hypertension   . Nonischemic cardiomyopathy ([place]) 09/22/2011  . Osteopenia   . Polyp of larynx   . Type II diabetes mellitus ([place])    Past Surgical History:  Procedure Laterality Date  . BIV ICD GENERTAOR CHANGE OUT N/A 07/29/2014   Procedure: BIV ICD GENERTAOR CHANGE OUT;  Surgeon: Sanda Klein, MD;  Location: [facility];  Service: Cardiovascular;  Laterality: N/A;  . BREAST CYST EXCISION Left 1980  . CARDIAC CATHETERIZATION  07/28/2005   noncritical CAD  . CARDIAC CATHETERIZATION     "I've had several"  . CARDIAC DEFIBRILLATOR PLACEMENT  05/21/2002   s/p re-do pacemaker/defibrillator October 2011- [place].  . COLONOSCOPY    .  IMPLANTABLE CARDIOVERTER DEFIBRILLATOR GENERATOR CHANGE  09/01/2010   Medtronic  . MICROLARYNGOSCOPY WITH CO2 LASER AND EXCISION OF VOCAL CORD LESION  08/2002   Archie Endo 03/29/2011  . VAGINAL HYSTERECTOMY     Social History   Social History  . Marital status: Widowed    Spouse name: N/A  . Number of children: 2  . Years of education: N/A   Occupational History  . retired Investment banker, corporate. report clerk Retired   Social History Main Topics  . Smoking status: Never Smoker  . Smokeless tobacco: Never Used  . Alcohol use No  . Drug use: Yes    Frequency: 2.0 times per week  . Sexual activity: Not Asked   Other Topics Concern  . None   Social History Narrative   Husband now with Multiple Myeloma- see Dr. Juanita Craver   Family History  Problem Relation Age of Onset  . Arthritis Mother   . Arthritis Father   . Heart disease Brother     CABG  . Prostate cancer Brother     Prostate problem with up and down PSA  . Diabetes Other     1st degree      Review of Systems: Pertinent positive and negative review of systems were noted in the above HPI section. All other review of systems were otherwise negative.   Physical Exam: General: Well developed, well nourished, thin, no acute distress Head: Normocephalic and atraumatic Eyes:  sclerae anicteric, EOMI Ears: Normal auditory acuity Mouth: No deformity or lesions Neck: Supple, no masses or thyromegaly Lungs: Clear throughout to auscultation Heart: Regular rate and rhythm; no murmurs, rubs or bruits Abdomen: Soft, non tender and non distended. No masses, hepatosplenomegaly or hernias noted. Normal Bowel sounds Musculoskeletal: Symmetrical with no gross deformities  Skin: No lesions on visible extremities Pulses:  Normal pulses noted Extremities: No clubbing, cyanosis, edema or deformities noted Neurological: Alert oriented x 4, grossly nonfocal Cervical Nodes:  No significant cervical adenopathy Inguinal Nodes: No  significant inguinal adenopathy Psychological:  Alert and cooperative. Normal mood and affect  Assessment and Recommendations:  1. Epigastric pain-suspected GERD, gastritis. Her symptoms have resolved on pantoprazole 40 mg twice daily and Tums when necessary - continue current regimen. We discussed the possibility of EGD for further evaluation however with her cardiac problems, including ICD and an EF=20%, she is at higher risk for endoscopic procedures and sedation and she is very motivated to avoid procedures. If her symptoms return and cannot be managed with current regimen will reconsider EGD. Ongoing follow-up with Dr. Jenny Reichmann. GI follow up prn.  2. Personal history of adenomatous colon polyps. Patient is no longer in a colonoscopy surveillance program due to her age and comorbidities.  cc: Jeneen Rinks  Quin Hoop, MD [address] [place],  [address]

## 2016-12-21 NOTE — Patient Instructions (Signed)
Stay on current medications.

## 2016-12-24 ENCOUNTER — Other Ambulatory Visit: Payer: Self-pay | Admitting: Cardiovascular Disease

## 2017-01-07 ENCOUNTER — Other Ambulatory Visit: Payer: Self-pay | Admitting: Cardiology

## 2017-01-10 NOTE — Telephone Encounter (Signed)
Rx(s) sent to pharmacy electronically.  

## 2017-01-23 ENCOUNTER — Other Ambulatory Visit: Payer: Self-pay | Admitting: Internal Medicine

## 2017-01-23 ENCOUNTER — Ambulatory Visit (INDEPENDENT_AMBULATORY_CARE_PROVIDER_SITE_OTHER): Payer: Medicare Other | Admitting: *Deleted

## 2017-01-23 DIAGNOSIS — I420 Dilated cardiomyopathy: Secondary | ICD-10-CM

## 2017-01-23 DIAGNOSIS — I429 Cardiomyopathy, unspecified: Secondary | ICD-10-CM

## 2017-01-24 NOTE — Progress Notes (Signed)
Remote ICD transmission.   

## 2017-01-25 ENCOUNTER — Encounter: Payer: Self-pay | Admitting: Cardiology

## 2017-01-27 ENCOUNTER — Telehealth: Payer: Self-pay | Admitting: *Deleted

## 2017-01-27 ENCOUNTER — Other Ambulatory Visit: Payer: Self-pay | Admitting: Cardiovascular Disease

## 2017-01-27 LAB — CUP PACEART REMOTE DEVICE CHECK
Battery Remaining Longevity: 26 mo
Brady Statistic AP VP Percent: 56.97 %
Brady Statistic AP VS Percent: 0.18 %
Brady Statistic AS VP Percent: 39.73 %
Brady Statistic AS VS Percent: 3.12 %
Brady Statistic RV Percent Paced: 90.54 %
HIGH POWER IMPEDANCE MEASURED VALUE: 35 Ohm
HighPow Impedance: 51 Ohm
Implantable Lead Implant Date: 20030708
Implantable Lead Implant Date: 20070503
Implantable Lead Implant Date: 20111019
Implantable Lead Location: 753858
Implantable Lead Location: 753859
Implantable Lead Location: 753860
Implantable Lead Model: 5076
Lead Channel Impedance Value: 304 Ohm
Lead Channel Impedance Value: 304 Ohm
Lead Channel Impedance Value: 399 Ohm
Lead Channel Impedance Value: 4047 Ohm
Lead Channel Pacing Threshold Amplitude: 1.25 V
Lead Channel Pacing Threshold Amplitude: 1.5 V
Lead Channel Pacing Threshold Pulse Width: 0.4 ms
Lead Channel Sensing Intrinsic Amplitude: 2.75 mV
Lead Channel Sensing Intrinsic Amplitude: 2.75 mV
Lead Channel Setting Pacing Amplitude: 2.25 V
Lead Channel Setting Pacing Amplitude: 3.25 V
Lead Channel Setting Pacing Pulse Width: 0.4 ms
MDC IDC LEAD IMPLANT DT: 20070501
MDC IDC LEAD LOCATION: 753860
MDC IDC MSMT BATTERY VOLTAGE: 2.94 V
MDC IDC MSMT LEADCHNL LV IMPEDANCE VALUE: 4047 Ohm
MDC IDC MSMT LEADCHNL LV PACING THRESHOLD PULSEWIDTH: 0.4 ms
MDC IDC MSMT LEADCHNL RA IMPEDANCE VALUE: 361 Ohm
MDC IDC MSMT LEADCHNL RA PACING THRESHOLD AMPLITUDE: 0.5 V
MDC IDC MSMT LEADCHNL RV PACING THRESHOLD PULSEWIDTH: 0.4 ms
MDC IDC MSMT LEADCHNL RV SENSING INTR AMPL: 15.75 mV
MDC IDC MSMT LEADCHNL RV SENSING INTR AMPL: 15.75 mV
MDC IDC PG IMPLANT DT: 20150915
MDC IDC SESS DTM: 20180312162912
MDC IDC SET LEADCHNL LV PACING PULSEWIDTH: 0.4 ms
MDC IDC SET LEADCHNL RA PACING AMPLITUDE: 2 V
MDC IDC SET LEADCHNL RV SENSING SENSITIVITY: 0.3 mV
MDC IDC STAT BRADY RA PERCENT PACED: 52.36 %

## 2017-01-27 NOTE — Telephone Encounter (Signed)
Abnormal optivol.  [email or web address]

## 2017-01-30 NOTE — Telephone Encounter (Signed)
Patient returned my phone call from Friday. Patient denies any ShOB, DOE, LE edema, or orthopnea. Patient states that she will continue to limit her salt intake.  Patient to f/u as scheduled.

## 2017-02-07 ENCOUNTER — Other Ambulatory Visit (INDEPENDENT_AMBULATORY_CARE_PROVIDER_SITE_OTHER): Payer: Medicare Other

## 2017-02-07 ENCOUNTER — Encounter: Payer: Self-pay | Admitting: Internal Medicine

## 2017-02-07 ENCOUNTER — Ambulatory Visit (INDEPENDENT_AMBULATORY_CARE_PROVIDER_SITE_OTHER): Payer: Medicare Other | Admitting: Internal Medicine

## 2017-02-07 VITALS — BP 110/70 | HR 70 | Ht 64.5 in | Wt 116.0 lb

## 2017-02-07 DIAGNOSIS — N183 Chronic kidney disease, stage 3 unspecified: Secondary | ICD-10-CM

## 2017-02-07 DIAGNOSIS — I10 Essential (primary) hypertension: Secondary | ICD-10-CM

## 2017-02-07 DIAGNOSIS — I5022 Chronic systolic (congestive) heart failure: Secondary | ICD-10-CM | POA: Diagnosis not present

## 2017-02-07 DIAGNOSIS — E1129 Type 2 diabetes mellitus with other diabetic kidney complication: Secondary | ICD-10-CM | POA: Diagnosis not present

## 2017-02-07 LAB — BASIC METABOLIC PANEL
BUN: 33 mg/dL — ABNORMAL HIGH (ref 6–23)
CALCIUM: 9.8 mg/dL (ref 8.4–10.5)
CO2: 28 meq/L (ref 19–32)
CREATININE: 2.05 mg/dL — AB (ref 0.40–1.20)
Chloride: 101 mEq/L (ref 96–112)
GFR: 29.89 mL/min — AB (ref >60.00)
GLUCOSE: 159 mg/dL — AB (ref 70–99)
Potassium: 4.1 mEq/L (ref 3.5–5.1)
SODIUM: 137 meq/L (ref 135–145)

## 2017-02-07 LAB — HEPATIC FUNCTION PANEL
ALBUMIN: 4.4 g/dL (ref 3.5–5.2)
ALK PHOS: 40 U/L (ref 39–117)
ALT: 8 U/L (ref 0–35)
AST: 14 U/L (ref 0–37)
Bilirubin, Direct: 0.1 mg/dL (ref 0.0–0.3)
TOTAL PROTEIN: 7 g/dL (ref 6.0–8.3)
Total Bilirubin: 0.4 mg/dL (ref 0.2–1.2)

## 2017-02-07 LAB — HEMOGLOBIN A1C: Hgb A1c MFr Bld: 6.8 % — ABNORMAL HIGH (ref 4.6–6.5)

## 2017-02-07 LAB — LIPID PANEL
Cholesterol: 128 mg/dL (ref 0–200)
HDL: 50.7 mg/dL (ref >39.00)
LDL CALC: 64 mg/dL (ref 0–99)
NONHDL: 77.24
Total CHOL/HDL Ratio: 3
Triglycerides: 67 mg/dL (ref 0.0–149.0)
VLDL: 13.4 mg/dL (ref 0.0–40.0)

## 2017-02-07 MED ORDER — FUROSEMIDE 40 MG PO TABS: 60.0000 mg | ORAL_TABLET | Freq: Every day | ORAL | 3 refills | Status: DC

## 2017-02-07 MED ORDER — DIAZEPAM 5 MG PO TABS: 5.0000 mg | ORAL_TABLET | Freq: Every evening | ORAL | 1 refills | Status: DC | PRN

## 2017-02-07 NOTE — Patient Instructions (Signed)
OK to increase the lasix to 60 mg per day  Please continue all other medications as before, and refills have been done if requested.  Please have the pharmacy call with any other refills you may need.  Please continue your efforts at being more active, low cholesterol diet, and weight control.  Please keep your appointments with your specialists as you may have planned  Please go to the LAB in the Basement (turn left off the elevator) for the tests to be done today  You will be contacted by phone if any changes need to be made immediately.  Otherwise, you will receive a letter about your results with an explanation, but please check with MyChart first.  Please remember to sign up for MyChart if you have not done so, as this will be important to you in the future with finding out test results, communicating by private email, and scheduling acute appointments online when needed.  Please return in 3 months, or sooner if needed

## 2017-02-07 NOTE — Progress Notes (Signed)
Subjective:    Patient ID: Priscilla Anderson, female    DOB: [birth year], 83 y.o.   MRN: [id number]  HPI  Here to f/u; overall doing ok,  Pt denies chest pain,  wheezing, orthopnea, PND, increased LE swelling, palpitations, dizziness or syncope, though has been somewhat sob recently..  Pt denies new neurological symptoms such as new headache, or facial or extremity weakness or numbness.  Pt denies polydipsia, polyuria, or low sugar episode.   Pt denies new neurological symptoms such as new headache, or facial or extremity weakness or numbness.   Pt states overall good compliance with meds, and did increase her lasix on her own to 60 bid after informed this may be needed after informed per cardiology of test results.  Denies worsening reflux, abd pain, dysphagia, n/v, bowel change or blood. Wt Readings from Last 3 Encounters:  02/07/17 116 lb (52.6 kg)  12/21/16 120 lb (54.4 kg)  12/14/16 124 lb (56.2 kg)   Lab Results  Component Value Date   CREATININE 1.96 (H) 10/31/2016   Past Medical History:  Diagnosis Date  . Adenomatous colon polyp 2004  . AICD (automatic cardioverter/defibrillator) present   . CAD (coronary artery disease)   . CHF (congestive heart failure) ([place])   . Hyperlipidemia   . Hypertension   . Nonischemic cardiomyopathy ([place]) 09/22/2011  . Osteopenia   . Polyp of larynx   . Type II diabetes mellitus ([place])    Past Surgical History:  Procedure Laterality Date  . BIV ICD GENERTAOR CHANGE OUT N/A 07/29/2014   Procedure: BIV ICD GENERTAOR CHANGE OUT;  Surgeon: Sanda Klein, MD;  Location: [facility];  Service: Cardiovascular;  Laterality: N/A;  . BREAST CYST EXCISION Left 1980  . CARDIAC CATHETERIZATION  07/28/2005   noncritical CAD  . CARDIAC CATHETERIZATION     "I've had several"  . CARDIAC DEFIBRILLATOR PLACEMENT  05/21/2002   s/p re-do pacemaker/defibrillator October 2011- [place].  . COLONOSCOPY    . IMPLANTABLE CARDIOVERTER DEFIBRILLATOR GENERATOR CHANGE   09/01/2010   [employer]tronic  . MICROLARYNGOSCOPY WITH CO2 LASER AND EXCISION OF VOCAL CORD LESION  08/2002   Archie Endo 03/29/2011  . VAGINAL HYSTERECTOMY      reports that she has never smoked. She has never used smokeless tobacco. She reports that she uses drugs about 2 times per week. She reports that she does not drink alcohol. family history includes Arthritis in her father and mother; Diabetes in her other; Heart disease in her brother; Prostate cancer in her brother. Allergies  Allergen Reactions  . Lanoxin [Digoxin] Nausea Only    Nausea and wgt loss   Current Outpatient Prescriptions on File Prior to Visit  Medication Sig Dispense Refill  . Artificial Tear Solution (SOOTHE XP) SOLN Apply 1 drop to eye daily as needed (dry eyes).    . Ascorbic Acid (VITAMIN C) 500 MG tablet Take 500 mg by mouth daily.      Marland Kitchen aspirin 81 MG tablet Take 81 mg by mouth daily.      . Carboxymethylcellul-Glycerin (REFRESH OPTIVE OP) Apply 1 drop to eye at bedtime.    . carvedilol (COREG) 12.5 MG tablet TAKE 1 TABLET TWICE A DAY 180 tablet 1  . CORLANOR 5 MG TABS tablet TAKE 1 TABLET TWICE A DAY WITH MEALS 180 tablet 1  . glucose blood (ACCU-CHEK AVIVA) test strip Test two times daily. Code 250.02 100 each 11  . ibandronate (BONIVA) 150 MG tablet TAKE 1 TABLET ONCE A MONTH.  9 tablet 3  . JANUVIA 100 MG tablet TAKE 1 TABLET DAILY 90 tablet 1  . Lancets MISC Test as directed two times daily. Code 250.02 100 each 11  . losartan (COZAAR) 50 MG tablet TAKE 1 TABLET DAILY 90 tablet 2  . metFORMIN (GLUCOPHAGE) 500 MG tablet Take 500 mg by mouth daily with breakfast.    . pantoprazole (PROTONIX) 40 MG tablet Take 1 tablet (40 mg total) by mouth 2 (two) times daily. 180 tablet 3  . saccharomyces boulardii (FLORASTOR) 250 MG capsule Take 1 capsule (250 mg total) by mouth 2 (two) times daily. 60 capsule 1  . simvastatin (ZOCOR) 20 MG tablet TAKE 1 TABLET AT BEDTIME 90 tablet 1  . spironolactone (ALDACTONE) 25 MG tablet  Take 1 tablet (25 mg total) by mouth daily. 90 tablet 2  . Wheat Dextrin (BENEFIBER) POWD Take by mouth. 2 tablespoons once daily     No current facility-administered medications on file prior to visit.    Review of Systems  Constitutional: Negative for other unusual diaphoresis or sweats HENT: Negative for ear discharge or swelling Eyes: Negative for other worsening visual disturbances Respiratory: Negative for stridor or other swelling  Gastrointestinal: Negative for worsening distension or other blood Genitourinary: Negative for retention or other urinary change Musculoskeletal: Negative for other MSK pain or swelling Skin: Negative for color change or other new lesions Neurological: Negative for worsening tremors and other numbness  Psychiatric/Behavioral: Negative for worsening agitation or other fatigue All other system neg per pt    Objective:   Physical Exam BP 110/70   Pulse 70   Ht 5' 4.5" (1.638 m)   Wt 116 lb (52.6 kg)   SpO2 97%   BMI 19.60 kg/m  VS noted,  Constitutional: Pt appears in NAD HENT: Head: NCAT.  Right Ear: External ear normal.  Left Ear: External ear normal.  Eyes: . Pupils are equal, round, and reactive to light. Conjunctivae and EOM are normal Nose: without d/c or deformity Neck: Neck supple. Gross normal ROM Cardiovascular: Normal rate and regular rhythm.   Pulmonary/Chest: Effort normal and breath sounds without rales or wheezing.  Abd:  Soft, NT, ND, + BS, no organomegaly Neurological: Pt is alert. At baseline orientation, motor grossly intact Skin: Skin is warm. No rashes, other new lesions, no LE edema Psychiatric: Pt behavior is normal without agitation  No other exam findings    Assessment & Plan:

## 2017-02-07 NOTE — Progress Notes (Signed)
Pre visit review using our clinic review tool, if applicable. No additional management support is needed unless otherwise documented below in the visit note. 

## 2017-02-11 NOTE — Assessment & Plan Note (Signed)
stable overall by history and exam, recent data reviewed with pt, and pt to continue medical treatment as before,  to f/u any worsening symptoms or concerns  

## 2017-02-11 NOTE — Assessment & Plan Note (Signed)
stable overall by history and exam, recent data reviewed with pt, and pt to continue medical treatment as before,  to f/u any worsening symptoms or concerns BP Readings from Last 3 Encounters:  02/07/17 110/70  12/21/16 100/60  12/14/16 128/78

## 2017-02-11 NOTE — Assessment & Plan Note (Signed)
stable overall by history and exam, recent data reviewed with pt, and pt to continue medical treatment as before,  to f/u any worsening symptoms or concerns Lab Results  Component Value Date   HGBA1C 6.8 (H) 02/07/2017

## 2017-02-11 NOTE — Assessment & Plan Note (Signed)
euvolemic by exam today, to cont lasix 60 bid, f/u lab next visit

## 2017-02-17 ENCOUNTER — Telehealth: Payer: Self-pay

## 2017-02-17 NOTE — Telephone Encounter (Signed)
Referred to ICM clinic by [facility], Device RN and Dr Sallyanne Kuster.   Call to patient provided ICM intro and she agreed to monthly follow up.  She adjusted her Lasix without discussing with a physician about a month ago.  She told Dr Jenny Reichmann at her appointment last week she had increased the Lasix to 60 mg and Dr Jenny Reichmann consulted with Dr Sallyanne Kuster regarding the dosage.  Dr Jenny Reichmann instructed her to continue with Lasix 60 mg daily.   ICM remote transmission scheduled for 03/07/2017 and provided ICM direct number.  Encouraged her to call if she has any fluid symptoms.

## 2017-02-24 ENCOUNTER — Telehealth: Payer: Self-pay | Admitting: Internal Medicine

## 2017-02-24 NOTE — Telephone Encounter (Signed)
Pt is still having same symptoms from January and would like a refill of HYDROcodone-homatropine (HYCODAN) 5-1.5 MG/5ML syrup

## 2017-02-24 NOTE — Telephone Encounter (Signed)
Pt needs an appt to be seen and reevaluated.

## 2017-02-28 ENCOUNTER — Ambulatory Visit (INDEPENDENT_AMBULATORY_CARE_PROVIDER_SITE_OTHER)
Admission: RE | Admit: 2017-02-28 | Discharge: 2017-02-28 | Disposition: A | Discharge status: Home / Self Care | Payer: Medicare Other | Source: Ambulatory Visit | Attending: Internal Medicine | Admitting: Internal Medicine

## 2017-02-28 ENCOUNTER — Encounter: Payer: Self-pay | Admitting: Internal Medicine

## 2017-02-28 ENCOUNTER — Ambulatory Visit (INDEPENDENT_AMBULATORY_CARE_PROVIDER_SITE_OTHER): Payer: Medicare Other | Admitting: Internal Medicine

## 2017-02-28 VITALS — BP 108/70 | HR 73 | Wt 114.0 lb

## 2017-02-28 DIAGNOSIS — J309 Allergic rhinitis, unspecified: Secondary | ICD-10-CM

## 2017-02-28 DIAGNOSIS — R05 Cough: Secondary | ICD-10-CM

## 2017-02-28 DIAGNOSIS — I5022 Chronic systolic (congestive) heart failure: Secondary | ICD-10-CM

## 2017-02-28 DIAGNOSIS — R059 Cough, unspecified: Secondary | ICD-10-CM

## 2017-02-28 MED ORDER — PREDNISONE 10 MG PO TABS: ORAL_TABLET | ORAL | 0 refills | Status: DC

## 2017-02-28 MED ORDER — METHYLPREDNISOLONE ACETATE 80 MG/ML IJ SUSP
80.0000 mg | Freq: Once | INTRAMUSCULAR | Status: AC
  Administered 2017-02-28: 80 mg via INTRAMUSCULAR

## 2017-02-28 MED ORDER — HYDROCODONE-HOMATROPINE 5-1.5 MG/5ML PO SYRP: 5.0000 mL | ORAL_SOLUTION | Freq: Four times a day (QID) | ORAL | 0 refills | Status: AC | PRN

## 2017-02-28 NOTE — Progress Notes (Signed)
Subjective:    Patient ID: Priscilla Anderson, female    DOB: 1934-02-26, 83 y.o.   MRN: [id number]  HPI Here to f/u with c/o 1 wk onset cough, somewhat similar to jan 2018 CHF exacerbation but this time has no wheezing; denied fever or pain, but has nasal congestion and muffled right hearing, slightly scratchy throat, but does not feel otherwise particularly ill.  Took claritin but no help.  Has had good response to the lasix at higher dosing, Pt denies chest pain, increased sob or doe, wheezing, orthopnea, PND, increased LE swelling, palpitations, dizziness or syncope. And wt down another 2 lbs, she thinks is likely fluid.  Pt denies new neurological symptoms such as new headache, or facial or extremity weakness or numbness   Pt denies polydipsia, polyuria Past Medical History:  Diagnosis Date  . Adenomatous colon polyp 2004  . AICD (automatic cardioverter/defibrillator) present   . CAD (coronary artery disease)   . CHF (congestive heart failure) ([place])   . Hyperlipidemia   . Hypertension   . Nonischemic cardiomyopathy ([place]) 09/22/2011  . Osteopenia   . Polyp of larynx   . Type II diabetes mellitus ([place])    Past Surgical History:  Procedure Laterality Date  . BIV ICD GENERTAOR CHANGE OUT N/A 07/29/2014   Procedure: BIV ICD GENERTAOR CHANGE OUT;  Surgeon: Sanda Klein, MD;  Location: [facility] CATH LAB;  Service: Cardiovascular;  Laterality: N/A;  . BREAST CYST EXCISION Left 1980  . CARDIAC CATHETERIZATION  07/28/2005   noncritical CAD  . CARDIAC CATHETERIZATION     "I've had several"  . CARDIAC DEFIBRILLATOR PLACEMENT  05/21/2002   s/p re-do pacemaker/defibrillator October 2011- [place].  . COLONOSCOPY    . IMPLANTABLE CARDIOVERTER DEFIBRILLATOR GENERATOR CHANGE  09/01/2010   [employer]  . MICROLARYNGOSCOPY WITH CO2 LASER AND EXCISION OF VOCAL CORD LESION  08/2002   Archie Endo 03/29/2011  . VAGINAL HYSTERECTOMY      reports that she has never smoked. She has never used smokeless  tobacco. She reports that she uses drugs about 2 times per week. She reports that she does not drink alcohol. family history includes Arthritis in her father and mother; Diabetes in her other; Heart disease in her brother; Prostate cancer in her brother. Allergies  Allergen Reactions  . Lanoxin [Digoxin] Nausea Only    Nausea and wgt loss   Current Outpatient Prescriptions on File Prior to Visit  Medication Sig Dispense Refill  . Artificial Tear Solution (SOOTHE XP) SOLN Apply 1 drop to eye daily as needed (dry eyes).    . Ascorbic Acid (VITAMIN C) 500 MG tablet Take 500 mg by mouth daily.      Marland Kitchen aspirin 81 MG tablet Take 81 mg by mouth daily.      . Carboxymethylcellul-Glycerin (REFRESH OPTIVE OP) Apply 1 drop to eye at bedtime.    . carvedilol (COREG) 12.5 MG tablet TAKE 1 TABLET TWICE A DAY 180 tablet 1  . CORLANOR 5 MG TABS tablet TAKE 1 TABLET TWICE A DAY WITH MEALS 180 tablet 1  . diazepam (VALIUM) 5 MG tablet Take 1 tablet (5 mg total) by mouth at bedtime as needed for anxiety. 90 tablet 1  . furosemide (LASIX) 40 MG tablet Take 1.5 tablets (60 mg total) by mouth daily. 135 tablet 3  . glucose blood (ACCU-CHEK AVIVA) test strip Test two times daily. Code 250.02 100 each 11  . ibandronate (BONIVA) 150 MG tablet TAKE 1 TABLET ONCE A MONTH. 9 tablet  3  . JANUVIA 100 MG tablet TAKE 1 TABLET DAILY 90 tablet 1  . Lancets MISC Test as directed two times daily. Code 250.02 100 each 11  . losartan (COZAAR) 50 MG tablet TAKE 1 TABLET DAILY 90 tablet 2  . metFORMIN (GLUCOPHAGE) 500 MG tablet Take 500 mg by mouth daily with breakfast.    . pantoprazole (PROTONIX) 40 MG tablet Take 1 tablet (40 mg total) by mouth 2 (two) times daily. (Patient taking differently: Take 40 mg by mouth daily. ) 180 tablet 3  . saccharomyces boulardii (FLORASTOR) 250 MG capsule Take 1 capsule (250 mg total) by mouth 2 (two) times daily. 60 capsule 1  . simvastatin (ZOCOR) 20 MG tablet TAKE 1 TABLET AT BEDTIME 90 tablet  1  . spironolactone (ALDACTONE) 25 MG tablet Take 1 tablet (25 mg total) by mouth daily. 90 tablet 2  . Wheat Dextrin (BENEFIBER) POWD Take by mouth. 2 tablespoons once daily     No current facility-administered medications on file prior to visit.    Review of Systems  Constitutional: Negative for other unusual diaphoresis or sweats HENT: Negative for ear discharge or swelling Eyes: Negative for other worsening visual disturbances Respiratory: Negative for stridor or other swelling  Gastrointestinal: Negative for worsening distension or other blood Genitourinary: Negative for retention or other urinary change Musculoskeletal: Negative for other MSK pain or swelling Skin: Negative for color change or other new lesions Neurological: Negative for worsening tremors and other numbness  Psychiatric/Behavioral: Negative for worsening agitation or other fatigue All other system neg per pt    Objective:   Physical Exam  BP 108/70   Pulse 73   Wt 114 lb (51.7 kg)   SpO2 98%   BMI 19.27 kg/m  VS noted, not ill apepearing Constitutional: Pt appears in NAD HENT: Head: NCAT.  Right Ear: External ear normal.  Left Ear: External ear normal.  Eyes: . Pupils are equal, round, and reactive to light. Conjunctivae and EOM are normal Bilat tm's with mild erythema.  Max sinus areas non tender.  Pharynx with mild erythema, no exudate Nose: without d/c or deformity Neck: Neck supple. Gross normal ROM Cardiovascular: Normal rate and regular rhythm.   Pulmonary/Chest: Effort normal and breath sounds decreased bilat without rales or wheezing.  Neurological: Pt is alert. At baseline orientation, motor grossly intact Skin: Skin is warm. No rashes, other new lesions, no LE edema Psychiatric: Pt behavior is normal without agitation  No other exam findings   CLINICAL DATA:  Four days of cough, wheezing, and dyspnea. History of coronary artery disease, CHF, hypertension, and  diabetes, nonsmoker.  CHEST  2 VIEW 1-31/2018 - summary  FINDINGS: The lungs are hyperinflated with hemidiaphragm flattening. There is no focal infiltrate. The cardiac silhouette is enlarged. The pulmonary vascularity is mildly prominent. The interstitial markings of both lungs are coarse but this is not greatly changed. The ICD is in stable position. There is calcification in the wall of the aortic arch. The mediastinum is normal in width. The bony thorax exhibits no acute abnormality.  IMPRESSION: COPD. Cardiomegaly with mild pulmonary vascular congestion consistent with low-grade compensated CHF. There is no alveolar pneumonia nor pleural effusion.  Thoracic aortic atherosclerosis.        Assessment & Plan:

## 2017-02-28 NOTE — Assessment & Plan Note (Signed)
Likely related to allergic post nasal gtt, but cant r/o other such as infection or chf - for cxr

## 2017-02-28 NOTE — Assessment & Plan Note (Signed)
With mild to mod seasonal flare, for depomedrol 80 IM, predpac asd, cont claritin prn

## 2017-02-28 NOTE — Progress Notes (Signed)
Pre visit review using our clinic review tool, if applicable. No additional management support is needed unless otherwise documented below in the visit note. 

## 2017-02-28 NOTE — Patient Instructions (Signed)
You had the steroid shot today  Please take all new medication as prescribed - the prednisone  Please continue all other medications as before, and refills have been done if requested - the cough medicine  Please have the pharmacy call with any other refills you may need.  Please keep your appointments with your specialists as you may have planned  Please go to the XRAY Department in the Basement (go straight as you get off the elevator) for the x-ray testing  You will be contacted by phone if any changes need to be made immediately.  Otherwise, you will receive a letter about your results with an explanation, but please check with MyChart first.  Please remember to sign up for MyChart if you have not done so, as this will be important to you in the future with finding out test results, communicating by private email, and scheduling acute appointments online when needed.

## 2017-02-28 NOTE — Assessment & Plan Note (Signed)
Otherwise improved, stable overall by history and exam, and pt to continue medical treatment as before,  to f/u any worsening symptoms or concerns

## 2017-03-07 ENCOUNTER — Ambulatory Visit (INDEPENDENT_AMBULATORY_CARE_PROVIDER_SITE_OTHER): Payer: Medicare Other

## 2017-03-07 DIAGNOSIS — I5022 Chronic systolic (congestive) heart failure: Secondary | ICD-10-CM

## 2017-03-07 DIAGNOSIS — Z95810 Presence of automatic (implantable) cardiac defibrillator: Secondary | ICD-10-CM

## 2017-03-07 NOTE — Progress Notes (Signed)
EPIC Encounter for ICM Monitoring  Patient Name: Priscilla Anderson is a 83 y.o. female Date: 03/07/2017 Primary Care Physican: Cathlean Cower, MD Primary Cardiologist: Aundra Dubin Electrophysiologist: Croitoru Dry Weight: unknown Bi-V Pacing:  94.4%       1st ICM encounter. Attempted call to patient and unable to reach.  Left message to return call.  Transmission reviewed.    Thoracic impedance close to baseline today. Impedance has improved since 02/08/2017 compared to the last 6 months.   Prescribed dosage: Furosemide 40 mg 1.5 tablets (60 mg total) daily  Recommendations: NONE - Unable to reach patient   Follow-up plan: ICM clinic phone appointment on 03/28/2017.  Office appointment scheduled on 04/19/2017 with Dr Sallyanne Kuster.  Copy of ICM check sent to device physician.   3 month ICM trend: 03/07/2017   1 Year ICM trend:      Rosalene Billings, RN 03/07/2017 2:51 PM

## 2017-03-07 NOTE — Progress Notes (Signed)
Patient returned call and stated she is doing well.  She stated she feels better since the increase in Furosemide from 40 mg to 60 mg about a month ago.  Reviewed transmission and explained to limit salt intake and continue with prescribed dosage of Furosemide.  Next ICM remote transmission 03/28/2017.  No changes today.

## 2017-03-20 DIAGNOSIS — Z1231 Encounter for screening mammogram for malignant neoplasm of breast: Secondary | ICD-10-CM | POA: Diagnosis not present

## 2017-03-20 LAB — HM MAMMOGRAPHY

## 2017-03-28 ENCOUNTER — Ambulatory Visit (INDEPENDENT_AMBULATORY_CARE_PROVIDER_SITE_OTHER): Payer: Medicare Other

## 2017-03-28 ENCOUNTER — Telehealth: Payer: Self-pay

## 2017-03-28 DIAGNOSIS — Z95810 Presence of automatic (implantable) cardiac defibrillator: Secondary | ICD-10-CM

## 2017-03-28 DIAGNOSIS — I5022 Chronic systolic (congestive) heart failure: Secondary | ICD-10-CM | POA: Diagnosis not present

## 2017-03-28 NOTE — Telephone Encounter (Signed)
Remote ICM transmission received.  Attempted patient call and left message to return call.   

## 2017-03-28 NOTE — Progress Notes (Signed)
EPIC Encounter for ICM Monitoring  Patient Name: Priscilla Anderson is a 83 y.o. female Date: 03/28/2017 Primary Care Physican: Biagio Borg, MD Primary Cardiologist: Aundra Dubin Electrophysiologist: Croitoru Dry Weight:   116.8 lbs Bi-V Pacing:  95.9%             Heart Failure questions reviewed, pt asymptomatic    Thoracic impedance abnormal suggesting fluid accumulation since 03/19/2017. Fluid index <threshold.  Prescribed dosage: Furosemide 40 mg 1.5 tablets (60 mg total) daily  Labs: 02/07/2017 Creatinine 2.05, BUN 33, Potassium 4.1, Sodium 137 10/31/2016 Creatinine 1.96, BUN 39, Potassium 4.2, Sodium 142  Recommendations: Discussed diet and she has not been following low salt diet.  She has been eating foods such as fritos. Advised salty foods will cause fluid accumulation and advised to limit to 2000 mg daily and she agreed to adjust diet.   Follow-up plan: ICM clinic phone appointment on 04/07/2017 to recheck fluid levels.  Office appointment scheduled on 04/19/2017 with Dr Sallyanne Kuster for defib check.  Copy of ICM check sent to primary cardiologist and device physician for review and if any recommendations will call her back.   3 month ICM trend: 03/28/2017   1 Year ICM trend:      Rosalene Billings, RN 03/28/2017 10:00 AM

## 2017-03-29 NOTE — Progress Notes (Signed)
Fritos!!!

## 2017-04-04 ENCOUNTER — Encounter: Payer: Self-pay | Admitting: Internal Medicine

## 2017-04-07 ENCOUNTER — Ambulatory Visit (INDEPENDENT_AMBULATORY_CARE_PROVIDER_SITE_OTHER): Payer: Medicare Other

## 2017-04-07 DIAGNOSIS — I5022 Chronic systolic (congestive) heart failure: Secondary | ICD-10-CM

## 2017-04-07 DIAGNOSIS — Z95810 Presence of automatic (implantable) cardiac defibrillator: Secondary | ICD-10-CM

## 2017-04-07 NOTE — Progress Notes (Signed)
EPIC Encounter for ICM Monitoring  Patient Name: Priscilla Anderson is a 83 y.o. female Date: 04/07/2017 Primary Care Physican: Biagio Borg, MD Primary Cardiologist: Aundra Dubin Electrophysiologist: Croitoru Dry Weight:114 lbs Bi-V Pacing: 94.6%      Heart Failure questions reviewed, pt asymptomatic.   Thoracic impedance returned to normal since she changed her diet and stopped eating so much salt and threw her Fritos in the trash.  Advised her fluids returned to normal since diet change.  Prescribed dosage: Furosemide 40 mg 1.5 tablets (60 mg total) daily  Labs: 02/07/2017 Creatinine 2.05, BUN 33, Potassium 4.1, Sodium 137 10/31/2016 Creatinine 1.96, BUN 39, Potassium 4.2, Sodium 142  Recommendations: No changes.  Encouraged to call for fluid symptoms or use local ER for any urgent symptoms.  Follow-up plan: ICM clinic phone appointment on 05/23/2017.  Office appointment scheduled on 04/19/2017 with Dr Sallyanne Kuster.  Copy of ICM check sent to device physician.   3 month ICM trend: 04/07/2017   1 Year ICM trend:      Rosalene Billings, RN 04/07/2017 3:13 PM

## 2017-04-08 NOTE — Progress Notes (Signed)
There's a song waiting to be written there: " she threw her Fritos in the trash..." MCr

## 2017-04-13 DIAGNOSIS — H6122 Impacted cerumen, left ear: Secondary | ICD-10-CM | POA: Diagnosis not present

## 2017-04-18 NOTE — Progress Notes (Signed)
This encounter was created in error - please disregard.

## 2017-04-19 ENCOUNTER — Encounter: Payer: Self-pay | Admitting: Cardiovascular Disease

## 2017-04-19 ENCOUNTER — Ambulatory Visit (INDEPENDENT_AMBULATORY_CARE_PROVIDER_SITE_OTHER): Payer: Medicare Other | Admitting: Cardiovascular Disease

## 2017-04-19 VITALS — BP 98/58 | HR 70 | Ht 64.0 in | Wt 115.0 lb

## 2017-04-19 DIAGNOSIS — E1122 Type 2 diabetes mellitus with diabetic chronic kidney disease: Secondary | ICD-10-CM | POA: Diagnosis not present

## 2017-04-19 DIAGNOSIS — I420 Dilated cardiomyopathy: Secondary | ICD-10-CM | POA: Diagnosis not present

## 2017-04-19 DIAGNOSIS — Z95810 Presence of automatic (implantable) cardiac defibrillator: Secondary | ICD-10-CM | POA: Diagnosis not present

## 2017-04-19 DIAGNOSIS — N183 Chronic kidney disease, stage 3 unspecified: Secondary | ICD-10-CM

## 2017-04-19 DIAGNOSIS — I5022 Chronic systolic (congestive) heart failure: Secondary | ICD-10-CM

## 2017-04-19 DIAGNOSIS — I059 Rheumatic mitral valve disease, unspecified: Secondary | ICD-10-CM

## 2017-04-19 DIAGNOSIS — E7800 Pure hypercholesterolemia, unspecified: Secondary | ICD-10-CM

## 2017-04-19 NOTE — Patient Instructions (Signed)
Dr Sallyanne Kuster recommends that you continue on your current medications as directed. Please refer to the Current Medication list given to you today.  Remote monitoring is used to monitor your Pacemaker of ICD from home. This monitoring reduces the number of office visits required to check your device to one time per year. It allows [place] to keep an eye on the functioning of your device to ensure it is working properly. You are scheduled for a device check from home on Wednesday, September 5th, 2018. You may send your transmission at any time that day. If you have a wireless device, the transmission will be sent automatically. After your physician reviews your transmission, you will receive a postcard with your next transmission date.  Dr Sallyanne Kuster recommends that you schedule a follow-up appointment in 6 months with a defibrillator check. You will receive a reminder letter in the mail two months in advance. If you don't receive a letter, please call our office to schedule the follow-up appointment.  If you need a refill on your cardiac medications before your next appointment, please call your pharmacy.

## 2017-04-19 NOTE — Progress Notes (Signed)
Patient ID: Priscilla Anderson, female   DOB: [birth year], 83 y.o.   MRN: [id number]    Cardiology Office Note    Date:  04/19/2017   ID:  Priscilla, Anderson 1934/02/07, MRN [id number]  PCP:  Biagio Borg, MD  Cardiologist: Loralie Champagne, MD; Sanda Klein, MD   No chief complaint on file.   History of Present Illness:  Priscilla Anderson is a 83 y.o. female with long-standing nonischemic dilated cardiomyopathy (EF 20% Sep 2016) and severely depressed left ventricular systolic function, moderate-severe mitral regurgitation, mild and nonobstructive CAD  and a biventricular pacemaker/defibrillator who returns in follow-up. She sees Dr. Aundra Dubin in the heart failure clinic.   She had some problems with fluid weight gain confirmed by out-of-range Optivol recordings throughout the winter months. This was due to excessive sodium ingestion (fritos!), but after correcting this she has had a much more stable situation throughout the last 3 months.  The patient specifically denies any chest pain at rest exertion, dyspnea at rest or with exertion, orthopnea, paroxysmal nocturnal dyspnea, syncope, palpitations, focal neurological deficits, intermittent claudication, lower extremity edema, unexplained weight gain, cough, hemoptysis or wheezing. Excursion of her own housework without much limitation. Continues to have generally low blood pressure with systolics in the 92E.  Device interrogation (Medtronic Viva XT CRT-D) today shows normal BiV-ICD function with an estimated generator longevity of 1.6 years. Lead parameters are favorable and stable. Activity level isunchanged at approximately 1 hour per day. She has 96% biventricular pacing (1.4% VSRP) and 63% atrial pacing. She has not had any ventricular tachycardia or atrial fibrillation.    Past Medical History:  Diagnosis Date  . Adenomatous colon polyp 2004  . AICD (automatic cardioverter/defibrillator) present   . CAD (coronary artery disease)   . CHF  (congestive heart failure) ([place])   . Hyperlipidemia   . Hypertension   . Nonischemic cardiomyopathy ([place]) 09/22/2011  . Osteopenia   . Polyp of larynx   . Type II diabetes mellitus ([place])     Past Surgical History:  Procedure Laterality Date  . BIV ICD GENERTAOR CHANGE OUT N/A 07/29/2014   Procedure: BIV ICD GENERTAOR CHANGE OUT;  Surgeon: Sanda Klein, MD;  Location: [facility];  Service: Cardiovascular;  Laterality: N/A;  . BREAST CYST EXCISION Left 1980  . CARDIAC CATHETERIZATION  07/28/2005   noncritical CAD  . CARDIAC CATHETERIZATION     "I've had several"  . CARDIAC DEFIBRILLATOR PLACEMENT  05/21/2002   s/p re-do pacemaker/defibrillator October 2011- [place].  . COLONOSCOPY    . IMPLANTABLE CARDIOVERTER DEFIBRILLATOR GENERATOR CHANGE  09/01/2010   Medtronic  . MICROLARYNGOSCOPY WITH CO2 LASER AND EXCISION OF VOCAL CORD LESION  08/2002   Priscilla Anderson 03/29/2011  . VAGINAL HYSTERECTOMY      Outpatient Medications Prior to Visit  Medication Sig Dispense Refill  . Artificial Tear Solution (SOOTHE XP) SOLN Apply 1 drop to eye daily as needed (dry eyes).    . Ascorbic Acid (VITAMIN C) 500 MG tablet Take 500 mg by mouth daily.      Marland Kitchen aspirin 81 MG tablet Take 81 mg by mouth daily.      . Carboxymethylcellul-Glycerin (REFRESH OPTIVE OP) Apply 1 drop to eye at bedtime.    . carvedilol (COREG) 12.5 MG tablet TAKE 1 TABLET TWICE A DAY 180 tablet 1  . CORLANOR 5 MG TABS tablet TAKE 1 TABLET TWICE A DAY WITH MEALS 180 tablet 1  . diazepam (VALIUM) 5 MG tablet Take 1  tablet (5 mg total) by mouth at bedtime as needed for anxiety. 90 tablet 1  . furosemide (LASIX) 40 MG tablet Take 1.5 tablets (60 mg total) by mouth daily. 135 tablet 3  . glucose blood (ACCU-CHEK AVIVA) test strip Test two times daily. Code 250.02 100 each 11  . ibandronate (BONIVA) 150 MG tablet TAKE 1 TABLET ONCE A MONTH. 9 tablet 3  . JANUVIA 100 MG tablet TAKE 1 TABLET DAILY 90 tablet 1  . Lancets MISC Test as  directed two times daily. Code 250.02 100 each 11  . losartan (COZAAR) 50 MG tablet TAKE 1 TABLET DAILY 90 tablet 2  . metFORMIN (GLUCOPHAGE) 500 MG tablet Take 500 mg by mouth daily with breakfast.    . pantoprazole (PROTONIX) 40 MG tablet Take 1 tablet (40 mg total) by mouth 2 (two) times daily. (Patient taking differently: Take 40 mg by mouth daily. ) 180 tablet 3  . saccharomyces boulardii (FLORASTOR) 250 MG capsule Take 1 capsule (250 mg total) by mouth 2 (two) times daily. 60 capsule 1  . simvastatin (ZOCOR) 20 MG tablet TAKE 1 TABLET AT BEDTIME 90 tablet 1  . spironolactone (ALDACTONE) 25 MG tablet Take 1 tablet (25 mg total) by mouth daily. 90 tablet 2  . predniSONE (DELTASONE) 10 MG tablet 2 tabs by mouth per day for 5 days, (Patient not taking: Reported on 04/19/2017) 10 tablet 0  . Wheat Dextrin (BENEFIBER) POWD Take by mouth. 2 tablespoons once daily     No facility-administered medications prior to visit.      Allergies:   Lanoxin [digoxin]   Social History   Social History  . Marital status: Widowed    Spouse name: N/A  . Number of children: 2  . Years of education: N/A   Occupational History  . retired Investment banker, corporate. report clerk Retired   Social History Main Topics  . Smoking status: Never Smoker  . Smokeless tobacco: Never Used  . Alcohol use No  . Drug use: Yes    Frequency: 2.0 times per week  . Sexual activity: Not on file   Other Topics Concern  . Not on file   Social History Narrative   Husband now with Multiple Myeloma- see Dr. Juanita Craver     Family History:  The patient's family history includes Arthritis in her father and mother; Diabetes in her other; Heart disease in her brother; Prostate cancer in her brother.   ROS:   Please see the history of present illness.    ROS All other systems reviewed and are negative.   PHYSICAL EXAM:   VS:  BP (!) 98/58 (BP Location: Right Arm, Patient Position: Sitting, Cuff Size: Normal)   Pulse 70    Ht 5' 4"  (1.626 m)   Wt 115 lb (52.2 kg)   BMI 19.74 kg/m     General: Well nourished, well developed, Alert, oriented x3, no distress Head: no evidence of trauma, PERRL, EOMI, no exophtalmos or lid lag, no myxedema, no xanthelasma; normal ears, nose and oropharynx Neck: normal jugular venous pulsations and no hepatojugular reflux; brisk carotid pulses without delay and no carotid bruits Chest: clear to auscultation, no signs of consolidation by percussion or palpation, normal fremitus, symmetrical and full respiratory excursions Cardiovascular: Laterally displaced apical impulse, paradoxically split S2, RRR; grade 1/6 holosystolic apical murmur no diastolic murmurs, rubs, or gallops,no edema  Abdomen: no tenderness or distention, no masses by palpation, no abnormal pulsatility or arterial bruits, normal bowel sounds, no hepatosplenomegaly  Extremities: no clubbing, cyanosis or edema; 2+ radial, ulnar and brachial pulses bilaterally; 2+ right femoral, posterior tibial and dorsalis pedis pulses; 2+ left femoral, posterior tibial and dorsalis pedis pulses; no subclavian or femoral bruits Neurological: grossly nonfocal Psych: euthymic mood, full affect  Wt Readings from Last 3 Encounters:  04/19/17 115 lb (52.2 kg)  02/28/17 114 lb (51.7 kg)  02/07/17 116 lb (52.6 kg)      Studies/Labs Reviewed:   EKG:  EKG is ordered today.  It shows atrial paced ventricular paced rhythm with prominent R waves in lead V1 consistent with effective LV pacing  Recent Labs: 08/10/2016: Hemoglobin 13.2; Platelets 186.0 10/31/2016: Brain Natriuretic Peptide 792.5 02/07/2017: ALT 8; BUN 33; Creatinine, Ser 2.05; Potassium 4.1; Sodium 137   Lipid Panel    Component Value Date/Time   CHOL 128 02/07/2017 1159   TRIG 67.0 02/07/2017 1159   HDL 50.70 02/07/2017 1159   CHOLHDL 3 02/07/2017 1159   VLDL 13.4 02/07/2017 1159   LDLCALC 64 02/07/2017 1159    ASSESSMENT:    1. Chronic systolic congestive heart  failure, NYHA class 1 ([place])   2. Nonischemic dilated cardiomyopathy ([place])   3. Mitral valve disorder   4. Biventricular ICD (implantable cardioverter-defibrillator) in place   5. Chronic renal insufficiency, stage III (moderate)   6. Pure hypercholesterolemia   7. Controlled type 2 diabetes mellitus with stage 3 chronic kidney disease, without long-term current use of insulin (HCC)      PLAN:  In order of problems listed above:  1. CHF: Clinically euvolemic, improved Optivolstable for last 3 months.  NYHA functional class I-II. Went over sodium restriction and daily weight monitoring again. Her blood pressure does not allow escalation of her heart failure medications (on carvedilol, losartan, spironolactone, furosemide as well as corlanor). Lowest recent recorded BNP was 792, but she was probably a little "wet" when that was drawn in December 2017. 2. CMP:  EF 20% by echo, 17% by scintigraphy. Moderate nonobstructive coronary artery disease by previous cath. Anterior scar without ischemia on nuclear study recently 3. MR: This is likely functional, related to annular dilatation, since it severity appears to fluctuate with the degree of heart failure decompensation.  4. CRT-D: Her device has never delivered therapies for tachycardia.unchanged percentage of by the pacing at 96%. CareLink download every 3 months and office visit in 6 months. 5. CKD stage 3-4: Creatinine around baseline of 1.8-2.0  6. HLP: Excellent recent lipid profile  7. DM: Improved glycemic control, most recent hemoglobin A1c at goal 6.8%  .     Medication Adjustments/Labs and Tests Ordered: Current medicines are reviewed at length with the patient today.  Concerns regarding medicines are outlined above.  Medication changes, Labs and Tests ordered today are listed in the Patient Instructions below. Patient Instructions  Dr Sallyanne Kuster recommends that you continue on your current medications as directed. Please refer to the  Current Medication list given to you today.  Remote monitoring is used to monitor your Pacemaker of ICD from home. This monitoring reduces the number of office visits required to check your device to one time per year. It allows [place] to keep an eye on the functioning of your device to ensure it is working properly. You are scheduled for a device check from home on Wednesday, September 5th, 2018. You may send your transmission at any time that day. If you have a wireless device, the transmission will be sent automatically. After your physician reviews your transmission, you will  receive a postcard with your next transmission date.  Dr Sallyanne Kuster recommends that you schedule a follow-up appointment in 6 months with a defibrillator check. You will receive a reminder letter in the mail two months in advance. If you don't receive a letter, please call our office to schedule the follow-up appointment.  If you need a refill on your cardiac medications before your next appointment, please call your pharmacy. Signed, Sanda Klein, MD  04/19/2017 1:39 PM    [facility] [address], [place], [place]  [address] Phone: [phone]; Fax: [phone]

## 2017-04-25 ENCOUNTER — Other Ambulatory Visit: Payer: Self-pay | Admitting: Cardiovascular Disease

## 2017-04-27 LAB — CUP PACEART INCLINIC DEVICE CHECK
Battery Voltage: 2.93 V
Brady Statistic AP VP Percent: 62.84 %
Brady Statistic RV Percent Paced: 94.82 %
Date Time Interrogation Session: 20180606134907
HIGH POWER IMPEDANCE MEASURED VALUE: 39 Ohm
HighPow Impedance: 57 Ohm
Implantable Lead Implant Date: 20030708
Implantable Lead Implant Date: 20070501
Implantable Lead Implant Date: 20070503
Implantable Lead Location: 753859
Implantable Lead Location: 753860
Implantable Lead Location: 753860
Implantable Lead Model: 5076
Lead Channel Impedance Value: 4047 Ohm
Lead Channel Impedance Value: 456 Ohm
Lead Channel Pacing Threshold Amplitude: 0.5 V
Lead Channel Pacing Threshold Pulse Width: 0.4 ms
Lead Channel Sensing Intrinsic Amplitude: 3.625 mV
Lead Channel Setting Pacing Amplitude: 2.5 V
Lead Channel Setting Sensing Sensitivity: 0.3 mV
MDC IDC LEAD IMPLANT DT: 20111019
MDC IDC LEAD LOCATION: 753858
MDC IDC MSMT BATTERY REMAINING LONGEVITY: 20 mo
MDC IDC MSMT LEADCHNL LV IMPEDANCE VALUE: 342 Ohm
MDC IDC MSMT LEADCHNL LV IMPEDANCE VALUE: 4047 Ohm
MDC IDC MSMT LEADCHNL LV PACING THRESHOLD AMPLITUDE: 1.25 V
MDC IDC MSMT LEADCHNL LV PACING THRESHOLD PULSEWIDTH: 0.4 ms
MDC IDC MSMT LEADCHNL RA PACING THRESHOLD PULSEWIDTH: 0.4 ms
MDC IDC MSMT LEADCHNL RA SENSING INTR AMPL: 4.625 mV
MDC IDC MSMT LEADCHNL RV IMPEDANCE VALUE: 342 Ohm
MDC IDC MSMT LEADCHNL RV IMPEDANCE VALUE: 456 Ohm
MDC IDC MSMT LEADCHNL RV PACING THRESHOLD AMPLITUDE: 1.25 V
MDC IDC MSMT LEADCHNL RV SENSING INTR AMPL: 17.625 mV
MDC IDC MSMT LEADCHNL RV SENSING INTR AMPL: 17.625 mV
MDC IDC PG IMPLANT DT: 20150915
MDC IDC SET LEADCHNL LV PACING AMPLITUDE: 2.25 V
MDC IDC SET LEADCHNL LV PACING PULSEWIDTH: 0.4 ms
MDC IDC SET LEADCHNL RA PACING AMPLITUDE: 2 V
MDC IDC SET LEADCHNL RV PACING PULSEWIDTH: 0.4 ms
MDC IDC STAT BRADY AP VS PERCENT: 0.14 %
MDC IDC STAT BRADY AS VP PERCENT: 35.62 %
MDC IDC STAT BRADY AS VS PERCENT: 1.4 %
MDC IDC STAT BRADY RA PERCENT PACED: 59.68 %

## 2017-05-03 NOTE — Progress Notes (Signed)
Pre visit review using our clinic review tool, if applicable. No additional management support is needed unless otherwise documented below in the visit note. 

## 2017-05-03 NOTE — Progress Notes (Addendum)
Subjective:   Priscilla Anderson is a 83 y.o. female who presents for an Initial Medicare Annual Wellness Visit.  Review of Systems    No ROS.  Medicare Wellness Visit. Additional risk factors are reflected in the social history.   Cardiac Risk Factors include: advanced age (>46men, >23 women);diabetes mellitus;dyslipidemia Sleep patterns: feels rested on waking, gets up 1 times nightly to void and sleeps 7-8 hours nightly.   Home Safety/Smoke Alarms: Feels safe in home. Smoke alarms in place.  Living environment; residence and Firearm Safety: 2-story house, no firearms. Lives alone, no needs for DME, good family and church support Seat Belt Safety/Bike Helmet: Wears seat belt.   Counseling:   Eye Exam- appointment yearly Dental- appointment yearly   Female:   Pap-  N/A     Mammo- Last 03/20/17, BI-RADS category 2: benign     Dexa scan- Last 09/03/14, low BMD    CCS- N/A     Objective:    Today's Vitals   05/05/17 1538 05/05/17 1600  BP: (!) 94/50 (!) 96/54  Pulse: 69   Resp: 20   SpO2: 97%   Weight: 112 lb (50.8 kg)   Height: 5\' 4"  (1.626 m)    Body mass index is 19.22 kg/m.   Current Medications (verified) Outpatient Encounter Prescriptions as of 05/05/2017  Medication Sig  . Artificial Tear Solution (SOOTHE XP) SOLN Apply 1 drop to eye daily as needed (dry eyes).  . Ascorbic Acid (VITAMIN C) 500 MG tablet Take 500 mg by mouth daily.    Marland Kitchen aspirin 81 MG tablet Take 81 mg by mouth daily.    . carvedilol (COREG) 12.5 MG tablet TAKE 1 TABLET TWICE A DAY  . CORLANOR 5 MG TABS tablet TAKE 1 TABLET TWICE A DAY WITH MEALS  . diazepam (VALIUM) 5 MG tablet Take 1 tablet (5 mg total) by mouth at bedtime as needed for anxiety.  . furosemide (LASIX) 40 MG tablet Take 1.5 tablets (60 mg total) by mouth daily.  Marland Kitchen glucose blood (ACCU-CHEK AVIVA) test strip Test two times daily. Code 250.02  . ibandronate (BONIVA) 150 MG tablet TAKE 1 TABLET ONCE A MONTH.  Marland Kitchen JANUVIA 100 MG tablet  TAKE 1 TABLET DAILY  . Lancets MISC Test as directed two times daily. Code 250.02  . losartan (COZAAR) 50 MG tablet TAKE 1 TABLET DAILY  . metFORMIN (GLUCOPHAGE) 500 MG tablet Take 500 mg by mouth daily with breakfast.  . pantoprazole (PROTONIX) 40 MG tablet Take 1 tablet (40 mg total) by mouth 2 (two) times daily. (Patient taking differently: Take 40 mg by mouth daily. )  . saccharomyces boulardii (FLORASTOR) 250 MG capsule Take 1 capsule (250 mg total) by mouth 2 (two) times daily.  . Simethicone (GAS RELIEF) 180 MG CAPS Take 180 mg by mouth 2 (two) times daily.  . simvastatin (ZOCOR) 20 MG tablet TAKE 1 TABLET AT BEDTIME  . spironolactone (ALDACTONE) 25 MG tablet Take 1 tablet (25 mg total) by mouth daily.  . [DISCONTINUED] Carboxymethylcellul-Glycerin (REFRESH OPTIVE OP) Apply 1 drop to eye at bedtime.   No facility-administered encounter medications on file as of 05/05/2017.     Allergies (verified) Lanoxin [digoxin]   History: Past Medical History:  Diagnosis Date  . Adenomatous colon polyp 2004  . AICD (automatic cardioverter/defibrillator) present   . CAD (coronary artery disease)   . CHF (congestive heart failure) ([place])   . Hyperlipidemia   . Hypertension   . Nonischemic cardiomyopathy ([place]) 09/22/2011  .  Osteopenia   . Polyp of larynx   . Type II diabetes mellitus ([place])    Past Surgical History:  Procedure Laterality Date  . BIV ICD GENERTAOR CHANGE OUT N/A 07/29/2014   Procedure: BIV ICD GENERTAOR CHANGE OUT;  Surgeon: Sanda Klein, MD;  Location: [facility];  Service: Cardiovascular;  Laterality: N/A;  . BREAST CYST EXCISION Left 1980  . CARDIAC CATHETERIZATION  07/28/2005   noncritical CAD  . CARDIAC CATHETERIZATION     "I've had several"  . CARDIAC DEFIBRILLATOR PLACEMENT  05/21/2002   s/p re-do pacemaker/defibrillator October 2011- [place].  . COLONOSCOPY    . IMPLANTABLE CARDIOVERTER DEFIBRILLATOR GENERATOR CHANGE  09/01/2010   Medtronic  .  MICROLARYNGOSCOPY WITH CO2 LASER AND EXCISION OF VOCAL CORD LESION  08/2002   Archie Endo 03/29/2011  . VAGINAL HYSTERECTOMY     Family History  Problem Relation Age of Onset  . Arthritis Mother   . Arthritis Father   . Heart disease Brother        CABG  . Prostate cancer Brother        Prostate problem with up and down PSA  . Diabetes Other        1st degree   Social History   Occupational History  . retired Investment banker, corporate. report clerk Retired   Social History Main Topics  . Smoking status: Never Smoker  . Smokeless tobacco: Never Used  . Alcohol use No  . Drug use: Yes    Frequency: 2.0 times per week  . Sexual activity: Not on file    Tobacco Counseling Counseling given: Not Answered   Activities of Daily Living In your present state of health, do you have any difficulty performing the following activities: 05/05/2017  Hearing? N  Vision? N  Difficulty concentrating or making decisions? N  Walking or climbing stairs? N  Dressing or bathing? N  Doing errands, shopping? N  Preparing Food and eating ? N  Using the Toilet? N  In the past six months, have you accidently leaked urine? N  Do you have problems with loss of bowel control? N  Managing your Medications? N  Managing your Finances? N  Housekeeping or managing your Housekeeping? N  Some recent data might be hidden    Immunizations and Health Maintenance Immunization History  Administered Date(s) Administered  . Influenza Split 08/20/2012  . Influenza Whole 08/14/2008  . Influenza, High Dose Seasonal PF 09/03/2013  . Influenza-Unspecified 08/25/2014  . Pneumococcal Conjugate-13 03/04/2014  . Pneumococcal Polysaccharide-23 08/16/2004, 04/21/2009  . Td 05/14/1998, 10/20/2008   Health Maintenance Due  Topic Date Due  . FOOT EXAM  03/05/2015  . OPHTHALMOLOGY EXAM  05/05/2017    Patient Care Team: Biagio Borg, MD as PCP - General [facility] Plan Pricilla Riffle, MD as Consulting Physician (Gastroenterology) [place],  Dani Gobble, MD as Consulting Physician (Cardiology)  Indicate any recent Medical Services you may have received from other than [facility] providers in the past year (date may be approximate).     Assessment:   This is a routine wellness examination for [place].Physical assessment deferred to PCP.   Hearing/Vision screen Hearing Screening Comments: Able to hear conversational tones w/o difficulty. No issues reported.    Dietary issues and exercise activities discussed: Current Exercise Habits: Home exercise routine, Type of exercise: walking, Time (Minutes): 30, Frequency (Times/Week): 6, Weekly Exercise (Minutes/Week): 180, Intensity: Mild, Exercise limited by: None identified  Diet (meal preparation, eat out, water intake, caffeinated beverages, dairy products, fruits and vegetables):  in general, a "healthy" diet  , well balanced, diabetic, low fat/ cholesterol, low salt eats a variety of fruits and vegetables daily, limits salt, fat/cholesterol, sugar, caffeine, drinks 6-8 glasses of water daily.   Discussed supplementing with Glucerna, encouraged patient to increase daily water intake.   Goals    . Maintian current helath status          Continue to be as active and independent as possible. Stay as involved with my church.      Depression Screen PHQ 2/9 Scores 05/05/2017 02/07/2017 01/07/2016 01/06/2014  PHQ - 2 Score 0 0 0 0    Fall Risk Fall Risk  05/05/2017 02/07/2017 01/07/2016 01/06/2014  Falls in the past year? Yes No No No  Number falls in past yr: 1 - - -  Injury with Fall? No - - -  Follow up Falls prevention discussed - - -    Cognitive Function: MMSE - Mini Mental State Exam 05/05/2017  Orientation to time 5  Orientation to Place 5  Registration 3  Attention/ Calculation 4  Recall 2  Language- name 2 objects 2  Language- repeat 1  Language- follow 3 step command 3  Language- read & follow direction 1  Write a sentence 1  Copy design 1  Total score 28         Screening Tests Health Maintenance  Topic Date Due  . FOOT EXAM  03/05/2015  . OPHTHALMOLOGY EXAM  05/05/2017  . INFLUENZA VACCINE  06/14/2017  . HEMOGLOBIN A1C  08/10/2017  . TETANUS/TDAP  10/20/2018  . DEXA SCAN  Completed  . PNA vac Low Risk Adult  Completed      Plan:    Continue to eat heart healthy diet (full of fruits, vegetables, whole grains, lean protein, water--limit salt, fat, and sugar intake) and increase physical activity as tolerated.  Continue doing brain stimulating activities (puzzles, reading, adult coloring books, staying active) to keep memory sharp.   I have personally reviewed and noted the following in the patient's chart:   . Medical and social history . Use of alcohol, tobacco or illicit drugs  . Current medications and supplements . Functional ability and status . Nutritional status . Physical activity . Advanced directives . List of other physicians . Vitals . Screenings to include cognitive, depression, and falls . Referrals and appointments  In addition, I have reviewed and discussed with patient certain preventive protocols, quality metrics, and best practice recommendations. A written personalized care plan for preventive services as well as general preventive health recommendations were provided to patient.     Michiel Cowboy, RN   05/05/2017   Medical screening examination/treatment/procedure(s) were performed by non-physician practitioner and as supervising physician I was immediately available for consultation/collaboration. I agree with above. Cathlean Cower, MD

## 2017-05-05 ENCOUNTER — Ambulatory Visit (INDEPENDENT_AMBULATORY_CARE_PROVIDER_SITE_OTHER): Payer: Medicare Other | Admitting: *Deleted

## 2017-05-05 VITALS — BP 96/54 | HR 69 | Resp 20 | Ht 64.0 in | Wt 112.0 lb

## 2017-05-05 DIAGNOSIS — Z0000 Encounter for general adult medical examination without abnormal findings: Secondary | ICD-10-CM

## 2017-05-05 NOTE — Patient Instructions (Addendum)
Continue to eat heart healthy diet (full of fruits, vegetables, whole grains, lean protein, water--limit salt, fat, and sugar intake) and increase physical activity as tolerated.  Continue doing brain stimulating activities (puzzles, reading, adult coloring books, staying active) to keep memory sharp.   Eat activa yogurt, good protein source and good cultures.  Priscilla Anderson , Thank you for taking time to come for your Medicare Wellness Visit. I appreciate your ongoing commitment to your health goals. Please review the following plan we discussed and let me know if I can assist you in the future.   These are the goals we discussed: Goals    . Maintian current helath status          Continue to be as active and independent as possible. Stay as involved with my church.       This is a list of the screening recommended for you and due dates:  Health Maintenance  Topic Date Due  . Complete foot exam   03/05/2015  . Eye exam for diabetics  05/05/2017  . Flu Shot  06/14/2017  . Hemoglobin A1C  08/10/2017  . Tetanus Vaccine  10/20/2018  . DEXA scan (bone density measurement)  Completed  . Pneumonia vaccines  Completed    Food Choices for Gastroesophageal Reflux Disease, Adult When you have gastroesophageal reflux disease (GERD), the foods you eat and your eating habits are very important. Choosing the right foods can help ease your discomfort. What guidelines do I need to follow?  Choose fruits, vegetables, whole grains, and low-fat dairy products.  Choose low-fat meat, fish, and poultry.  Limit fats such as oils, salad dressings, butter, nuts, and avocado.  Keep a food diary. This helps you identify foods that cause symptoms.  Avoid foods that cause symptoms. These may be different for everyone.  Eat small meals often instead of 3 large meals a day.  Eat your meals slowly, in a place where you are relaxed.  Limit fried foods.  Cook foods using methods other than  frying.  Avoid drinking alcohol.  Avoid drinking large amounts of liquids with your meals.  Avoid bending over or lying down until 2-3 hours after eating. What foods are not recommended? These are some foods and drinks that may make your symptoms worse: Vegetables Tomatoes. Tomato juice. Tomato and spaghetti sauce. Chili peppers. Onion and garlic. Horseradish. Fruits Oranges, grapefruit, and lemon (fruit and juice). Meats High-fat meats, fish, and poultry. This includes hot dogs, ribs, ham, sausage, salami, and bacon. Dairy Whole milk and chocolate milk. Sour cream. Cream. Butter. Ice cream. Cream cheese. Drinks Coffee and tea. Bubbly (carbonated) drinks or energy drinks. Condiments Hot sauce. Barbecue sauce. Sweets/Desserts Chocolate and cocoa. Donuts. Peppermint and spearmint. Fats and Oils High-fat foods. This includes [place] fries and potato chips. Other Vinegar. Strong spices. This includes black pepper, white pepper, red pepper, cayenne, curry powder, cloves, ginger, and chili powder. The items listed above may not be a complete list of foods and drinks to avoid. Contact your dietitian for more information. This information is not intended to replace advice given to you by your health care provider. Make sure you discuss any questions you have with your health care provider. Document Released: 05/01/2012 Document Revised: 04/07/2016 Document Reviewed: 09/04/2013 Elsevier Interactive Patient Education  2017 [employer].

## 2017-05-08 ENCOUNTER — Ambulatory Visit (INDEPENDENT_AMBULATORY_CARE_PROVIDER_SITE_OTHER): Payer: Medicare Other | Admitting: Ophthalmology

## 2017-05-08 DIAGNOSIS — E11319 Type 2 diabetes mellitus with unspecified diabetic retinopathy without macular edema: Secondary | ICD-10-CM | POA: Diagnosis not present

## 2017-05-08 DIAGNOSIS — H35033 Hypertensive retinopathy, bilateral: Secondary | ICD-10-CM

## 2017-05-08 DIAGNOSIS — I10 Essential (primary) hypertension: Secondary | ICD-10-CM | POA: Diagnosis not present

## 2017-05-08 DIAGNOSIS — H43813 Vitreous degeneration, bilateral: Secondary | ICD-10-CM

## 2017-05-08 DIAGNOSIS — E113293 Type 2 diabetes mellitus with mild nonproliferative diabetic retinopathy without macular edema, bilateral: Secondary | ICD-10-CM | POA: Diagnosis not present

## 2017-05-08 LAB — HM DIABETES EYE EXAM

## 2017-05-09 ENCOUNTER — Emergency Department (HOSPITAL_COMMUNITY)
Admission: EM | Admit: 2017-05-09 | Discharge: 2017-05-09 | Disposition: A | Discharge status: Home / Self Care | Payer: Medicare Other | Attending: Emergency Medicine | Admitting: Emergency Medicine

## 2017-05-09 ENCOUNTER — Encounter (HOSPITAL_COMMUNITY): Payer: Self-pay | Admitting: Emergency Medicine

## 2017-05-09 ENCOUNTER — Emergency Department (HOSPITAL_COMMUNITY): Payer: Medicare Other

## 2017-05-09 DIAGNOSIS — Y9289 Other specified places as the place of occurrence of the external cause: Secondary | ICD-10-CM | POA: Diagnosis not present

## 2017-05-09 DIAGNOSIS — I2510 Atherosclerotic heart disease of native coronary artery without angina pectoris: Secondary | ICD-10-CM | POA: Insufficient documentation

## 2017-05-09 DIAGNOSIS — S99912A Unspecified injury of left ankle, initial encounter: Secondary | ICD-10-CM | POA: Diagnosis present

## 2017-05-09 DIAGNOSIS — Y939 Activity, unspecified: Secondary | ICD-10-CM | POA: Insufficient documentation

## 2017-05-09 DIAGNOSIS — W19XXXA Unspecified fall, initial encounter: Secondary | ICD-10-CM | POA: Insufficient documentation

## 2017-05-09 DIAGNOSIS — S93492A Sprain of other ligament of left ankle, initial encounter: Secondary | ICD-10-CM | POA: Diagnosis not present

## 2017-05-09 DIAGNOSIS — Z79899 Other long term (current) drug therapy: Secondary | ICD-10-CM | POA: Diagnosis not present

## 2017-05-09 DIAGNOSIS — I110 Hypertensive heart disease with heart failure: Secondary | ICD-10-CM | POA: Diagnosis not present

## 2017-05-09 DIAGNOSIS — Z7984 Long term (current) use of oral hypoglycemic drugs: Secondary | ICD-10-CM | POA: Diagnosis not present

## 2017-05-09 DIAGNOSIS — I5022 Chronic systolic (congestive) heart failure: Secondary | ICD-10-CM | POA: Diagnosis not present

## 2017-05-09 DIAGNOSIS — Z7982 Long term (current) use of aspirin: Secondary | ICD-10-CM | POA: Insufficient documentation

## 2017-05-09 DIAGNOSIS — Y999 Unspecified external cause status: Secondary | ICD-10-CM | POA: Insufficient documentation

## 2017-05-09 DIAGNOSIS — M25572 Pain in left ankle and joints of left foot: Secondary | ICD-10-CM

## 2017-05-09 DIAGNOSIS — E1129 Type 2 diabetes mellitus with other diabetic kidney complication: Secondary | ICD-10-CM | POA: Diagnosis not present

## 2017-05-09 DIAGNOSIS — M7989 Other specified soft tissue disorders: Secondary | ICD-10-CM | POA: Diagnosis not present

## 2017-05-09 NOTE — Discharge Instructions (Signed)
Please use the Ace wrap for compression and use elevation and ice therapy. Please follow-up with your PCP for further management. We did not see evidence of fracture on imaging. If any symptoms change or worsen, please return to the nearest emergency department.

## 2017-05-09 NOTE — ED Triage Notes (Addendum)
Pt c/o left foot swelling and painful walking onset 04/30/17 after fall. Treating with rest, ice, elevation, and Tylenol, which initially resolved swelling and pain, but swelling and tingling returned on Sunday after wearing high heels all day, pt ambulatory with a limp.  Pedal pulse 2+. Left foot normothermic with nonpitting edema. Reduced ROM to left 3rd, 4th, and 5th toes.

## 2017-05-09 NOTE — ED Provider Notes (Signed)
[facility] DEPT Provider Note   CSN: [id number] Arrival date & time: 05/09/17  5784     History   Chief Complaint Chief Complaint  Patient presents with  . Foot Swelling    HPI Priscilla Anderson is a 83 y.o. female.  The history is provided by the patient and medical records. No language interpreter was used.  Ankle Pain   The incident occurred more than 1 week ago. Incident location: at [facility]. The injury mechanism was a fall. The pain is present in the left ankle. The quality of the pain is described as aching. The pain is at a severity of 4/10. The pain is moderate. The pain has been improving since onset. Pertinent negatives include no numbness, no inability to bear weight, no muscle weakness, no loss of sensation and no tingling. She reports no foreign bodies present. The symptoms are aggravated by bearing weight and palpation. She has tried ice, elevation and immobilization for the symptoms. The treatment provided moderate relief.    Past Medical History:  Diagnosis Date  . Adenomatous colon polyp 2004  . AICD (automatic cardioverter/defibrillator) present   . CAD (coronary artery disease)   . CHF (congestive heart failure) ([place])   . Hyperlipidemia   . Hypertension   . Nonischemic cardiomyopathy ([place]) 09/22/2011  . Osteopenia   . Polyp of larynx   . Type II diabetes mellitus [facility])     Patient Active Problem List   Diagnosis Date Noted  . Cough 12/14/2016  . Dyspepsia 09/21/2016  . Acute upper respiratory infection 02/12/2016  . Abdominal pain 01/07/2016  . Ventricular tachycardia ([place]) 12/16/2015  . Mitral valve disorder 08/17/2015  . Chronic systolic CHF (congestive heart failure) ([place]) 08/17/2015  . Chronic renal insufficiency, stage III (moderate) 08/04/2015  . Dyspnea 07/08/2015  . UTI (urinary tract infection) 11/20/2014  . Loss of weight 11/20/2014  . Anginal chest pain at rest [facility]) 11/11/2014  . Type 2 diabetes mellitus with renal manifestations,  controlled ([place]) 11/11/2014  . Chest pain at rest 11/11/2014  . Vaginal disorder 09/02/2014  . Hoarseness 01/14/2014  . Abnormal urine odor 03/25/2012  . Toe pain 12/25/2011  . Moderate mitral regurgitation by prior echocardiogram 09/22/2011  . Nonischemic dilated cardiomyopathy ([place]) 09/22/2011  . Biventricular ICD (implantable cardioverter-defibrillator) in place 09/22/2011  . Recent head trauma 03/21/2011  . Preventative health care 03/17/2011  . Anemia 09/20/2010  . Anxiety state 12/18/2009  . DEPRESSION 12/18/2009  . INSOMNIA-SLEEP DISORDER-UNSPEC 12/18/2009  . FATIGUE 04/21/2008  . Coronary atherosclerosis 09/26/2007  . Allergic rhinitis 09/26/2007  . DIVERTICULOSIS, COLON 09/26/2007  . DVT, HX OF 09/26/2007  . Hyperlipidemia 06/07/2007  . Essential hypertension 06/07/2007  . Chronic systolic congestive heart failure, NYHA class 1 ([place]) 06/07/2007  . Osteoporosis 06/07/2007  . COLONIC POLYPS, HX OF 06/07/2007    Past Surgical History:  Procedure Laterality Date  . BIV ICD GENERTAOR CHANGE OUT N/A 07/29/2014   Procedure: BIV ICD GENERTAOR CHANGE OUT;  Surgeon: Sanda Klein, MD;  Location: [facility] CATH LAB;  Service: Cardiovascular;  Laterality: N/A;  . BREAST CYST EXCISION Left 1980  . CARDIAC CATHETERIZATION  07/28/2005   noncritical CAD  . CARDIAC CATHETERIZATION     "I've had several"  . CARDIAC DEFIBRILLATOR PLACEMENT  05/21/2002   s/p re-do pacemaker/defibrillator October 2011- [place].  . COLONOSCOPY    . IMPLANTABLE CARDIOVERTER DEFIBRILLATOR GENERATOR CHANGE  09/01/2010   Medtronic  . MICROLARYNGOSCOPY WITH CO2 LASER AND EXCISION OF VOCAL CORD LESION  08/2002   /  notes 03/29/2011  . VAGINAL HYSTERECTOMY      OB History    No data available       Home Medications    Prior to Admission medications   Medication Sig Start Date End Date Taking? Authorizing Provider  Artificial Tear Solution (SOOTHE XP) SOLN Apply 1 drop to eye daily as needed (dry  eyes).    [provider]  Ascorbic Acid (VITAMIN C) 500 MG tablet Take 500 mg by mouth daily.      [provider]  aspirin 81 MG tablet Take 81 mg by mouth daily.      [provider]  carvedilol (COREG) 12.5 MG tablet TAKE 1 TABLET TWICE A DAY 11/15/16   Croitoru, Mihai, MD  CORLANOR 5 MG TABS tablet TAKE 1 TABLET TWICE A DAY WITH MEALS 11/21/16   Larey Dresser, MD  diazepam (VALIUM) 5 MG tablet Take 1 tablet (5 mg total) by mouth at bedtime as needed for anxiety. 02/07/17   Biagio Borg, MD  furosemide (LASIX) 40 MG tablet Take 1.5 tablets (60 mg total) by mouth daily. 02/07/17   Biagio Borg, MD  glucose blood (ACCU-CHEK AVIVA) test strip Test two times daily. Code 250.02 12/20/11   Biagio Borg, MD  ibandronate (BONIVA) 150 MG tablet TAKE 1 TABLET ONCE A MONTH. 11/24/16   Biagio Borg, MD  JANUVIA 100 MG tablet TAKE 1 TABLET DAILY 01/24/17   Biagio Borg, MD  Lancets MISC Test as directed two times daily. Code 250.02 12/20/11   Biagio Borg, MD  losartan (COZAAR) 50 MG tablet TAKE 1 TABLET DAILY 01/27/17   Croitoru, Dani Gobble, MD  metFORMIN (GLUCOPHAGE) 500 MG tablet Take 500 mg by mouth daily with breakfast.    [provider]  pantoprazole (PROTONIX) 40 MG tablet Take 1 tablet (40 mg total) by mouth 2 (two) times daily. Patient taking differently: Take 40 mg by mouth daily.  11/02/16   Biagio Borg, MD  saccharomyces boulardii (FLORASTOR) 250 MG capsule Take 1 capsule (250 mg total) by mouth 2 (two) times daily. 09/21/16   Biagio Borg, MD  Simethicone (GAS RELIEF) 180 MG CAPS Take 180 mg by mouth 2 (two) times daily.    [provider]  simvastatin (ZOCOR) 20 MG tablet TAKE 1 TABLET AT BEDTIME 12/26/16   Croitoru, Mihai, MD  spironolactone (ALDACTONE) 25 MG tablet Take 1 tablet (25 mg total) by mouth daily. 01/10/17   Croitoru, Dani Gobble, MD    Family History Family History  Problem Relation Age of Onset  . Arthritis Mother   . Arthritis Father   .  Heart disease Brother        CABG  . Prostate cancer Brother        Prostate problem with up and down PSA  . Diabetes Other        1st degree    Social History Social History  Substance Use Topics  . Smoking status: Never Smoker  . Smokeless tobacco: Never Used  . Alcohol use No     Allergies   Lanoxin [digoxin]   Review of Systems Review of Systems  Constitutional: Negative for chills, fatigue and fever.  HENT: Negative for congestion.   Respiratory: Negative for cough, chest tightness and shortness of breath.   Cardiovascular: Negative for chest pain and palpitations.  Gastrointestinal: Negative for abdominal pain, diarrhea, nausea and vomiting.  Genitourinary: Negative for dysuria.  Musculoskeletal: Negative for back pain and neck pain.  Skin: Negative for rash and wound.  Neurological: Negative for tingling, numbness and headaches.  Psychiatric/Behavioral: Negative for agitation.  All other systems reviewed and are negative.    Physical Exam Updated Vital Signs BP 106/65 (BP Location: Right Arm)   Pulse 73   Temp 97.8 F (36.6 C) (Oral)   Resp 16   SpO2 98%   Physical Exam  Constitutional: She appears well-developed and well-nourished. No distress.  HENT:  Head: Normocephalic and atraumatic.  Mouth/Throat: No oropharyngeal exudate.  Eyes: Conjunctivae are normal. Pupils are equal, round, and reactive to light.  Neck: Normal range of motion. Neck supple.  Cardiovascular: Normal rate, regular rhythm and intact distal pulses.   No murmur heard. Pulmonary/Chest: Effort normal and breath sounds normal. No stridor. No respiratory distress. She has no wheezes. She exhibits no tenderness.  Abdominal: Soft. There is no tenderness.  Musculoskeletal: Normal range of motion. She exhibits edema and tenderness. She exhibits no deformity.       Left ankle: She exhibits swelling. She exhibits normal range of motion and no deformity. Tenderness. AITFL tenderness found.        Feet:  Neurological: She is alert.  Skin: Skin is warm and dry. Capillary refill takes less than 2 seconds. No rash noted. No erythema.  Psychiatric: She has a normal mood and affect.  Nursing note and vitals reviewed.    ED Treatments / Results  Labs (all labs ordered are listed, but only abnormal results are displayed) Labs Reviewed - No data to display  EKG  EKG Interpretation None       Radiology Dg Ankle Complete Left  Result Date: 05/09/2017 CLINICAL DATA:  Fall. EXAM: LEFT ANKLE COMPLETE - 3+ VIEW COMPARISON:  No recent. FINDINGS: Soft tissue swelling. No evidence of fracture or dislocation. No acute bony abnormality identified. Peripheral vascular calcification . IMPRESSION: 1. Soft tissue swelling. No acute bony abnormality. No evidence fracture dislocation. 2. Peripheral vascular disease. Electronically Signed   By: Marcello Moores  Register   On: 05/09/2017 11:35    Procedures Procedures (including critical care time)  Medications Ordered in ED Medications - No data to display   Initial Impression / Assessment and Plan / ED Course  I have reviewed the triage vital signs and the nursing notes.  Pertinent labs & imaging results that were available during my care of the patient were reviewed by me and considered in my medical decision making (see chart for details).     Priscilla Anderson is a 83 y.o. female past medical history significant for hypertension, hyperlipidemia, CHF with AICD, CAD, diabetes, and CK D presents with left ankle pain and swelling from a fall. Patient says that last week, she tripped and fell twisting her left ankle. She had several days of pain that improved with rest ice and elevation. She said that her mother-in-law died several days ago and at the funeral, she wore heels. She says that this caused her to have worsened pain in the left ankle and swelling developed over the last few days. She says that she is able to limp around on it but it has  worsened pain. She presents for evaluation after her PCP said they could not work her in today.  She denies any other injuries and says her pain is minimal.  On exam, patient has normal pulses and normal strength in the foot and ankle. Patient has normal sensation bilaterally. Patient has no ulcers or skin injuries present. There is soft tissue swelling on the  left ankle and mild tenderness in the lateral left ankle. No tenderness in the rest of the leg. Lungs clear. Chest nontender. Abdomen nontender. No evidence of head trauma.  X-rays were obtained of the left ankle. No evidence of fracture or dislocation was seen there was soft tissue swelling. Suspect ankle sprain reaggravated by high heels.  Patient given instructions on rice therapy as well as Ace wrap placed on her ankle. Patient reports that she does not want crutches and was to follow-up with her PCP for further management. Patient advised to use ice and elevated as she has. Duration reassured that they do not see fracture.   Patient will be discharged for outpatient workup and understood return precautions. Patient discharged in good condition.    Final Clinical Impressions(s) / ED Diagnoses   Final diagnoses:  Sprain of anterior talofibular ligament of left ankle, initial encounter  Acute left ankle pain  Fall, initial encounter    New Prescriptions Discharge Medication List as of 05/09/2017 12:29 PM      Clinical Impression: 1. Sprain of anterior talofibular ligament of left ankle, initial encounter   2. Acute left ankle pain   3. Fall, initial encounter     Disposition: Discharge  Condition: Good  I have discussed the results, Dx and Tx plan with the pt(& family if present). He/she/they expressed understanding and agree(s) with the plan. Discharge instructions discussed at great length. Strict return precautions discussed and pt &/or family have verbalized understanding of the instructions. No further questions at  time of discharge.    Discharge Medication List as of 05/09/2017 12:29 PM      Follow Up: Biagio Borg, MD [place] [id number] [phone]  Schedule an appointment as soon as possible for a visit    [facility] DEPT [place] [id number] [place] [phone]  If symptoms worsen     Aamya Orellana, Gwenyth Allegra, MD 05/09/17 [phone]

## 2017-05-11 ENCOUNTER — Ambulatory Visit (INDEPENDENT_AMBULATORY_CARE_PROVIDER_SITE_OTHER): Payer: Medicare Other | Admitting: Internal Medicine

## 2017-05-11 ENCOUNTER — Encounter: Payer: Self-pay | Admitting: Internal Medicine

## 2017-05-11 VITALS — BP 110/64 | HR 77 | Ht 64.0 in | Wt 113.0 lb

## 2017-05-11 DIAGNOSIS — N183 Chronic kidney disease, stage 3 unspecified: Secondary | ICD-10-CM

## 2017-05-11 DIAGNOSIS — I10 Essential (primary) hypertension: Secondary | ICD-10-CM | POA: Diagnosis not present

## 2017-05-11 DIAGNOSIS — E7800 Pure hypercholesterolemia, unspecified: Secondary | ICD-10-CM

## 2017-05-11 DIAGNOSIS — M25572 Pain in left ankle and joints of left foot: Secondary | ICD-10-CM | POA: Diagnosis not present

## 2017-05-11 DIAGNOSIS — E1122 Type 2 diabetes mellitus with diabetic chronic kidney disease: Secondary | ICD-10-CM | POA: Diagnosis not present

## 2017-05-11 NOTE — Patient Instructions (Signed)
Ok to take off the wrap af home today  Please continue all other medications as before, and refills have been done if requested.  Please have the pharmacy call with any other refills you may need.  Please continue your efforts at being more active, low cholesterol diet, and weight control.  Please keep your appointments with your specialists as you may have planned  Please return in 3 months, or sooner if needed, with Lab testing done 3-5 days before

## 2017-05-11 NOTE — Progress Notes (Signed)
Subjective:    Patient ID: Priscilla Anderson, female    DOB: Apr 29, 1934, 83 y.o.   MRN: [id number]  HPI  Here to f/u left ankle pain, began after fall June 17 at home doing exercise, to pt has not seemed to improve with pain and swelling, seen at ED 6/26, film negative.  Has since indeed improved and today denies pain after ankle wrapped in ACE, ICE and elevation.  Admits she may have exac the pain during the recovery prior to the ED when she wore shoes with higher heels to a funeral.  Pt denies chest pain, increased sob or doe, wheezing, orthopnea, PND, increased LE swelling, palpitations, dizziness or syncope.  Pt denies new neurological symptoms such as new headache, or facial or extremity weakness or numbness   Pt denies polydipsia, polyuria Past Medical History:  Diagnosis Date  . Adenomatous colon polyp 2004  . AICD (automatic cardioverter/defibrillator) present   . CAD (coronary artery disease)   . CHF (congestive heart failure) ([place])   . Hyperlipidemia   . Hypertension   . Nonischemic cardiomyopathy ([place]) 09/22/2011  . Osteopenia   . Polyp of larynx   . Type II diabetes mellitus ([place])    Past Surgical History:  Procedure Laterality Date  . BIV ICD GENERTAOR CHANGE OUT N/A 07/29/2014   Procedure: BIV ICD GENERTAOR CHANGE OUT;  Surgeon: Sanda Klein, MD;  Location: [facility] CATH LAB;  Service: Cardiovascular;  Laterality: N/A;  . BREAST CYST EXCISION Left 1980  . CARDIAC CATHETERIZATION  07/28/2005   noncritical CAD  . CARDIAC CATHETERIZATION     "I've had several"  . CARDIAC DEFIBRILLATOR PLACEMENT  05/21/2002   s/p re-do pacemaker/defibrillator October 2011- [place].  . COLONOSCOPY    . IMPLANTABLE CARDIOVERTER DEFIBRILLATOR GENERATOR CHANGE  09/01/2010   [employer]tronic  . MICROLARYNGOSCOPY WITH CO2 LASER AND EXCISION OF VOCAL CORD LESION  08/2002   Archie Endo 03/29/2011  . VAGINAL HYSTERECTOMY      reports that she has never smoked. She has never used smokeless tobacco. She  reports that she uses drugs about 2 times per week. She reports that she does not drink alcohol. family history includes Arthritis in her father and mother; Diabetes in her other; Heart disease in her brother; Prostate cancer in her brother. Allergies  Allergen Reactions  . Lanoxin [Digoxin] Nausea Only    Nausea and wgt loss   Current Outpatient Prescriptions on File Prior to Visit  Medication Sig Dispense Refill  . acetaminophen (TYLENOL) 500 MG tablet Take 1,000 mg by mouth every 6 (six) hours as needed for moderate pain.    . Artificial Tear Solution (SOOTHE XP) SOLN Apply 1 drop to eye daily as needed (dry eyes).    . Ascorbic Acid (VITAMIN C) 500 MG tablet Take 500 mg by mouth daily.      Marland Kitchen aspirin 81 MG tablet Take 81 mg by mouth daily.      . carvedilol (COREG) 12.5 MG tablet TAKE 1 TABLET TWICE A DAY 180 tablet 1  . CORLANOR 5 MG TABS tablet TAKE 1 TABLET TWICE A DAY WITH MEALS 180 tablet 1  . diazepam (VALIUM) 5 MG tablet Take 1 tablet (5 mg total) by mouth at bedtime as needed for anxiety. 90 tablet 1  . furosemide (LASIX) 40 MG tablet Take 1.5 tablets (60 mg total) by mouth daily. 135 tablet 3  . glucose blood (ACCU-CHEK AVIVA) test strip Test two times daily. Code 250.02 100 each 11  . ibandronate (  BONIVA) 150 MG tablet TAKE 1 TABLET ONCE A MONTH. 9 tablet 3  . JANUVIA 100 MG tablet TAKE 1 TABLET DAILY 90 tablet 1  . Lancets MISC Test as directed two times daily. Code 250.02 100 each 11  . losartan (COZAAR) 50 MG tablet TAKE 1 TABLET DAILY 90 tablet 2  . metFORMIN (GLUCOPHAGE-XR) 500 MG 24 hr tablet Take 500 mg by mouth daily.    . pantoprazole (PROTONIX) 40 MG tablet Take 1 tablet (40 mg total) by mouth 2 (two) times daily. 180 tablet 3  . saccharomyces boulardii (FLORASTOR) 250 MG capsule Take 1 capsule (250 mg total) by mouth 2 (two) times daily. 60 capsule 1  . Simethicone (GAS RELIEF) 180 MG CAPS Take 180 mg by mouth 2 (two) times daily.    . simvastatin (ZOCOR) 20 MG  tablet TAKE 1 TABLET AT BEDTIME 90 tablet 1  . spironolactone (ALDACTONE) 25 MG tablet Take 1 tablet (25 mg total) by mouth daily. 90 tablet 2   No current facility-administered medications on file prior to visit.    Review of Systems  Constitutional: Negative for other unusual diaphoresis or sweats HENT: Negative for ear discharge or swelling Eyes: Negative for other worsening visual disturbances Respiratory: Negative for stridor or other swelling  Gastrointestinal: Negative for worsening distension or other blood Genitourinary: Negative for retention or other urinary change Musculoskeletal: Negative for other MSK pain or swelling Skin: Negative for color change or other new lesions Neurological: Negative for worsening tremors and other numbness  Psychiatric/Behavioral: Negative for worsening agitation or other fatigue All other system neg per pt    Objective:   Physical Exam BP 110/64   Pulse 77   Ht 5\' 4"  (1.626 m)   Wt 113 lb (51.3 kg)   SpO2 99%   BMI 19.40 kg/m  VS noted,  Constitutional: Pt appears in NAD HENT: Head: NCAT.  Right Ear: External ear normal.  Left Ear: External ear normal.  Eyes: . Pupils are equal, round, and reactive to light. Conjunctivae and EOM are normal Nose: without d/c or deformity Neck: Neck supple. Gross normal ROM Cardiovascular: Normal rate and regular rhythm.   Pulmonary/Chest: Effort normal and breath sounds without rales or wheezing.  Abd:  Soft, NT, ND, + BS, no organomegaly Neurological: Pt is alert. At baseline orientation, motor grossly intact Skin: Skin is warm. No rashes, other new lesions, no LE edema Psychiatric: Pt behavior is normal without agitation ] No other exam findings Lab Results  Component Value Date   WBC 6.4 08/10/2016   HGB 13.2 08/10/2016   HCT 39.7 08/10/2016   PLT 186.0 08/10/2016   GLUCOSE 159 (H) 02/07/2017   CHOL 128 02/07/2017   TRIG 67.0 02/07/2017   HDL 50.70 02/07/2017   LDLCALC 64 02/07/2017    ALT 8 02/07/2017   AST 14 02/07/2017   NA 137 02/07/2017   K 4.1 02/07/2017   CL 101 02/07/2017   CREATININE 2.05 (H) 02/07/2017   BUN 33 (H) 02/07/2017   CO2 28 02/07/2017   TSH 1.52 07/09/2015   INR 1.02 07/22/2014   HGBA1C 6.8 (H) 02/07/2017   MICROALBUR <0.7 03/05/2015       Assessment & Plan:

## 2017-05-14 ENCOUNTER — Other Ambulatory Visit: Payer: Self-pay | Admitting: Cardiovascular Disease

## 2017-05-14 NOTE — Assessment & Plan Note (Signed)
stable overall by history and exam, recent data reviewed with pt, and pt to continue medical treatment as before,  to f/u any worsening symptoms or concerns Lab Results  Component Value Date   HGBA1C 6.8 (H) 02/07/2017

## 2017-05-14 NOTE — Assessment & Plan Note (Signed)
stable overall by history and exam, recent data reviewed with pt, and pt to continue medical treatment as before,  to f/u any worsening symptoms or concerns BP Readings from Last 3 Encounters:  05/11/17 110/64  05/09/17 99/68  05/05/17 (!) 96/54

## 2017-05-14 NOTE — Assessment & Plan Note (Signed)
Lab Results  Component Value Date   LDLCALC 64 02/07/2017  stable overall by history and exam, recent data reviewed with pt, and pt to continue medical treatment as before,  to f/u any worsening symptoms or concerns

## 2017-05-14 NOTE — Assessment & Plan Note (Signed)
Fortunately improved, exam benign, ok to release ACE today and gradually increased activity and ambulation

## 2017-05-18 ENCOUNTER — Other Ambulatory Visit: Payer: Self-pay | Admitting: Cardiology

## 2017-05-22 DIAGNOSIS — H25813 Combined forms of age-related cataract, bilateral: Secondary | ICD-10-CM | POA: Diagnosis not present

## 2017-05-22 DIAGNOSIS — H40013 Open angle with borderline findings, low risk, bilateral: Secondary | ICD-10-CM | POA: Diagnosis not present

## 2017-05-23 ENCOUNTER — Ambulatory Visit (INDEPENDENT_AMBULATORY_CARE_PROVIDER_SITE_OTHER): Payer: Medicare Other

## 2017-05-23 ENCOUNTER — Telehealth: Payer: Self-pay

## 2017-05-23 DIAGNOSIS — I5022 Chronic systolic (congestive) heart failure: Secondary | ICD-10-CM

## 2017-05-23 DIAGNOSIS — Z95810 Presence of automatic (implantable) cardiac defibrillator: Secondary | ICD-10-CM | POA: Diagnosis not present

## 2017-05-23 NOTE — Progress Notes (Signed)
EPIC Encounter for ICM Monitoring  Patient Name: Priscilla Anderson is a 83 y.o. female Date: 05/23/2017 Primary Care Physican: Biagio Borg, MD Primary Cardiologist: Aundra Dubin Electrophysiologist: Croitoru Dry Weight:Previous ICM 114 lbs Bi-V Pacing: 96.1%      Attempted call to patient and unable to reach.  Left detailed message regarding transmission.  Transmission reviewed.    Thoracic impedance normal.  Prescribed dosage: Furosemide 40 mg 1.5 tablets (60 mg total) daily  Labs: 02/07/2017 Creatinine 2.05, BUN 33, Potassium 4.1, Sodium 137 10/31/2016 Creatinine 1.96, BUN 39, Potassium 4.2, Sodium 142  Recommendations: Left voice mail with ICM number and encouraged to call for fluid symptoms.  Follow-up plan: ICM clinic phone appointment on 06/23/2017.  Copy of ICM check sent to device physician.   3 month ICM trend: 05/23/2017   1 Year ICM trend:      Rosalene Billings, RN 05/23/2017 7:51 AM

## 2017-05-23 NOTE — Telephone Encounter (Signed)
Remote ICM transmission received.  Attempted patient call and left detailed message regarding transmission and next ICM scheduled for 06/23/2017.  Advised to return call for any fluid symptoms or questions.    

## 2017-05-30 ENCOUNTER — Other Ambulatory Visit (HOSPITAL_COMMUNITY): Payer: Self-pay

## 2017-05-30 MED ORDER — IVABRADINE HCL 5 MG PO TABS: 5.0000 mg | ORAL_TABLET | Freq: Two times a day (BID) | ORAL | 1 refills | Status: DC

## 2017-06-23 ENCOUNTER — Ambulatory Visit (INDEPENDENT_AMBULATORY_CARE_PROVIDER_SITE_OTHER): Payer: Medicare Other

## 2017-06-23 DIAGNOSIS — I5022 Chronic systolic (congestive) heart failure: Secondary | ICD-10-CM | POA: Diagnosis not present

## 2017-06-23 DIAGNOSIS — Z95810 Presence of automatic (implantable) cardiac defibrillator: Secondary | ICD-10-CM

## 2017-06-23 NOTE — Progress Notes (Signed)
EPIC Encounter for ICM Monitoring  Patient Name: Priscilla Anderson is a 83 y.o. female Date: 06/23/2017 Primary Care Physican: Biagio Borg, MD Primary Cardiologist: Aundra Dubin Electrophysiologist: Croitoru Dry Weight: 114lbs Bi-V Pacing: 96.2%          Heart Failure questions reviewed, pt asymptomatic.   Thoracic impedance normal.  Prescribed dosage: Furosemide 40 mg 1.5 tablets (60 mg total) daily  Labs: 02/07/2017 Creatinine 2.05, BUN 33, Potassium 4.1, Sodium 137 10/31/2016 Creatinine 1.96, BUN 39, Potassium 4.2, Sodium 142  Recommendations: No changes.    Encouraged to call for fluid symptoms.  Follow-up plan: ICM clinic phone appointment on 07/24/2017.   Copy of ICM check sent to device physician.   3 month ICM trend: 06/23/2017   1 Year ICM trend:      Rosalene Billings, RN 06/23/2017 1:31 PM

## 2017-06-23 NOTE — Progress Notes (Signed)
Thank you MCr 

## 2017-06-24 ENCOUNTER — Other Ambulatory Visit: Payer: Self-pay | Admitting: Cardiovascular Disease

## 2017-07-23 ENCOUNTER — Other Ambulatory Visit: Payer: Self-pay | Admitting: Internal Medicine

## 2017-07-24 ENCOUNTER — Ambulatory Visit (INDEPENDENT_AMBULATORY_CARE_PROVIDER_SITE_OTHER): Payer: Medicare Other | Admitting: *Deleted

## 2017-07-24 DIAGNOSIS — Z95810 Presence of automatic (implantable) cardiac defibrillator: Secondary | ICD-10-CM

## 2017-07-24 DIAGNOSIS — I420 Dilated cardiomyopathy: Secondary | ICD-10-CM

## 2017-07-24 DIAGNOSIS — I5022 Chronic systolic (congestive) heart failure: Secondary | ICD-10-CM | POA: Diagnosis not present

## 2017-07-24 NOTE — Progress Notes (Signed)
Remote ICD transmission.   

## 2017-07-24 NOTE — Progress Notes (Signed)
Thanks MCr 

## 2017-07-24 NOTE — Progress Notes (Signed)
EPIC Encounter for ICM Monitoring  Patient Name: Priscilla Anderson is a 83 y.o. female Date: 07/24/2017 Primary Care Physican: Biagio Borg, MD Primary Cardiologist: Aundra Dubin Electrophysiologist: Croitoru Dry Weight: 113lbs Bi-V Pacing: 96%      Heart Failure questions reviewed, pt asymptomatic.   Thoracic impedance normal.  Prescribed dosage: Furosemide 40 mg 1.5 tablets (60 mg total) daily  Labs: 02/07/2017 Creatinine 2.05, BUN 33, Potassium 4.1, Sodium 137 10/31/2016 Creatinine 1.96, BUN 39, Potassium 4.2, Sodium 142  Recommendations: No changes.   Encouraged to call for fluid symptoms.  Follow-up plan: ICM clinic phone appointment on 08/29/2017.    Copy of ICM check sent to Dr. Sallyanne Kuster.   3 month ICM trend: 07/24/2017   1 Year ICM trend:      Rosalene Billings, RN 07/24/2017 4:38 PM

## 2017-07-25 LAB — CUP PACEART REMOTE DEVICE CHECK
Battery Remaining Longevity: 17 mo
Brady Statistic AP VS Percent: 0.67 %
Brady Statistic AS VP Percent: 31.63 %
Brady Statistic AS VS Percent: 1.53 %
Brady Statistic RA Percent Paced: 65.03 %
Brady Statistic RV Percent Paced: 95.99 %
Date Time Interrogation Session: 20180910041602
HighPow Impedance: 41 Ohm
HighPow Impedance: 59 Ohm
Implantable Lead Implant Date: 20111019
Implantable Lead Location: 753858
Implantable Lead Location: 753860
Implantable Lead Model: 5071
Implantable Lead Model: 5076
Implantable Lead Model: 5076
Implantable Lead Model: 6949
Lead Channel Impedance Value: 304 Ohm
Lead Channel Impedance Value: 361 Ohm
Lead Channel Impedance Value: 4047 Ohm
Lead Channel Impedance Value: 456 Ohm
Lead Channel Pacing Threshold Amplitude: 1.375 V
Lead Channel Pacing Threshold Amplitude: 1.625 V
Lead Channel Sensing Intrinsic Amplitude: 17.625 mV
Lead Channel Sensing Intrinsic Amplitude: 17.625 mV
Lead Channel Setting Pacing Amplitude: 2.5 V
Lead Channel Setting Pacing Amplitude: 3.25 V
Lead Channel Setting Pacing Pulse Width: 0.4 ms
Lead Channel Setting Pacing Pulse Width: 0.4 ms
Lead Channel Setting Sensing Sensitivity: 0.3 mV
MDC IDC LEAD IMPLANT DT: 20030708
MDC IDC LEAD IMPLANT DT: 20070501
MDC IDC LEAD IMPLANT DT: 20070503
MDC IDC LEAD LOCATION: 753859
MDC IDC LEAD LOCATION: 753860
MDC IDC MSMT BATTERY VOLTAGE: 2.92 V
MDC IDC MSMT LEADCHNL LV IMPEDANCE VALUE: 4047 Ohm
MDC IDC MSMT LEADCHNL LV PACING THRESHOLD PULSEWIDTH: 0.4 ms
MDC IDC MSMT LEADCHNL RA IMPEDANCE VALUE: 418 Ohm
MDC IDC MSMT LEADCHNL RA PACING THRESHOLD AMPLITUDE: 0.625 V
MDC IDC MSMT LEADCHNL RA PACING THRESHOLD PULSEWIDTH: 0.4 ms
MDC IDC MSMT LEADCHNL RA SENSING INTR AMPL: 3.25 mV
MDC IDC MSMT LEADCHNL RA SENSING INTR AMPL: 3.25 mV
MDC IDC MSMT LEADCHNL RV PACING THRESHOLD PULSEWIDTH: 0.4 ms
MDC IDC PG IMPLANT DT: 20150915
MDC IDC SET LEADCHNL RA PACING AMPLITUDE: 2 V
MDC IDC STAT BRADY AP VP PERCENT: 66.18 %

## 2017-07-26 ENCOUNTER — Telehealth (HOSPITAL_COMMUNITY): Payer: Self-pay | Admitting: Pharmacist

## 2017-07-26 ENCOUNTER — Encounter: Payer: Self-pay | Admitting: Cardiology

## 2017-07-26 NOTE — Telephone Encounter (Signed)
Corlanor PA approved by [employer] through 07/26/18.   Ruta Hinds. Velva Harman, PharmD, BCPS, CPP Clinical Pharmacist Pager: [phone] Phone: [phone] 07/26/2017 9:52 AM

## 2017-08-11 ENCOUNTER — Ambulatory Visit (INDEPENDENT_AMBULATORY_CARE_PROVIDER_SITE_OTHER): Payer: Medicare Other | Admitting: Internal Medicine

## 2017-08-11 ENCOUNTER — Other Ambulatory Visit: Payer: Self-pay | Admitting: Internal Medicine

## 2017-08-11 ENCOUNTER — Other Ambulatory Visit (INDEPENDENT_AMBULATORY_CARE_PROVIDER_SITE_OTHER): Payer: Medicare Other

## 2017-08-11 ENCOUNTER — Encounter: Payer: Self-pay | Admitting: Internal Medicine

## 2017-08-11 VITALS — BP 110/78 | HR 74 | Temp 97.9°F | Ht 64.0 in | Wt 115.0 lb

## 2017-08-11 DIAGNOSIS — I10 Essential (primary) hypertension: Secondary | ICD-10-CM | POA: Diagnosis not present

## 2017-08-11 DIAGNOSIS — N183 Chronic kidney disease, stage 3 unspecified: Secondary | ICD-10-CM

## 2017-08-11 DIAGNOSIS — E7800 Pure hypercholesterolemia, unspecified: Secondary | ICD-10-CM | POA: Diagnosis not present

## 2017-08-11 DIAGNOSIS — E1122 Type 2 diabetes mellitus with diabetic chronic kidney disease: Secondary | ICD-10-CM

## 2017-08-11 DIAGNOSIS — Z23 Encounter for immunization: Secondary | ICD-10-CM

## 2017-08-11 LAB — CBC WITH DIFFERENTIAL/PLATELET
Basophils Absolute: 0 10*3/uL (ref 0.0–0.1)
Basophils Relative: 0 % (ref 0.0–3.0)
EOS PCT: 2.3 % (ref 0.0–5.0)
Eosinophils Absolute: 0.2 10*3/uL (ref 0.0–0.7)
HCT: 37.7 % (ref 36.0–46.0)
Hemoglobin: 12.3 g/dL (ref 12.0–15.0)
LYMPHS ABS: 1.3 10*3/uL (ref 0.7–4.0)
Lymphocytes Relative: 16.3 % (ref 12.0–46.0)
MCHC: 32.6 g/dL (ref 30.0–36.0)
MCV: 100.6 fl — ABNORMAL HIGH (ref 78.0–100.0)
MONO ABS: 0.8 10*3/uL (ref 0.1–1.0)
Monocytes Relative: 10.7 % (ref 3.0–12.0)
NEUTROS PCT: 70.7 % (ref 43.0–77.0)
Neutro Abs: 5.5 10*3/uL (ref 1.4–7.7)
PLATELETS: 202 10*3/uL (ref 150.0–400.0)
RBC: 3.74 Mil/uL — AB (ref 3.87–5.11)
RDW: 13.3 % (ref 11.5–15.5)
WBC: 7.8 10*3/uL (ref 4.0–10.5)

## 2017-08-11 LAB — HEPATIC FUNCTION PANEL
ALBUMIN: 4.7 g/dL (ref 3.5–5.2)
ALK PHOS: 49 U/L (ref 39–117)
ALT: 8 U/L (ref 0–35)
AST: 16 U/L (ref 0–37)
Bilirubin, Direct: 0 mg/dL (ref 0.0–0.3)
TOTAL PROTEIN: 7.4 g/dL (ref 6.0–8.3)
Total Bilirubin: 0.4 mg/dL (ref 0.2–1.2)

## 2017-08-11 LAB — HEMOGLOBIN A1C: HEMOGLOBIN A1C: 6.6 % — AB (ref 4.6–6.5)

## 2017-08-11 LAB — BASIC METABOLIC PANEL
BUN: 52 mg/dL — ABNORMAL HIGH (ref 6–23)
CO2: 29 meq/L (ref 19–32)
Calcium: 10.3 mg/dL (ref 8.4–10.5)
Chloride: 96 mEq/L (ref 96–112)
Creatinine, Ser: 2.32 mg/dL — ABNORMAL HIGH (ref 0.40–1.20)
GFR: 25.88 mL/min — AB (ref >60.00)
GLUCOSE: 113 mg/dL — AB (ref 70–99)
POTASSIUM: 4.9 meq/L (ref 3.5–5.1)
SODIUM: 135 meq/L (ref 135–145)

## 2017-08-11 LAB — LIPID PANEL
Cholesterol: 158 mg/dL (ref 0–200)
HDL: 53.4 mg/dL (ref >39.00)
LDL Cholesterol: 82 mg/dL (ref 0–99)
NonHDL: 104.49
Total CHOL/HDL Ratio: 3
Triglycerides: 111 mg/dL (ref 0.0–149.0)
VLDL: 22.2 mg/dL (ref 0.0–40.0)

## 2017-08-11 LAB — TSH: TSH: 1.78 u[IU]/mL (ref 0.35–4.50)

## 2017-08-11 MED ORDER — DIAZEPAM 5 MG PO TABS: 5.0000 mg | ORAL_TABLET | Freq: Every evening | ORAL | 1 refills | Status: DC | PRN

## 2017-08-11 MED ORDER — METFORMIN HCL ER 500 MG PO TB24: 500.0000 mg | ORAL_TABLET | Freq: Every day | ORAL | 3 refills | Status: DC

## 2017-08-11 MED ORDER — ZOSTER VAC RECOMB ADJUVANTED 50 MCG/0.5ML IM SUSR: 0.5000 mL | Freq: Once | INTRAMUSCULAR | 1 refills | Status: AC

## 2017-08-11 NOTE — Assessment & Plan Note (Signed)
Moderate, for f/u lab, consider nephrology referral for any worsening

## 2017-08-11 NOTE — Assessment & Plan Note (Signed)
stable overall by history and exam, recent data reviewed with pt, and pt to continue medical treatment as before,  to f/u any worsening symptoms or concerns BP Readings from Last 3 Encounters:  08/11/17 110/78  05/11/17 110/64  05/09/17 99/68

## 2017-08-11 NOTE — Assessment & Plan Note (Signed)
stable overall by history and exam, recent data reviewed with pt, and pt to continue medical treatment as before,  to f/u any worsening symptoms or concerns Lab Results  Component Value Date   LDLCALC 82 08/11/2017

## 2017-08-11 NOTE — Assessment & Plan Note (Signed)
Lab Results  Component Value Date   HGBA1C 6.6 (H) 08/11/2017  stable overall by history and exam, recent data reviewed with pt, and pt to continue medical treatment as before,  to f/u any worsening symptoms or concerns

## 2017-08-11 NOTE — Progress Notes (Signed)
Subjective:    Patient ID: Priscilla Anderson, female    DOB: 1934-01-06, 83 y.o.   MRN: [id number]  HPI  Here to f/u; overall doing ok,  Pt denies chest pain, increasing sob or doe, wheezing, orthopnea, PND, increased LE swelling, palpitations, dizziness or syncope.  Pt denies new neurological symptoms such as new headache, or facial or extremity weakness or numbness.  Pt denies polydipsia, polyuria, or low sugar episode.  Pt states overall good compliance with meds, mostly trying to follow appropriate diet, with wt overall stable to up a few lbs.   Wt Readings from Last 3 Encounters:  08/11/17 115 lb (52.2 kg)  05/11/17 113 lb (51.3 kg)  05/05/17 112 lb (50.8 kg)  Denies worsening reflux, dysphagia, n/v, bowel change or blood, but does take gas-x up to three times per day prn for gas pains.  Still getting 2 metformin per ay instead of 1 per day from the [facility], needs a hardcopy rx so she cansend herself and correct.   Pt denies fever, wt loss, night sweats, loss of appetite, or other constitutional symptoms Denies worsening depressive symptoms, suicidal ideation, or panic; has ongoing anxiety, asks for valium refill. Due for shingrix Past Medical History:  Diagnosis Date  . Adenomatous colon polyp 2004  . AICD (automatic cardioverter/defibrillator) present   . CAD (coronary artery disease)   . CHF (congestive heart failure) ([place])   . Hyperlipidemia   . Hypertension   . Nonischemic cardiomyopathy ([place]) 09/22/2011  . Osteopenia   . Polyp of larynx   . Type II diabetes mellitus ([place])    Past Surgical History:  Procedure Laterality Date  . BIV ICD GENERTAOR CHANGE OUT N/A 07/29/2014   Procedure: BIV ICD GENERTAOR CHANGE OUT;  Surgeon: Sanda Klein, MD;  Location: [facility];  Service: Cardiovascular;  Laterality: N/A;  . BREAST CYST EXCISION Left 1980  . CARDIAC CATHETERIZATION  07/28/2005   noncritical CAD  . CARDIAC CATHETERIZATION     "I've had several"  . CARDIAC  DEFIBRILLATOR PLACEMENT  05/21/2002   s/p re-do pacemaker/defibrillator October 2011- [facility].  . COLONOSCOPY    . IMPLANTABLE CARDIOVERTER DEFIBRILLATOR GENERATOR CHANGE  09/01/2010   Medtronic  . MICROLARYNGOSCOPY WITH CO2 LASER AND EXCISION OF VOCAL CORD LESION  08/2002   Archie Endo 03/29/2011  . VAGINAL HYSTERECTOMY      reports that she has never smoked. She has never used smokeless tobacco. She reports that she uses drugs about 2 times per week. She reports that she does not drink alcohol. family history includes Arthritis in her father and mother; Diabetes in her other; Heart disease in her brother; Prostate cancer in her brother. Allergies  Allergen Reactions  . Lanoxin [Digoxin] Nausea Only    Nausea and wgt loss   Current Outpatient Prescriptions on File Prior to Visit  Medication Sig Dispense Refill  . Artificial Tear Solution (SOOTHE XP) SOLN Apply 1 drop to eye daily as needed (dry eyes).    . Ascorbic Acid (VITAMIN C) 500 MG tablet Take 500 mg by mouth daily.      Marland Kitchen aspirin 81 MG tablet Take 81 mg by mouth daily.      . carvedilol (COREG) 12.5 MG tablet TAKE 1 TABLET TWICE A DAY 180 tablet 2  . furosemide (LASIX) 40 MG tablet Take 1.5 tablets (60 mg total) by mouth daily. 135 tablet 3  . glucose blood (ACCU-CHEK AVIVA) test strip Test two times daily. Code 250.02 100 each 11  .  ibandronate (BONIVA) 150 MG tablet TAKE 1 TABLET ONCE A MONTH. 9 tablet 3  . ivabradine (CORLANOR) 5 MG TABS tablet Take 1 tablet (5 mg total) by mouth 2 (two) times daily with a meal. 180 tablet 1  . JANUVIA 100 MG tablet TAKE 1 TABLET DAILY 90 tablet 1  . Lancets MISC Test as directed two times daily. Code 250.02 100 each 11  . losartan (COZAAR) 50 MG tablet TAKE 1 TABLET DAILY 90 tablet 2  . pantoprazole (PROTONIX) 40 MG tablet Take 1 tablet (40 mg total) by mouth 2 (two) times daily. 180 tablet 3  . saccharomyces boulardii (FLORASTOR) 250 MG capsule Take 1 capsule (250 mg total) by mouth 2  (two) times daily. 60 capsule 1  . Simethicone (GAS RELIEF) 180 MG CAPS Take 180 mg by mouth 2 (two) times daily. Takes 2 after meals and takes 2 at bedtime    . simvastatin (ZOCOR) 20 MG tablet TAKE 1 TABLET AT BEDTIME 90 tablet 1  . spironolactone (ALDACTONE) 25 MG tablet Take 1 tablet (25 mg total) by mouth daily. 90 tablet 2   No current facility-administered medications on file prior to visit.    Review of Systems  Constitutional: Negative for other unusual diaphoresis or sweats HENT: Negative for ear discharge or swelling Eyes: Negative for other worsening visual disturbances Respiratory: Negative for stridor or other swelling  Gastrointestinal: Negative for worsening distension or other blood Genitourinary: Negative for retention or other urinary change Musculoskeletal: Negative for other MSK pain or swelling Skin: Negative for color change or other new lesions Neurological: Negative for worsening tremors and other numbness  Psychiatric/Behavioral: Negative for worsening agitation or other fatigue All other system neg per pt    Objective:   Physical Exam BP 110/78   Pulse 74   Temp 97.9 F (36.6 C) (Oral)   Ht 5\' 4"  (1.626 m)   Wt 115 lb (52.2 kg)   SpO2 98%   BMI 19.74 kg/m  VS noted,  Constitutional: Pt appears in NAD HENT: Head: NCAT.  Right Ear: External ear normal.  Left Ear: External ear normal.  Eyes: . Pupils are equal, round, and reactive to light. Conjunctivae and EOM are normal Nose: without d/c or deformity Neck: Neck supple. Gross normal ROM Cardiovascular: Normal rate and regular rhythm.   Pulmonary/Chest: Effort normal and breath sounds without rales or wheezing.  Neurological: Pt is alert. At baseline orientation, motor grossly intact Skin: Skin is warm. No rashes, other new lesions, no LE edema Psychiatric: Pt behavior is normal without agitation  No other exam findings Lab Results  Component Value Date   WBC 7.8 08/11/2017   HGB 12.3 08/11/2017    HCT 37.7 08/11/2017   PLT 202.0 08/11/2017   GLUCOSE 113 (H) 08/11/2017   CHOL 158 08/11/2017   TRIG 111.0 08/11/2017   HDL 53.40 08/11/2017   LDLCALC 82 08/11/2017   ALT 8 08/11/2017   AST 16 08/11/2017   NA 135 08/11/2017   K 4.9 08/11/2017   CL 96 08/11/2017   CREATININE 2.32 (H) 08/11/2017   BUN 52 (H) 08/11/2017   CO2 29 08/11/2017   TSH 1.78 08/11/2017   INR 1.02 07/22/2014   HGBA1C 6.6 (H) 08/11/2017   MICROALBUR <0.7 03/05/2015      Assessment & Plan:

## 2017-08-11 NOTE — Patient Instructions (Addendum)
You had the flu shot today  Your shingles shot was sent to the pharmacy  Please continue all other medications as before, and refills have been done if requested, including the metformin and the valium.  Please have the pharmacy call with any other refills you may need.  Please continue your efforts at being more active, low cholesterol diet, and weight control.  Please keep your appointments with your specialists as you may have planned  Please go to the LAB in the Basement (turn left off the elevator) for the tests to be done today  We may need to have you see a kidney specialist if your kidney numbers are not improved  You will be contacted by phone if any changes need to be made immediately.  Otherwise, you will receive a letter about your results with an explanation, but please check with MyChart first.  Please remember to sign up for MyChart if you have not done so, as this will be important to you in the future with finding out test results, communicating by private email, and scheduling acute appointments online when needed.  Please return in 6 months, or sooner if needed

## 2017-08-14 ENCOUNTER — Telehealth: Payer: Self-pay

## 2017-08-14 NOTE — Telephone Encounter (Signed)
-----   Message from Biagio Borg, MD sent at 08/11/2017  4:54 PM EDT ----- Left message on MyChart, pt to cont same tx except  The test results show that your current treatment is OK, except as we suspected, the kidney function is again slightly worse.  We will need to refer you to Nephrology (medical kidney doctor) for further treatment and monitoring.Redmond Baseman to please inform pt, I will do referral

## 2017-08-14 NOTE — Telephone Encounter (Signed)
Pt has been informed and expressed understanding.  

## 2017-08-14 NOTE — Telephone Encounter (Signed)
Called pt, LVM.   

## 2017-08-29 ENCOUNTER — Ambulatory Visit (INDEPENDENT_AMBULATORY_CARE_PROVIDER_SITE_OTHER): Payer: Medicare Other

## 2017-08-29 DIAGNOSIS — Z95810 Presence of automatic (implantable) cardiac defibrillator: Secondary | ICD-10-CM

## 2017-08-29 DIAGNOSIS — I5022 Chronic systolic (congestive) heart failure: Secondary | ICD-10-CM

## 2017-08-29 NOTE — Progress Notes (Signed)
EPIC Encounter for ICM Monitoring  Patient Name: Priscilla Anderson is a 83 y.o. female Date: 08/29/2017 Primary Care Physican: Biagio Borg, MD Primary Cardiologist: Aundra Dubin Electrophysiologist: Croitoru Dry Weight:113lbs Bi-V Pacing: 96.6%       Heart Failure questions reviewed, pt asymptomatic.   Thoracic impedance normal.  Prescribed dosage: Furosemide 40 mg 1.5 tablets (60 mg total) daily  Labs: 08/11/2017 Creatinine 2.32, BUN 52, Potassium 4.9, Sodium 135, EGFR 25.88 (per result note being referred to nephrologist) 02/07/2017 Creatinine 2.05, BUN 33, Potassium 4.1, Sodium 137 10/31/2016 Creatinine 1.96, BUN 39, Potassium 4.2, Sodium 142  Recommendations: No changes.   Encouraged to call for fluid symptoms.  Follow-up plan: ICM clinic phone appointment on 10/02/2017.  Office appointment scheduled 10/25/2017 with Dr. Sallyanne Kuster.  Copy of ICM check sent to Dr. Sallyanne Kuster.   3 month ICM trend: 08/29/2017   1 Year ICM trend:      Rosalene Billings, RN 08/29/2017 8:36 AM

## 2017-08-29 NOTE — Progress Notes (Signed)
Thank you MCr 

## 2017-10-02 ENCOUNTER — Ambulatory Visit (INDEPENDENT_AMBULATORY_CARE_PROVIDER_SITE_OTHER): Payer: Medicare Other

## 2017-10-02 DIAGNOSIS — Z95810 Presence of automatic (implantable) cardiac defibrillator: Secondary | ICD-10-CM | POA: Diagnosis not present

## 2017-10-02 DIAGNOSIS — I5022 Chronic systolic (congestive) heart failure: Secondary | ICD-10-CM

## 2017-10-02 NOTE — Progress Notes (Signed)
EPIC Encounter for ICM Monitoring  Patient Name: Priscilla Anderson is a 83 y.o. female Date: 10/02/2017 Primary Care Physican: Biagio Borg, MD Primary Cardiologist: Aundra Dubin Electrophysiologist: Croitoru Dry Weight:112lbs Bi-V Pacing: 97.56%         Heart Failure questions reviewed, pt asymptomatic.  She stated she is feeling good and is going to her son's for Thanksgiving.    Thoracic impedance normal.  Prescribed dosage: Furosemide 40 mg 1.5 tablets (60 mg total) daily  Labs: 08/11/2017 Creatinine 2.32, BUN 52, Potassium 4.9, Sodium 135, EGFR 25.88 (per result note being referred to nephrologist) 02/07/2017 Creatinine 2.05, BUN 33, Potassium 4.1, Sodium 137 10/31/2016 Creatinine 1.96, BUN 39, Potassium 4.2, Sodium 142  Recommendations:  No changes.   Encouraged to call for fluid symptoms.  Follow-up plan: ICM clinic phone appointment on 11/27/2017.  Office appointment scheduled 10/25/2017 with Dr. Sallyanne Kuster.  Copy of ICM check sent to Dr. Sallyanne Kuster.   3 month ICM trend: 10/02/2017    1 Year ICM trend:       Rosalene Billings, RN 10/02/2017 10:15 AM

## 2017-10-10 ENCOUNTER — Other Ambulatory Visit: Payer: Self-pay | Admitting: Cardiovascular Disease

## 2017-10-10 ENCOUNTER — Other Ambulatory Visit: Payer: Self-pay | Admitting: Internal Medicine

## 2017-10-10 NOTE — Telephone Encounter (Signed)
REFILL 

## 2017-10-12 DIAGNOSIS — I428 Other cardiomyopathies: Secondary | ICD-10-CM | POA: Diagnosis not present

## 2017-10-12 DIAGNOSIS — E1122 Type 2 diabetes mellitus with diabetic chronic kidney disease: Secondary | ICD-10-CM | POA: Diagnosis not present

## 2017-10-12 DIAGNOSIS — I129 Hypertensive chronic kidney disease with stage 1 through stage 4 chronic kidney disease, or unspecified chronic kidney disease: Secondary | ICD-10-CM | POA: Diagnosis not present

## 2017-10-12 DIAGNOSIS — D631 Anemia in chronic kidney disease: Secondary | ICD-10-CM | POA: Diagnosis not present

## 2017-10-12 DIAGNOSIS — N2581 Secondary hyperparathyroidism of renal origin: Secondary | ICD-10-CM | POA: Diagnosis not present

## 2017-10-12 DIAGNOSIS — N184 Chronic kidney disease, stage 4 (severe): Secondary | ICD-10-CM | POA: Diagnosis not present

## 2017-10-24 ENCOUNTER — Other Ambulatory Visit: Payer: Self-pay | Admitting: Cardiovascular Disease

## 2017-10-25 ENCOUNTER — Encounter: Payer: Medicare Other | Admitting: Cardiovascular Disease

## 2017-11-27 ENCOUNTER — Ambulatory Visit (INDEPENDENT_AMBULATORY_CARE_PROVIDER_SITE_OTHER): Payer: Medicare Other | Admitting: *Deleted

## 2017-11-27 DIAGNOSIS — I5022 Chronic systolic (congestive) heart failure: Secondary | ICD-10-CM

## 2017-11-27 DIAGNOSIS — I420 Dilated cardiomyopathy: Secondary | ICD-10-CM

## 2017-11-27 DIAGNOSIS — Z95810 Presence of automatic (implantable) cardiac defibrillator: Secondary | ICD-10-CM | POA: Diagnosis not present

## 2017-11-27 NOTE — Progress Notes (Signed)
EPIC Encounter for ICM Monitoring  Patient Name: Priscilla Anderson is a 84 y.o. female Date: 11/27/2017 Primary Care Physican: Biagio Borg, MD Primary Cardiologist: Croitoru Electrophysiologist: Croitoru Dry Weight:115lbs Bi-V Pacing: 97.7%      Heart Failure questions reviewed, pt symptomatic with some congestion in her throat.  She said she stopped following low salt diet and knows the reason for fluid is due to salty snacks like potato chips.  She said she is going to cut back on the salty foods.     Thoracic impedance abnormal suggesting fluid accumulation.  Prescribed dosage: Furosemide 40 mg 1.5 tablets (60 mg total) daily  Labs: 08/11/2017 Creatinine 2.32, BUN 52, Potassium 4.9, Sodium 135, EGFR 25.88 (per result note being referred to nephrologist) 02/07/2017 Creatinine 2.05, BUN 33, Potassium 4.1, Sodium 137 10/31/2016 Creatinine 1.96, BUN 39, Potassium 4.2, Sodium 142  Recommendations: Advised to limit salty foods.   Follow-up plan: ICM clinic phone appointment on 12/07/2017 to recheck fluid levels.  Office appointment scheduled 01/03/2018 with Dr. Sallyanne Kuster.  Copy of ICM check sent to Dr. Sallyanne Kuster for review and if any recommendations will call her back.   3 month ICM trend: 11/27/2017    1 Year ICM trend:       Rosalene Billings, RN 11/27/2017 4:47 PM

## 2017-11-29 LAB — CUP PACEART REMOTE DEVICE CHECK
Battery Remaining Longevity: 17 mo
Battery Voltage: 2.92 V
Brady Statistic AP VP Percent: 54.42 %
Date Time Interrogation Session: 20190114094223
HIGH POWER IMPEDANCE MEASURED VALUE: 38 Ohm
HighPow Impedance: 53 Ohm
Implantable Lead Implant Date: 20030708
Implantable Lead Implant Date: 20070501
Implantable Lead Location: 753859
Implantable Lead Location: 753860
Implantable Lead Model: 5071
Implantable Lead Model: 5076
Lead Channel Impedance Value: 4047 Ohm
Lead Channel Impedance Value: 418 Ohm
Lead Channel Pacing Threshold Amplitude: 0.5 V
Lead Channel Pacing Threshold Pulse Width: 0.4 ms
Lead Channel Pacing Threshold Pulse Width: 0.4 ms
Lead Channel Sensing Intrinsic Amplitude: 1.75 mV
Lead Channel Sensing Intrinsic Amplitude: 16.75 mV
Lead Channel Setting Pacing Amplitude: 2 V
Lead Channel Setting Sensing Sensitivity: 0.3 mV
MDC IDC LEAD IMPLANT DT: 20070503
MDC IDC LEAD IMPLANT DT: 20111019
MDC IDC LEAD LOCATION: 753858
MDC IDC LEAD LOCATION: 753860
MDC IDC MSMT LEADCHNL LV IMPEDANCE VALUE: 304 Ohm
MDC IDC MSMT LEADCHNL LV IMPEDANCE VALUE: 4047 Ohm
MDC IDC MSMT LEADCHNL LV PACING THRESHOLD AMPLITUDE: 1.375 V
MDC IDC MSMT LEADCHNL RA PACING THRESHOLD PULSEWIDTH: 0.4 ms
MDC IDC MSMT LEADCHNL RA SENSING INTR AMPL: 1.75 mV
MDC IDC MSMT LEADCHNL RV IMPEDANCE VALUE: 304 Ohm
MDC IDC MSMT LEADCHNL RV IMPEDANCE VALUE: 418 Ohm
MDC IDC MSMT LEADCHNL RV PACING THRESHOLD AMPLITUDE: 1.5 V
MDC IDC MSMT LEADCHNL RV SENSING INTR AMPL: 16.75 mV
MDC IDC PG IMPLANT DT: 20150915
MDC IDC SET LEADCHNL LV PACING AMPLITUDE: 2.5 V
MDC IDC SET LEADCHNL LV PACING PULSEWIDTH: 0.4 ms
MDC IDC SET LEADCHNL RV PACING AMPLITUDE: 3 V
MDC IDC SET LEADCHNL RV PACING PULSEWIDTH: 0.4 ms
MDC IDC STAT BRADY AP VS PERCENT: 0.16 %
MDC IDC STAT BRADY AS VP PERCENT: 44.49 %
MDC IDC STAT BRADY AS VS PERCENT: 0.94 %
MDC IDC STAT BRADY RA PERCENT PACED: 53.61 %
MDC IDC STAT BRADY RV PERCENT PACED: 97.73 %

## 2017-11-29 NOTE — Progress Notes (Signed)
Remote ICD transmission.   

## 2017-12-01 ENCOUNTER — Encounter: Payer: Self-pay | Admitting: Cardiology

## 2017-12-01 IMAGING — CR DG ANKLE COMPLETE 3+V*L*
3 series · 3 of 3 positions shown · non-contrast
Comparison: No recent.

CLINICAL DATA: Fall.

EXAM:
LEFT ANKLE COMPLETE - 3+ VIEW

[x ankle ap left]
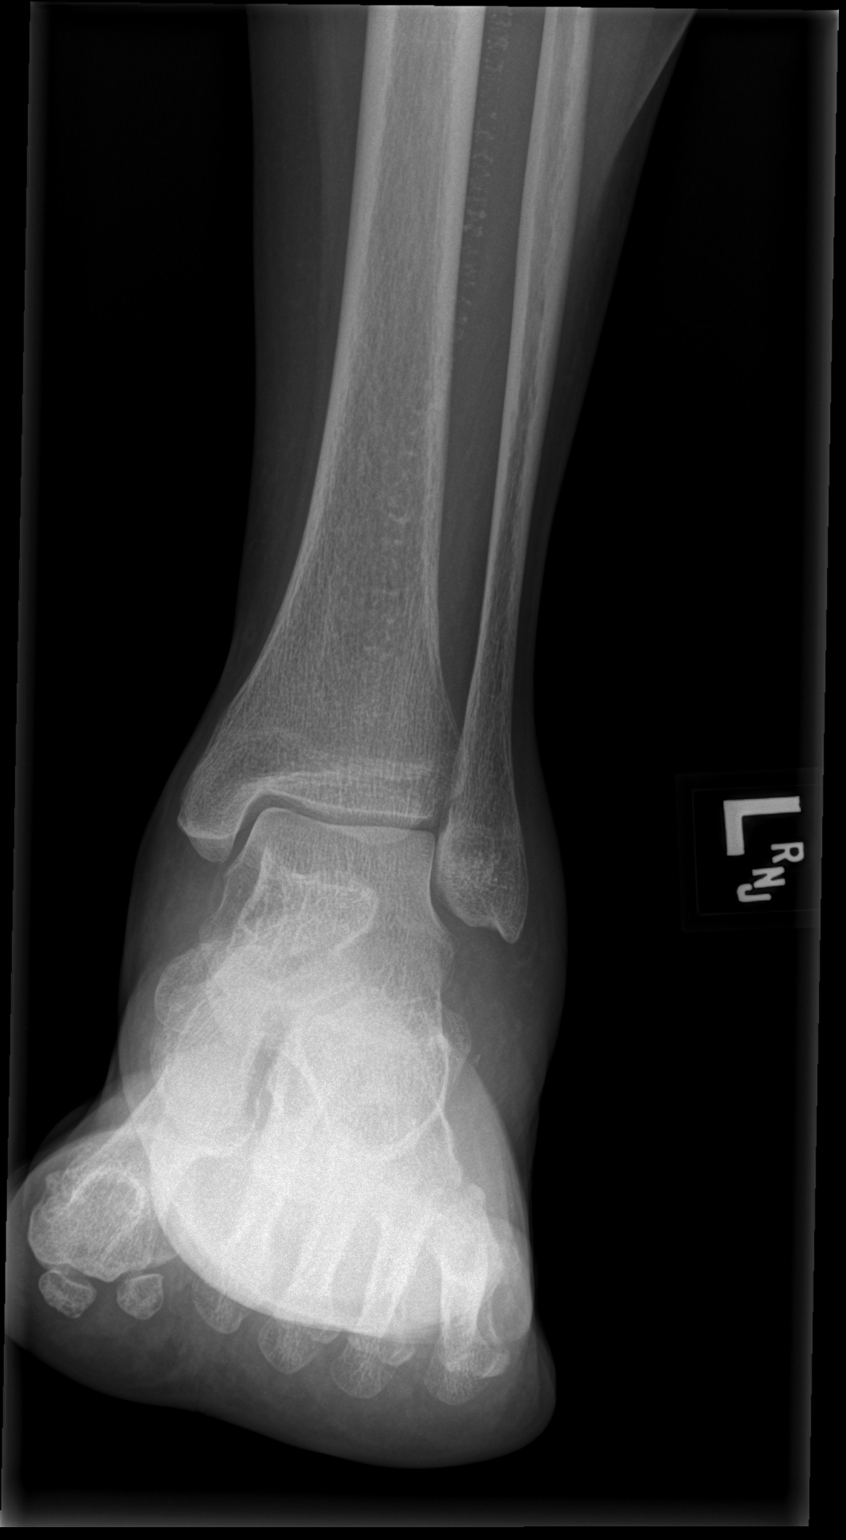

[x ankle obl left]
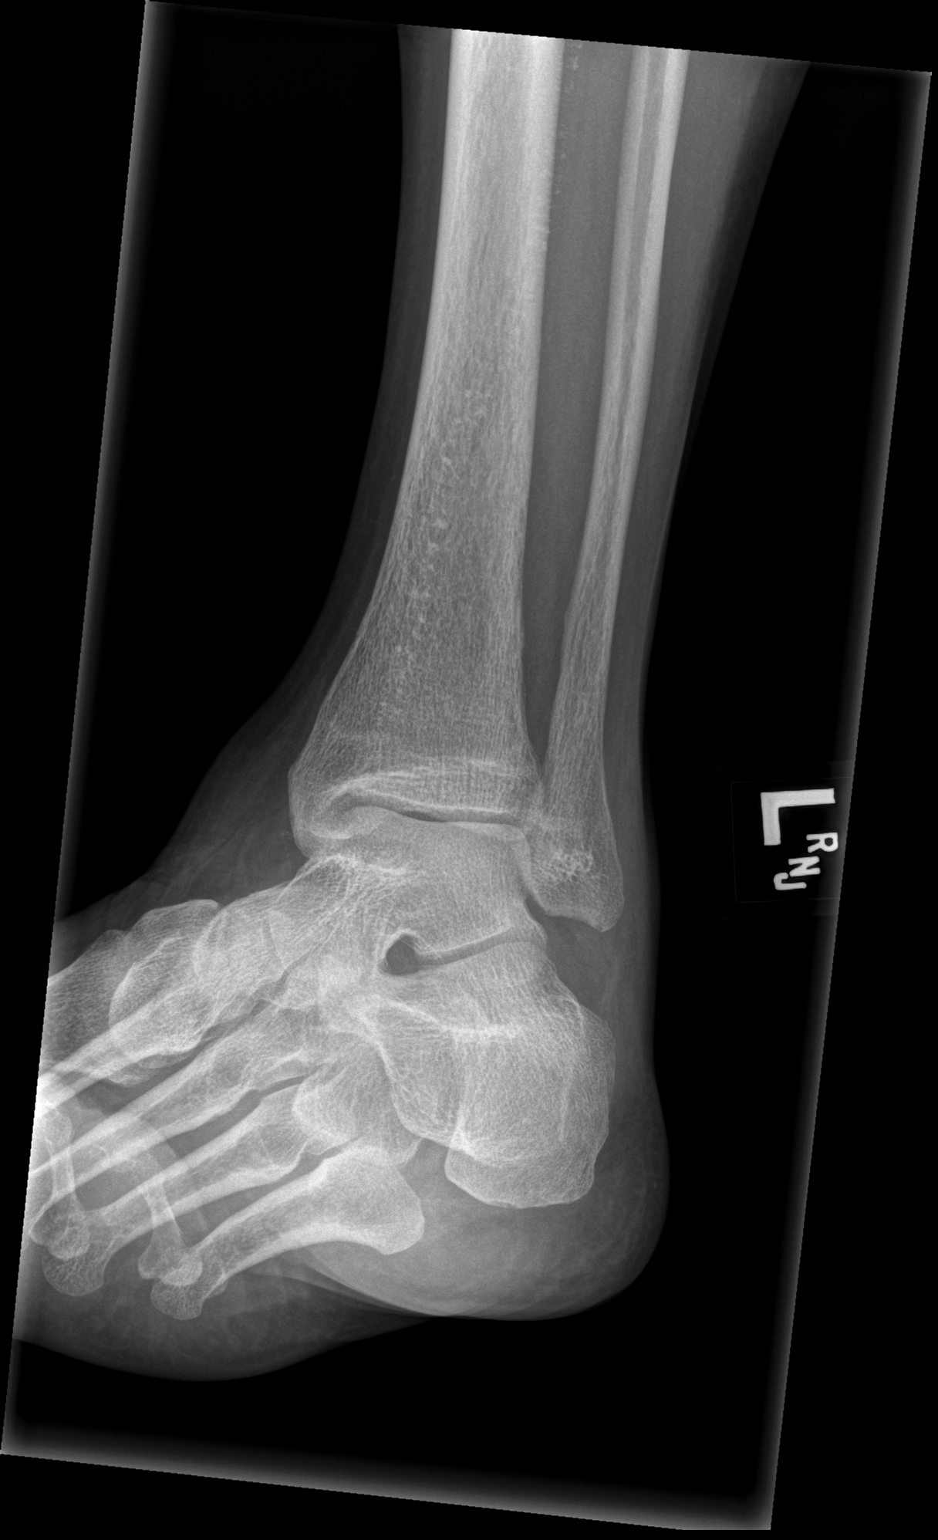

[x ankle lat left]
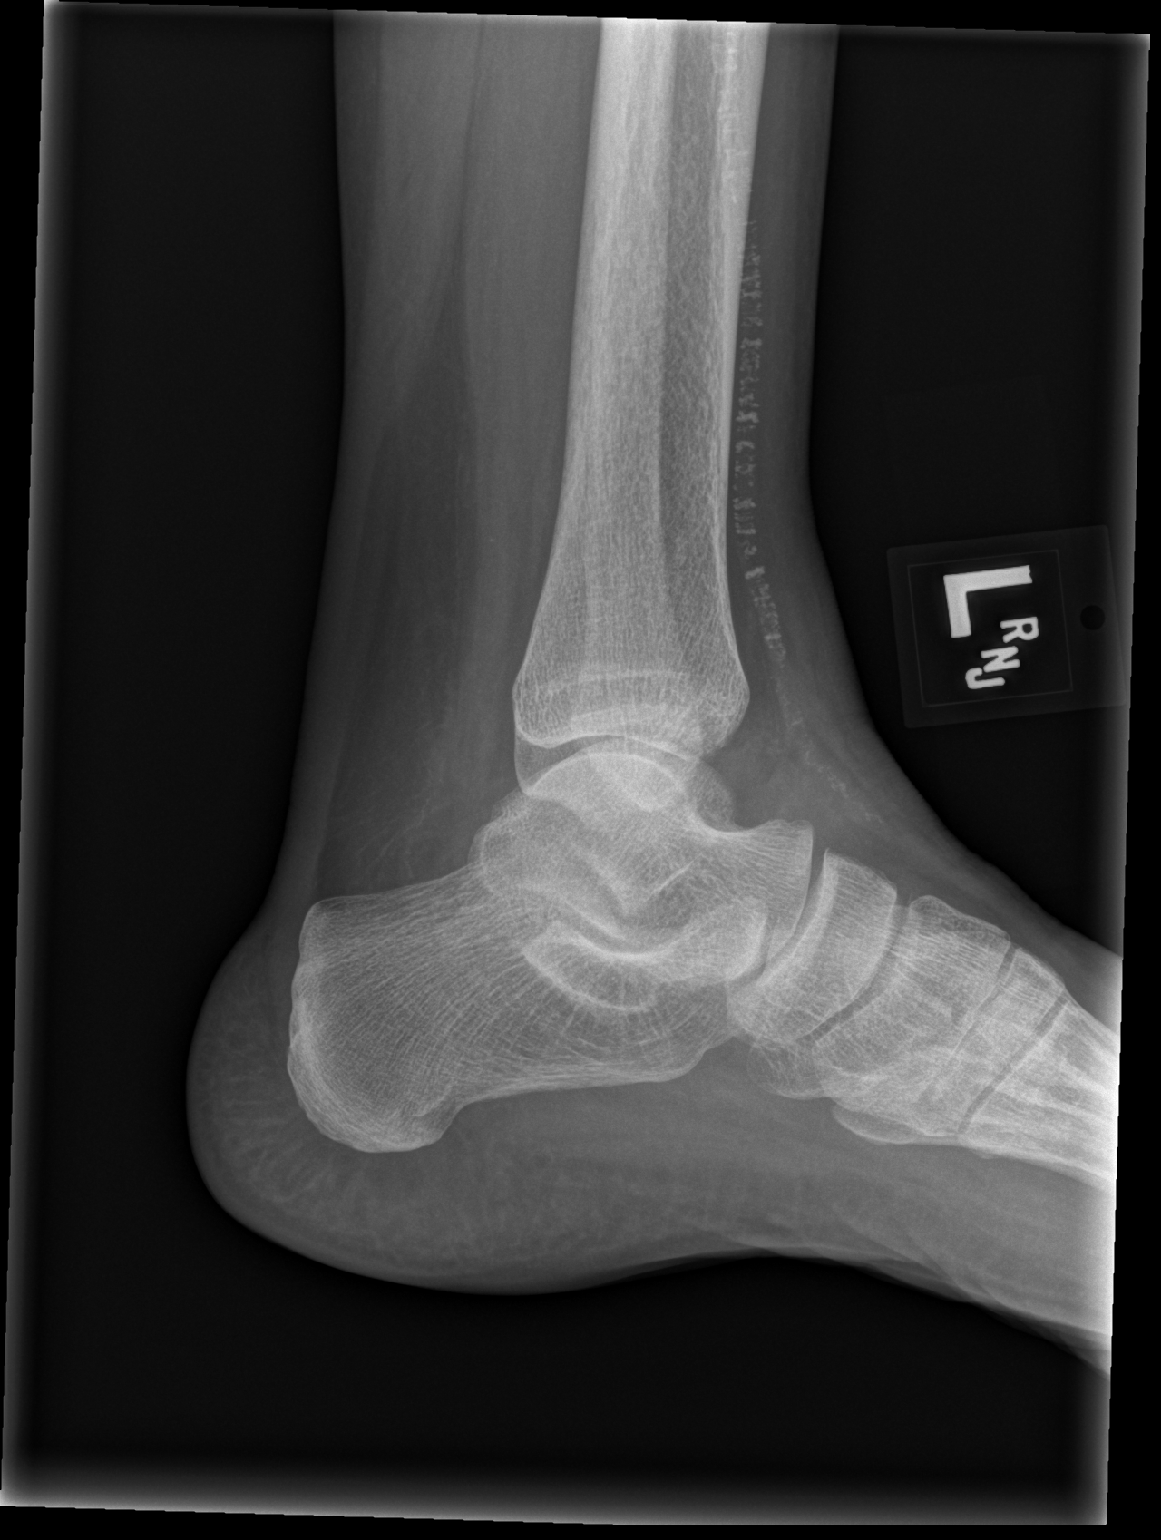

[3 of 3 positions shown; findings below may reference images not displayed]

FINDINGS: Soft tissue swelling. No evidence of fracture or dislocation. No
acute bony abnormality identified. Peripheral vascular calcification
.
IMPRESSION: 1. Soft tissue swelling. No acute bony abnormality. No evidence
fracture dislocation.

2. Peripheral vascular disease.

## 2017-12-07 ENCOUNTER — Ambulatory Visit (INDEPENDENT_AMBULATORY_CARE_PROVIDER_SITE_OTHER): Payer: Self-pay

## 2017-12-07 DIAGNOSIS — I5022 Chronic systolic (congestive) heart failure: Secondary | ICD-10-CM

## 2017-12-07 DIAGNOSIS — Z95810 Presence of automatic (implantable) cardiac defibrillator: Secondary | ICD-10-CM

## 2017-12-07 NOTE — Progress Notes (Signed)
Looks good, thanks!

## 2017-12-07 NOTE — Progress Notes (Signed)
EPIC Encounter for ICM Monitoring  Patient Name: Priscilla Anderson is a 84 y.o. female Date: 12/07/2017 Primary Cardiologist: Croitoru Electrophysiologist: Croitoru Dry Weight:Prior weight 115lbs Bi-V Pacing: 97.6%  Attempted call to patient and unable to reach.  Left detailed message regarding transmission.  Transmission reviewed.    Thoracic impedance returned to normal since last ICM transmission 11/27/2017.   Prescribed dosage: Furosemide 40 mg 1.5 tablets (60 mg total) daily  Labs: 08/11/2017 Creatinine 2.32, BUN 52, Potassium 4.9, Sodium 135, EGFR 25.88 (per result note being referred to nephrologist) 02/07/2017 Creatinine 2.05, BUN 33, Potassium 4.1, Sodium 137 10/31/2016 Creatinine 1.96, BUN 39, Potassium 4.2, Sodium 142  Recommendations: Left voice mail with ICM number and encouraged to call if experiencing any fluid symptoms.  Follow-up plan: ICM clinic phone appointment on 02/06/2018 since she has defib check in the office scheduled 01/03/2018 with Dr. Sallyanne Kuster.  Copy of ICM check sent to Dr. Sallyanne Kuster.   3 month ICM trend: 12/07/2017    1 Year ICM trend:       Rosalene Billings, RN 12/07/2017 1:03 PM

## 2017-12-23 ENCOUNTER — Other Ambulatory Visit: Payer: Self-pay | Admitting: Cardiovascular Disease

## 2017-12-25 NOTE — Telephone Encounter (Signed)
REFILL 

## 2017-12-29 ENCOUNTER — Telehealth: Payer: Self-pay

## 2017-12-29 NOTE — Telephone Encounter (Signed)
Patient called to discuss billing on 11/27/2017.  There are 2 different doctors names on the remote transmission charges. Dr Curt Bears is listed instead of Dr Sallyanne Kuster.  I verified the date of the charges and advised Dr Sallyanne Kuster should be listed as the physician.  Advised I would contact billing department about getting it corrected.  Explained the charges would not be different just the billing physician. She understands and thanked me for the help.

## 2018-01-03 ENCOUNTER — Encounter: Payer: Self-pay | Admitting: Cardiovascular Disease

## 2018-01-03 ENCOUNTER — Ambulatory Visit (INDEPENDENT_AMBULATORY_CARE_PROVIDER_SITE_OTHER): Payer: Medicare Other | Admitting: Cardiovascular Disease

## 2018-01-03 VITALS — BP 102/64 | HR 77 | Ht 64.5 in | Wt 119.0 lb

## 2018-01-03 DIAGNOSIS — I420 Dilated cardiomyopathy: Secondary | ICD-10-CM

## 2018-01-03 DIAGNOSIS — Z95810 Presence of automatic (implantable) cardiac defibrillator: Secondary | ICD-10-CM

## 2018-01-03 DIAGNOSIS — I340 Nonrheumatic mitral (valve) insufficiency: Secondary | ICD-10-CM | POA: Diagnosis not present

## 2018-01-03 DIAGNOSIS — N183 Chronic kidney disease, stage 3 unspecified: Secondary | ICD-10-CM

## 2018-01-03 DIAGNOSIS — E1122 Type 2 diabetes mellitus with diabetic chronic kidney disease: Secondary | ICD-10-CM

## 2018-01-03 DIAGNOSIS — I5022 Chronic systolic (congestive) heart failure: Secondary | ICD-10-CM

## 2018-01-03 DIAGNOSIS — N2889 Other specified disorders of kidney and ureter: Secondary | ICD-10-CM

## 2018-01-03 DIAGNOSIS — E7800 Pure hypercholesterolemia, unspecified: Secondary | ICD-10-CM

## 2018-01-03 DIAGNOSIS — R011 Cardiac murmur, unspecified: Secondary | ICD-10-CM | POA: Diagnosis not present

## 2018-01-03 LAB — CUP PACEART INCLINIC DEVICE CHECK
Date Time Interrogation Session: 20190220120103
Implantable Lead Implant Date: 20030708
Implantable Lead Implant Date: 20070501
Implantable Lead Implant Date: 20111019
Implantable Lead Location: 753858
Implantable Lead Location: 753859
Implantable Lead Location: 753860
Implantable Lead Location: 753860
Implantable Pulse Generator Implant Date: 20150915
Lead Channel Setting Pacing Amplitude: 3 V
Lead Channel Setting Pacing Pulse Width: 0.4 ms
MDC IDC LEAD IMPLANT DT: 20070503
MDC IDC SET LEADCHNL LV PACING AMPLITUDE: 2.5 V
MDC IDC SET LEADCHNL RA PACING AMPLITUDE: 2 V
MDC IDC SET LEADCHNL RV PACING PULSEWIDTH: 0.4 ms
MDC IDC SET LEADCHNL RV SENSING SENSITIVITY: 0.3 mV

## 2018-01-03 NOTE — Progress Notes (Signed)
Patient ID: Priscilla Anderson, female   DOB: December 11, 1933, 84 y.o.   MRN: [id number]    Cardiology Office Note    Date:  01/04/2018   ID:  Priscilla, Anderson November 01, 1934, MRN [id number]  PCP:  Biagio Borg, MD  Cardiologist: Loralie Champagne, MD; Sanda Klein, MD   Chief Complaint  Patient presents with  . Follow-up    6 months. Patient says hands and fingers fill numb at the time, mostly the right hand.    History of Present Illness:  Priscilla Anderson is a 84 y.o. female with long-standing nonischemic dilated cardiomyopathy (EF 20% Sep 2016) and severely depressed left ventricular systolic function, moderate-severe mitral regurgitation, mild and nonobstructive CAD  and a biventricular pacemaker/defibrillator who returns in follow-up.   She denies problems with dyspnea, dizziness, syncope, palpitations, chest pain, focal neurological events or bleeding problems.  Really her only complaint today is bilateral finger numbness in a pattern that is very suggestive of carpal tunnel syndrome.  She has not had any issues with volume retention.  She has not required any significant adjustments in diuretic dose.  Her weight is very steady at 116-117 pounds on her home scale (our office scale reads about 2 pounds more).  Her optive all does show a brief increase around Christmas and she admits to some sodium indiscretion around that time, but it quickly returned to normal range.  She continues to live independently and takes care of most of her own housework, at a slower pace.  Device interrogation (Medtronic Viva XT CRT-D) today shows normal BiV-ICD function with an estimated generator longevity of 16 months. Lead parameters are favorable and stable. Activity level isunchanged at approximately 0.9 hour per day. She has 97% biventricular pacing (1-2% VSRP) and 61% atrial pacing. She has not had any ventricular tachycardia or atrial fibrillation.    Past Medical History:  Diagnosis Date  . Adenomatous  colon polyp 2004  . AICD (automatic cardioverter/defibrillator) present   . CAD (coronary artery disease)   . CHF (congestive heart failure) ([place])   . Hyperlipidemia   . Hypertension   . Nonischemic cardiomyopathy ([place]) 09/22/2011  . Osteopenia   . Polyp of larynx   . Type II diabetes mellitus ([place])     Past Surgical History:  Procedure Laterality Date  . BIV ICD GENERTAOR CHANGE OUT N/A 07/29/2014   Procedure: BIV ICD GENERTAOR CHANGE OUT;  Surgeon: Sanda Klein, MD;  Location: [facility] CATH LAB;  Service: Cardiovascular;  Laterality: N/A;  . BREAST CYST EXCISION Left 1980  . CARDIAC CATHETERIZATION  07/28/2005   noncritical CAD  . CARDIAC CATHETERIZATION     "I've had several"  . CARDIAC DEFIBRILLATOR PLACEMENT  05/21/2002   s/p re-do pacemaker/defibrillator October 2011- [place].  . COLONOSCOPY    . IMPLANTABLE CARDIOVERTER DEFIBRILLATOR GENERATOR CHANGE  09/01/2010   Medtronic  . MICROLARYNGOSCOPY WITH CO2 LASER AND EXCISION OF VOCAL CORD LESION  08/2002   Archie Endo 03/29/2011  . VAGINAL HYSTERECTOMY      Outpatient Medications Prior to Visit  Medication Sig Dispense Refill  . Artificial Tear Solution (SOOTHE XP) SOLN Apply 1 drop to eye daily as needed (dry eyes).    . Ascorbic Acid (VITAMIN C) 500 MG tablet Take 500 mg by mouth daily.      Marland Kitchen aspirin 81 MG tablet Take 81 mg by mouth daily.      . carvedilol (COREG) 12.5 MG tablet TAKE 1 TABLET TWICE A DAY 180 tablet 2  .  diazepam (VALIUM) 5 MG tablet Take 1 tablet (5 mg total) by mouth at bedtime as needed for anxiety. 90 tablet 1  . furosemide (LASIX) 40 MG tablet Take 1.5 tablets (60 mg total) by mouth daily. 135 tablet 3  . glucose blood (ACCU-CHEK AVIVA) test strip Test two times daily. Code 250.02 100 each 11  . ibandronate (BONIVA) 150 MG tablet TAKE 1 TABLET ONCE A MONTH. 9 tablet 3  . ivabradine (CORLANOR) 5 MG TABS tablet Take 1 tablet (5 mg total) by mouth 2 (two) times daily with a meal. 180 tablet 1  . JANUVIA  100 MG tablet TAKE 1 TABLET DAILY 90 tablet 1  . Lancets MISC Test as directed two times daily. Code 250.02 100 each 11  . losartan (COZAAR) 50 MG tablet TAKE 1 TABLET DAILY 90 tablet 0  . metFORMIN (GLUCOPHAGE-XR) 500 MG 24 hr tablet Take 1 tablet (500 mg total) by mouth daily. 90 tablet 3  . pantoprazole (PROTONIX) 40 MG tablet TAKE 1 TABLET TWICE A DAY 180 tablet 1  . saccharomyces boulardii (FLORASTOR) 250 MG capsule Take 1 capsule (250 mg total) by mouth 2 (two) times daily. 60 capsule 1  . Simethicone (GAS RELIEF) 180 MG CAPS Take 180 mg by mouth 2 (two) times daily. Takes 2 after meals and takes 2 at bedtime    . simvastatin (ZOCOR) 20 MG tablet Take 1 tablet (20 mg total) by mouth at bedtime. NEED OV. 90 tablet 1  . spironolactone (ALDACTONE) 25 MG tablet TAKE 1 TABLET DAILY 90 tablet 2   No facility-administered medications prior to visit.      Allergies:   Lanoxin [digoxin]   Social History   Socioeconomic History  . Marital status: Widowed    Spouse name: None  . Number of children: 2  . Years of education: None  . Highest education level: None  Social Needs  . Financial resource strain: None  . Food insecurity - worry: None  . Food insecurity - inability: None  . Transportation needs - medical: None  . Transportation needs - non-medical: None  Occupational History  . Occupation: retired Investment banker, corporate. report clerk    Employer: RETIRED  Tobacco Use  . Smoking status: Never Smoker  . Smokeless tobacco: Never Used  Substance and Sexual Activity  . Alcohol use: No    Alcohol/week: 0.0 oz  . Drug use: Yes    Frequency: 2.0 times per week  . Sexual activity: None  Other Topics Concern  . None  Social History Narrative   Husband now with Multiple Myeloma- see Dr. Juanita Craver     Family History:  The patient's family history includes Arthritis in her father and mother; Diabetes in her other; Heart disease in her brother; Prostate cancer in her brother.    ROS:   Please see the history of present illness.    ROS All other systems reviewed and are negative.   PHYSICAL EXAM:   VS:  BP 102/64   Pulse 77   Ht 5' 4.5" (1.638 m)   Wt 119 lb (54 kg)   BMI 20.11 kg/m      General: Alert, oriented x3, no distress, she is very slender but not truly cachectic Head: no evidence of trauma, PERRL, EOMI, no exophtalmos or lid lag, no myxedema, no xanthelasma; normal ears, nose and oropharynx Neck: normal jugular venous pulsations and no hepatojugular reflux; brisk carotid pulses without delay and no carotid bruits Chest: clear to auscultation, no signs of  consolidation by percussion or palpation, normal fremitus, symmetrical and full respiratory excursions healthy left subclavian device site,  Cardiovascular:  Laterally displaced apical impulse, paradoxically split S2, RRR; grade 3/6 holosystolic apical murmur no diastolic murmurs, rubs, or gallops,no edema  Abdomen: no tenderness or distention, no masses by palpation, no abnormal pulsatility or arterial bruits, normal bowel sounds, no hepatosplenomegaly Extremities: no clubbing, cyanosis or edema; 2+ radial, ulnar and brachial pulses bilaterally; 2+ right femoral, posterior tibial and dorsalis pedis pulses; 2+ left femoral, posterior tibial and dorsalis pedis pulses; no subclavian or femoral bruits Neurological: grossly nonfocal Psych: Normal mood and affect   Wt Readings from Last 3 Encounters:  01/03/18 119 lb (54 kg)  08/11/17 115 lb (52.2 kg)  05/11/17 113 lb (51.3 kg)      Studies/Labs Reviewed:   EKG:  EKG is ordered today.  Atrial sensed (sinus), ventricular paced rhythm with R wave in V1 consistent with effective left ventricular activation.  The QTC is 563 ms  Recent Labs: 08/11/2017: ALT 8; BUN 52; Creatinine, Ser 2.32; Hemoglobin 12.3; Platelets 202.0; Potassium 4.9; Sodium 135; TSH 1.78   Lipid Panel    Component Value Date/Time   CHOL 158 08/11/2017 1426   TRIG 111.0  08/11/2017 1426   HDL 53.40 08/11/2017 1426   CHOLHDL 3 08/11/2017 1426   VLDL 22.2 08/11/2017 1426   LDLCALC 82 08/11/2017 1426    ASSESSMENT:    1. Chronic systolic CHF (congestive heart failure) ([place])   2. Nonischemic dilated cardiomyopathy ([place])   3. Non-rheumatic mitral regurgitation   4. Biventricular ICD (implantable cardioverter-defibrillator) in place   5. Chronic renal insufficiency, stage III (moderate) ([facility])   6. Pure hypercholesterolemia   7. Controlled type 2 diabetes mellitus with stage 3 chronic kidney disease, without long-term current use of insulin ([place])   8. Cardiac murmur      PLAN:  In order of problems listed above:  1. CHF: She appears to be clinically euvolemic.  Thoracic impedance only showed a very slight and brief deviation in December and she has not required diuretic dose changes.  Her blood pressure remains rather low and there is no room to increase heart failure medications (she is on maximum tolerated doses of carvedilol, losartan, spironolactone and takes Corlanor).  She is on a relatively low dose of loop diuretic.  Lowest recent recorded BNP was 792, but she was probably a little "wet" when that was drawn in December 2017.  Her "dry weight" appears to be around 116-117 pounds on her home scale. 2. CMP:  EF 20% by echo, 17% by scintigraphy. Moderate nonobstructive coronary artery disease by previous cath. Anterior scar without ischemia on nuclear study recently 3. MR: This is likely functional, related to annular dilatation, since it severity appears to fluctuate with the degree of heart failure decompensation.  Despite what appears to be normal volume status clinically, her mitral regurgitation murmur sounds very loud today.  I have recommended a repeat echocardiogram. 4. CRT-D: Pretty good prevalence of biventricular pacing, stable over multiple checks.  Normal device function.  Anticipate need for generator change out in about a year and a half.   Consider downgrading the device to CRT-P at that time since she will be around 84 years old at that time. She has never required tachycardia therapies from her defibrillator.. CareLink download every 3 months and office visit in 6 months. 5. CKD stage 3-4: Creatinine around baseline of 1.8-2.0,  may be worsening a little recently 6. HLP: Satisfactory  lipid profile  7. DM: Excellent and stable glycemic control, most recent hemoglobin A1c at goal 6.6%  .     Medication Adjustments/Labs and Tests Ordered: Current medicines are reviewed at length with the patient today.  Concerns regarding medicines are outlined above.  Medication changes, Labs and Tests ordered today are listed in the Patient Instructions below. Patient Instructions  Dr Sallyanne Kuster recommends that you continue on your current medications as directed. Please refer to the Current Medication list given to you today.  Your physician has requested that you have an echocardiogram. Echocardiography is a painless test that uses sound waves to create images of your heart. It provides your doctor with information about the size and shape of your heart and how well your heart's chambers and valves are working. This procedure takes approximately one hour. There are no restrictions for this procedure. >>This will be performed at our [facility] location: [address]  Remote monitoring is used to monitor your Pacemaker or ICD from home. This monitoring reduces the number of office visits required to check your device to one time per year. It allows [place] to keep an eye on the functioning of your device to ensure it is working properly. You are scheduled for a device check from home on Tuesday, March 26th, 2019 AND Monday, April 15th, 2019. You may send your transmission at any time that day. If you have a wireless device, the transmission will be sent automatically. After your physician reviews your transmission, you will receive a  notification with your next transmission date.  Dr Sallyanne Kuster recommends that you schedule a follow-up appointment in 12 months with an ICD check. You will receive a reminder letter in the mail two months in advance. If you don't receive a letter, please call our office to schedule the follow-up appointment.  If you need a refill on your cardiac medications before your next appointment, please call your pharmacy. Signed, Sanda Klein, MD  01/04/2018 6:47 PM    [address], [place], [place]  [address] Phone: [phone]; Fax: [phone]

## 2018-01-03 NOTE — Patient Instructions (Addendum)
Dr Sallyanne Kuster recommends that you continue on your current medications as directed. Please refer to the Current Medication list given to you today.  Your physician has requested that you have an echocardiogram. Echocardiography is a painless test that uses sound waves to create images of your heart. It provides your doctor with information about the size and shape of your heart and how well your heart's chambers and valves are working. This procedure takes approximately one hour. There are no restrictions for this procedure. >>This will be performed at our [facility] location: [address], [address]  Remote monitoring is used to monitor your Pacemaker or ICD from home. This monitoring reduces the number of office visits required to check your device to one time per year. It allows [place] to keep an eye on the functioning of your device to ensure it is working properly. You are scheduled for a device check from home on Tuesday, March 26th, 2019 AND Monday, April 15th, 2019. You may send your transmission at any time that day. If you have a wireless device, the transmission will be sent automatically. After your physician reviews your transmission, you will receive a notification with your next transmission date.  Dr Sallyanne Kuster recommends that you schedule a follow-up appointment in 12 months with an ICD check. You will receive a reminder letter in the mail two months in advance. If you don't receive a letter, please call our office to schedule the follow-up appointment.  If you need a refill on your cardiac medications before your next appointment, please call your pharmacy.

## 2018-01-16 ENCOUNTER — Ambulatory Visit (HOSPITAL_COMMUNITY): Payer: Medicare Other | Attending: Cardiovascular Disease

## 2018-01-16 ENCOUNTER — Other Ambulatory Visit: Payer: Self-pay

## 2018-01-16 DIAGNOSIS — I420 Dilated cardiomyopathy: Secondary | ICD-10-CM | POA: Diagnosis not present

## 2018-01-16 DIAGNOSIS — E119 Type 2 diabetes mellitus without complications: Secondary | ICD-10-CM | POA: Insufficient documentation

## 2018-01-16 DIAGNOSIS — I2510 Atherosclerotic heart disease of native coronary artery without angina pectoris: Secondary | ICD-10-CM | POA: Diagnosis not present

## 2018-01-16 DIAGNOSIS — R011 Cardiac murmur, unspecified: Secondary | ICD-10-CM | POA: Diagnosis not present

## 2018-01-16 DIAGNOSIS — I509 Heart failure, unspecified: Secondary | ICD-10-CM | POA: Diagnosis not present

## 2018-01-16 DIAGNOSIS — E785 Hyperlipidemia, unspecified: Secondary | ICD-10-CM | POA: Diagnosis not present

## 2018-01-16 DIAGNOSIS — Z8249 Family history of ischemic heart disease and other diseases of the circulatory system: Secondary | ICD-10-CM | POA: Insufficient documentation

## 2018-01-16 DIAGNOSIS — I340 Nonrheumatic mitral (valve) insufficiency: Secondary | ICD-10-CM

## 2018-01-16 DIAGNOSIS — I110 Hypertensive heart disease with heart failure: Secondary | ICD-10-CM | POA: Diagnosis not present

## 2018-01-20 ENCOUNTER — Other Ambulatory Visit: Payer: Self-pay | Admitting: Internal Medicine

## 2018-01-23 ENCOUNTER — Other Ambulatory Visit: Payer: Self-pay | Admitting: Cardiovascular Disease

## 2018-01-23 ENCOUNTER — Other Ambulatory Visit: Payer: Self-pay | Admitting: Internal Medicine

## 2018-01-24 ENCOUNTER — Other Ambulatory Visit: Payer: Self-pay | Admitting: Cardiology

## 2018-02-01 ENCOUNTER — Other Ambulatory Visit: Payer: Self-pay | Admitting: Internal Medicine

## 2018-02-02 ENCOUNTER — Other Ambulatory Visit: Payer: Self-pay | Admitting: Internal Medicine

## 2018-02-02 DIAGNOSIS — I5022 Chronic systolic (congestive) heart failure: Secondary | ICD-10-CM

## 2018-02-02 DIAGNOSIS — I10 Essential (primary) hypertension: Secondary | ICD-10-CM

## 2018-02-06 ENCOUNTER — Ambulatory Visit (INDEPENDENT_AMBULATORY_CARE_PROVIDER_SITE_OTHER): Payer: Medicare Other

## 2018-02-06 DIAGNOSIS — Z95810 Presence of automatic (implantable) cardiac defibrillator: Secondary | ICD-10-CM

## 2018-02-06 DIAGNOSIS — I5022 Chronic systolic (congestive) heart failure: Secondary | ICD-10-CM | POA: Diagnosis not present

## 2018-02-06 NOTE — Progress Notes (Signed)
EPIC Encounter for ICM Monitoring  Patient Name: Priscilla Anderson is a 84 y.o. female Date: 02/06/2018 Primary Care Physican: John, James W, MD Primary Cardiologist:Croitoru Electrophysiologist: Croitoru Dry Weight: 117lbs Bi-V Pacing: 98.2%      Heart Failure questions reviewed, pt asymptomatic.  She joined the [employer] and doing some exercise.   Thoracic impedance slightly below baseline normal.  Prescribed dosage: Furosemide 40 mg 1.5 tablets (60 mg total) daily  Labs: 08/11/2017 Creatinine 2.32, BUN 52, Potassium 4.9, Sodium 135, EGFR 25.88 (per result note being referred to nephrologist) 02/07/2017 Creatinine 2.05, BUN 33, Potassium 4.1, Sodium 137 10/31/2016 Creatinine 1.96, BUN 39, Potassium 4.2, Sodium 142  Recommendations: No changes.  Encouraged to call for fluid symptoms.  Follow-up plan: ICM clinic phone appointment on 03/12/2018.    Copy of ICM check sent to Dr. Croitoru.   3 month ICM trend: 02/06/2018    1 Year ICM trend:       Laurie S Short, RN 02/06/2018 10:20 AM   

## 2018-02-08 ENCOUNTER — Encounter: Payer: Self-pay | Admitting: Internal Medicine

## 2018-02-08 ENCOUNTER — Ambulatory Visit (INDEPENDENT_AMBULATORY_CARE_PROVIDER_SITE_OTHER): Payer: Medicare Other | Admitting: Internal Medicine

## 2018-02-08 ENCOUNTER — Other Ambulatory Visit (INDEPENDENT_AMBULATORY_CARE_PROVIDER_SITE_OTHER): Payer: Medicare Other

## 2018-02-08 VITALS — BP 108/68 | HR 78 | Temp 97.9°F | Ht 64.5 in | Wt 120.0 lb

## 2018-02-08 DIAGNOSIS — F411 Generalized anxiety disorder: Secondary | ICD-10-CM | POA: Diagnosis not present

## 2018-02-08 DIAGNOSIS — I10 Essential (primary) hypertension: Secondary | ICD-10-CM | POA: Diagnosis not present

## 2018-02-08 DIAGNOSIS — N183 Chronic kidney disease, stage 3 unspecified: Secondary | ICD-10-CM

## 2018-02-08 DIAGNOSIS — E1122 Type 2 diabetes mellitus with diabetic chronic kidney disease: Secondary | ICD-10-CM | POA: Diagnosis not present

## 2018-02-08 DIAGNOSIS — E7800 Pure hypercholesterolemia, unspecified: Secondary | ICD-10-CM

## 2018-02-08 LAB — HEPATIC FUNCTION PANEL
ALK PHOS: 42 U/L (ref 39–117)
ALT: 13 U/L (ref 0–35)
AST: 19 U/L (ref 0–37)
Albumin: 4.4 g/dL (ref 3.5–5.2)
BILIRUBIN DIRECT: 0.1 mg/dL (ref 0.0–0.3)
BILIRUBIN TOTAL: 0.4 mg/dL (ref 0.2–1.2)
Total Protein: 7.2 g/dL (ref 6.0–8.3)

## 2018-02-08 LAB — LIPID PANEL
Cholesterol: 139 mg/dL (ref 0–200)
HDL: 54.2 mg/dL (ref >39.00)
LDL Cholesterol: 64 mg/dL (ref 0–99)
NONHDL: 85.09
Total CHOL/HDL Ratio: 3
Triglycerides: 103 mg/dL (ref 0.0–149.0)
VLDL: 20.6 mg/dL (ref 0.0–40.0)

## 2018-02-08 LAB — BASIC METABOLIC PANEL
BUN: 42 mg/dL — AB (ref 6–23)
CALCIUM: 9.6 mg/dL (ref 8.4–10.5)
CO2: 30 mEq/L (ref 19–32)
CREATININE: 2.21 mg/dL — AB (ref 0.40–1.20)
Chloride: 100 mEq/L (ref 96–112)
GFR: 27.34 mL/min — AB (ref >60.00)
GLUCOSE: 112 mg/dL — AB (ref 70–99)
POTASSIUM: 4.5 meq/L (ref 3.5–5.1)
Sodium: 137 mEq/L (ref 135–145)

## 2018-02-08 LAB — HEMOGLOBIN A1C: HEMOGLOBIN A1C: 6.7 % — AB (ref 4.6–6.5)

## 2018-02-08 MED ORDER — DIAZEPAM 5 MG PO TABS: 5.0000 mg | ORAL_TABLET | Freq: Every evening | ORAL | 1 refills | Status: AC | PRN

## 2018-02-08 MED ORDER — METFORMIN HCL ER 500 MG PO TB24: 500.0000 mg | ORAL_TABLET | Freq: Every day | ORAL | 3 refills | Status: AC

## 2018-02-08 NOTE — Progress Notes (Signed)
Subjective:    Patient ID: Priscilla Anderson, female    DOB: [birth year], 84 y.o.   MRN: [id number]  HPI  Here to f/u; overall doing ok,  Pt denies chest pain, increasing sob or doe, wheezing, orthopnea, PND, increased LE swelling, palpitations, dizziness or syncope.  Pt denies new neurological symptoms such as new headache, or facial or extremity weakness or numbness.  Pt denies polydipsia, polyuria, or low sugar episode.  Pt states overall good compliance with meds, mostly trying to follow appropriate diet, with wt overall stable, trying to n ow be more active, just [facility] [employer] [facility]  Also saw Dr Upton/nephrology recently, no labs done then, pt to f/u 6 mo.  Saw cardiology with echo - EF 20%, still very active with exercise  classes near daily at [employer].  No other complaints or interval hx Past Medical History:  Diagnosis Date  . Adenomatous colon polyp 2004  . AICD (automatic cardioverter/defibrillator) present   . CAD (coronary artery disease)   . CHF (congestive heart failure) ([place])   . Hyperlipidemia   . Hypertension   . Nonischemic cardiomyopathy ([place]) 09/22/2011  . Osteopenia   . Polyp of larynx   . Type II diabetes mellitus ([place])    Past Surgical History:  Procedure Laterality Date  . BIV ICD GENERTAOR CHANGE OUT N/A 07/29/2014   Procedure: BIV ICD GENERTAOR CHANGE OUT;  Surgeon: Sanda Klein, MD;  Location: [facility] CATH LAB;  Service: Cardiovascular;  Laterality: N/A;  . BREAST CYST EXCISION Left 1980  . CARDIAC CATHETERIZATION  07/28/2005   noncritical CAD  . CARDIAC CATHETERIZATION     "I've had several"  . CARDIAC DEFIBRILLATOR PLACEMENT  05/21/2002   s/p re-do pacemaker/defibrillator October 2011- [place].  . COLONOSCOPY    . IMPLANTABLE CARDIOVERTER DEFIBRILLATOR GENERATOR CHANGE  09/01/2010   Medtronic  . MICROLARYNGOSCOPY WITH CO2 LASER AND EXCISION OF VOCAL CORD LESION  08/2002   Archie Endo 03/29/2011  . VAGINAL HYSTERECTOMY      reports that she has  never smoked. She has never used smokeless tobacco. She reports that she has current or past drug history. Frequency: 2.00 times per week. She reports that she does not drink alcohol. family history includes Arthritis in her father and mother; Diabetes in her other; Heart disease in her brother; Prostate cancer in her brother. Allergies  Allergen Reactions  . Lanoxin [Digoxin] Nausea Only    Nausea and wgt loss   Current Outpatient Medications on File Prior to Visit  Medication Sig Dispense Refill  . ACCU-CHEK AVIVA PLUS test strip USE TO TEST BLOOD SUGAR TWICE DAILY. 100 each 0  . ACCU-CHEK SOFTCLIX LANCETS lancets USE TO TEST BLOOD SUGAR TWICE DAILY. 100 each 0  . Artificial Tear Solution (SOOTHE XP) SOLN Apply 1 drop to eye daily as needed (dry eyes).    . Ascorbic Acid (VITAMIN C) 500 MG tablet Take 500 mg by mouth daily.      Marland Kitchen aspirin 81 MG tablet Take 81 mg by mouth daily.      . CORLANOR 5 MG TABS tablet TAKE 1 TABLET TWICE A DAY WITH MEALS 60 tablet 0  . furosemide (LASIX) 40 MG tablet TAKE ONE AND ONE-HALF TABLETS DAILY (CHANGE IN DOSE) 135 tablet 0  . ibandronate (BONIVA) 150 MG tablet TAKE 1 TABLET ONCE A MONTH. 9 tablet 3  . JANUVIA 100 MG tablet TAKE 1 TABLET DAILY 90 tablet 1  . losartan (COZAAR) 50 MG tablet TAKE 1  TABLET DAILY 90 tablet 3  . pantoprazole (PROTONIX) 40 MG tablet TAKE 1 TABLET TWICE A DAY 180 tablet 1  . saccharomyces boulardii (FLORASTOR) 250 MG capsule Take 1 capsule (250 mg total) by mouth 2 (two) times daily. 60 capsule 1  . Simethicone (GAS RELIEF) 180 MG CAPS Take 180 mg by mouth 2 (two) times daily. Takes 2 after meals and takes 2 at bedtime    . simvastatin (ZOCOR) 20 MG tablet Take 1 tablet (20 mg total) by mouth at bedtime. NEED OV. 90 tablet 1  . spironolactone (ALDACTONE) 25 MG tablet TAKE 1 TABLET DAILY 90 tablet 2   No current facility-administered medications on file prior to visit.    Review of Systems  Constitutional: Negative for other  unusual diaphoresis or sweats HENT: Negative for ear discharge or swelling Eyes: Negative for other worsening visual disturbances Respiratory: Negative for stridor or other swelling  Gastrointestinal: Negative for worsening distension or other blood Genitourinary: Negative for retention or other urinary change Musculoskeletal: Negative for other MSK pain or swelling Skin: Negative for color change or other new lesions Neurological: Negative for worsening tremors and other numbness  Psychiatric/Behavioral: Negative for worsening agitation or other fatigue All other system neg per pt    Objective:   Physical Exam BP 108/68   Pulse 78   Temp 97.9 F (36.6 C) (Oral)   Ht 5' 4.5" (1.638 m)   Wt 120 lb (54.4 kg)   SpO2 98%   BMI 20.28 kg/m  VS noted, not ill appearing Constitutional: Pt appears in NAD HENT: Head: NCAT.  Right Ear: External ear normal.  Left Ear: External ear normal.  Eyes: . Pupils are equal, round, and reactive to light. Conjunctivae and EOM are normal Nose: without d/c or deformity Neck: Neck supple. Gross normal ROM Cardiovascular: Normal rate and regular rhythm.   Pulmonary/Chest: Effort normal and breath sounds without rales or wheezing.  Neurological: Pt is alert. At baseline orientation, motor grossly intact Skin: Skin is warm. No rashes, other new lesions, no LE edema Psychiatric: Pt behavior is normal without agitation , mild nervous, not depressed affect No other exam findings Lab Results  Component Value Date   WBC 7.8 08/11/2017   HGB 12.3 08/11/2017   HCT 37.7 08/11/2017   PLT 202.0 08/11/2017   GLUCOSE 112 (H) 02/08/2018   CHOL 139 02/08/2018   TRIG 103.0 02/08/2018   HDL 54.20 02/08/2018   LDLCALC 64 02/08/2018   ALT 13 02/08/2018   AST 19 02/08/2018   NA 137 02/08/2018   K 4.5 02/08/2018   CL 100 02/08/2018   CREATININE 2.21 (H) 02/08/2018   BUN 42 (H) 02/08/2018   CO2 30 02/08/2018   TSH 1.78 08/11/2017   INR 1.02 07/22/2014    HGBA1C 6.7 (H) 02/08/2018   MICROALBUR <0.7 03/05/2015         Assessment & Plan:

## 2018-02-08 NOTE — Patient Instructions (Signed)
Please continue all other medications as before, and refills have been done if requested.  Please have the pharmacy call with any other refills you may need.  Please keep your appointments with your specialists as you may have planned  Please go to the LAB in the Basement (turn left off the elevator) for the tests to be done today  You will be contacted by phone if any changes need to be made immediately.  Otherwise, you will receive a letter about your results with an explanation, but please check with MyChart first.  Please remember to sign up for MyChart if you have not done so, as this will be important to you in the future with finding out test results, communicating by private email, and scheduling acute appointments online when needed.  Please return in 6 months, or sooner if needed 

## 2018-02-09 ENCOUNTER — Other Ambulatory Visit: Payer: Self-pay | Admitting: Cardiovascular Disease

## 2018-02-10 NOTE — Assessment & Plan Note (Signed)
stable overall by history and exam, recent data reviewed with pt, and pt to continue medical treatment as before,  to f/u any worsening symptoms or concerns Lab Results  Component Value Date   HGBA1C 6.7 (H) 02/08/2018

## 2018-02-10 NOTE — Assessment & Plan Note (Signed)
stable overall by history and exam, recent data reviewed with pt, and pt to continue medical treatment as before,  to f/u any worsening symptoms or concerns Lab Results  Component Value Date   LDLCALC 64 02/08/2018

## 2018-02-10 NOTE — Assessment & Plan Note (Signed)
stable overall by history and exam, recent data reviewed with pt, and pt to continue medical treatment as before,  to f/u any worsening symptoms or concerns BP Readings from Last 3 Encounters:  02/08/18 108/68  01/03/18 102/64  08/11/17 110/78

## 2018-02-10 NOTE — Assessment & Plan Note (Signed)
stable overall by history and exam, recent data reviewed with pt, and pt to continue medical treatment as before,  to f/u any worsening symptoms or concerns, cont f/u with renal 

## 2018-02-10 NOTE — Assessment & Plan Note (Signed)
Stable, for valium refill,  to f/u any worsening symptoms or concerns  

## 2018-02-19 ENCOUNTER — Other Ambulatory Visit: Payer: Self-pay | Admitting: Internal Medicine

## 2018-02-20 ENCOUNTER — Other Ambulatory Visit: Payer: Self-pay | Admitting: Internal Medicine

## 2018-03-06 ENCOUNTER — Other Ambulatory Visit (HOSPITAL_COMMUNITY): Payer: Self-pay | Admitting: *Deleted

## 2018-03-06 MED ORDER — IVABRADINE HCL 5 MG PO TABS: 5.0000 mg | ORAL_TABLET | Freq: Two times a day (BID) | ORAL | 3 refills | Status: DC

## 2018-03-12 ENCOUNTER — Ambulatory Visit (INDEPENDENT_AMBULATORY_CARE_PROVIDER_SITE_OTHER): Payer: Medicare Other | Admitting: *Deleted

## 2018-03-12 DIAGNOSIS — I420 Dilated cardiomyopathy: Secondary | ICD-10-CM | POA: Diagnosis not present

## 2018-03-12 DIAGNOSIS — Z95810 Presence of automatic (implantable) cardiac defibrillator: Secondary | ICD-10-CM

## 2018-03-12 DIAGNOSIS — I5022 Chronic systolic (congestive) heart failure: Secondary | ICD-10-CM

## 2018-03-12 NOTE — Progress Notes (Signed)
Remote ICD transmission.   

## 2018-03-13 ENCOUNTER — Encounter: Payer: Self-pay | Admitting: Cardiology

## 2018-03-13 ENCOUNTER — Other Ambulatory Visit (HOSPITAL_COMMUNITY): Payer: Self-pay | Admitting: *Deleted

## 2018-03-13 MED ORDER — IVABRADINE HCL 5 MG PO TABS: 5.0000 mg | ORAL_TABLET | Freq: Two times a day (BID) | ORAL | 3 refills | Status: AC

## 2018-03-13 NOTE — Progress Notes (Signed)
Looks good, thanks [employer]

## 2018-03-13 NOTE — Progress Notes (Signed)
EPIC Encounter for ICM Monitoring  Patient Name: Priscilla Anderson is a 84 y.o. female Date: 03/13/2018 Primary Care Physican: Biagio Borg, MD Primary Cardiologist:Croitoru Electrophysiologist: Croitoru Dry Weight: 118lbs Bi-V Pacing: 98.2%      Heart Failure questions reviewed, pt asymptomatic.  She said she this past weekend she ate foods at [employer] for 3 days and knew she would have a little fluid.  She has resumed her low salt diet.  Exercising at [facility].    Thoracic impedance slightly abnormal suggesting fluid accumulation the last couple of days but it is due to eating foods high in salt.  Prescribed dosage: Furosemide 40 mg 1.5 tablets (60 mg total) daily  Labs: 08/11/2017 Creatinine 2.32, BUN 52, Potassium 4.9, Sodium 135, EGFR 25.88 (per result note being referred to nephrologist) 02/07/2017 Creatinine 2.05, BUN 33, Potassium 4.1, Sodium 137 10/31/2016 Creatinine 1.96, BUN 39, Potassium 4.2, Sodium 142  Recommendations: No changes.  Reinforced salt restriction.  Encouraged to call for fluid symptoms.  Follow-up plan: ICM clinic phone appointment on 04/12/2018.    Copy of ICM check sent to Dr. Sallyanne Kuster.   3 month ICM trend: 03/12/2018    1 Year ICM trend:       Rosalene Billings, RN 03/13/2018 3:10 PM

## 2018-03-14 LAB — CUP PACEART REMOTE DEVICE CHECK
Battery Voltage: 2.91 V
Brady Statistic AP VP Percent: 59.04 %
Brady Statistic AP VS Percent: 0.16 %
Brady Statistic AS VP Percent: 39.32 %
Brady Statistic AS VS Percent: 1.48 %
Brady Statistic RV Percent Paced: 96.29 %
Date Time Interrogation Session: 20190429062826
HIGH POWER IMPEDANCE MEASURED VALUE: 38 Ohm
HIGH POWER IMPEDANCE MEASURED VALUE: 54 Ohm
Implantable Lead Implant Date: 20030708
Implantable Lead Implant Date: 20070503
Implantable Lead Location: 753858
Implantable Lead Model: 5076
Implantable Pulse Generator Implant Date: 20150915
Lead Channel Impedance Value: 304 Ohm
Lead Channel Impedance Value: 304 Ohm
Lead Channel Impedance Value: 399 Ohm
Lead Channel Pacing Threshold Amplitude: 1.375 V
Lead Channel Pacing Threshold Pulse Width: 0.4 ms
Lead Channel Pacing Threshold Pulse Width: 0.4 ms
Lead Channel Pacing Threshold Pulse Width: 0.4 ms
Lead Channel Sensing Intrinsic Amplitude: 2.75 mV
Lead Channel Sensing Intrinsic Amplitude: 2.75 mV
Lead Channel Setting Pacing Amplitude: 2.5 V
Lead Channel Setting Pacing Amplitude: 3.5 V
Lead Channel Setting Pacing Pulse Width: 0.4 ms
MDC IDC LEAD IMPLANT DT: 20070501
MDC IDC LEAD IMPLANT DT: 20111019
MDC IDC LEAD LOCATION: 753859
MDC IDC LEAD LOCATION: 753860
MDC IDC LEAD LOCATION: 753860
MDC IDC MSMT BATTERY REMAINING LONGEVITY: 16 mo
MDC IDC MSMT LEADCHNL LV IMPEDANCE VALUE: 4047 Ohm
MDC IDC MSMT LEADCHNL LV IMPEDANCE VALUE: 4047 Ohm
MDC IDC MSMT LEADCHNL RA PACING THRESHOLD AMPLITUDE: 0.625 V
MDC IDC MSMT LEADCHNL RA SENSING INTR AMPL: 3.125 mV
MDC IDC MSMT LEADCHNL RA SENSING INTR AMPL: 3.125 mV
MDC IDC MSMT LEADCHNL RV IMPEDANCE VALUE: 418 Ohm
MDC IDC MSMT LEADCHNL RV PACING THRESHOLD AMPLITUDE: 1.5 V
MDC IDC SET LEADCHNL RA PACING AMPLITUDE: 2 V
MDC IDC SET LEADCHNL RV PACING PULSEWIDTH: 0.4 ms
MDC IDC SET LEADCHNL RV SENSING SENSITIVITY: 0.3 mV
MDC IDC STAT BRADY RA PERCENT PACED: 57.4 %

## 2018-03-21 ENCOUNTER — Encounter: Payer: Self-pay | Admitting: Internal Medicine

## 2018-03-21 LAB — HM MAMMOGRAPHY

## 2018-03-22 ENCOUNTER — Ambulatory Visit (INDEPENDENT_AMBULATORY_CARE_PROVIDER_SITE_OTHER)
Admission: RE | Admit: 2018-03-22 | Discharge: 2018-03-22 | Disposition: A | Discharge status: Home / Self Care | Payer: Medicare Other | Source: Ambulatory Visit | Attending: Internal Medicine | Admitting: Internal Medicine

## 2018-03-22 ENCOUNTER — Encounter: Payer: Self-pay | Admitting: Internal Medicine

## 2018-03-22 ENCOUNTER — Ambulatory Visit (INDEPENDENT_AMBULATORY_CARE_PROVIDER_SITE_OTHER): Payer: Medicare Other | Admitting: Internal Medicine

## 2018-03-22 VITALS — BP 98/60 | HR 74 | Temp 98.9°F | Ht 64.5 in | Wt 117.8 lb

## 2018-03-22 DIAGNOSIS — E1122 Type 2 diabetes mellitus with diabetic chronic kidney disease: Secondary | ICD-10-CM

## 2018-03-22 DIAGNOSIS — I10 Essential (primary) hypertension: Secondary | ICD-10-CM | POA: Diagnosis not present

## 2018-03-22 DIAGNOSIS — R05 Cough: Secondary | ICD-10-CM

## 2018-03-22 DIAGNOSIS — R059 Cough, unspecified: Secondary | ICD-10-CM

## 2018-03-22 DIAGNOSIS — N183 Chronic kidney disease, stage 3 unspecified: Secondary | ICD-10-CM

## 2018-03-22 MED ORDER — CEFTRIAXONE SODIUM 1 G IJ SOLR
1.0000 g | Freq: Once | INTRAMUSCULAR | Status: AC
  Administered 2018-03-22: 1 g via INTRAMUSCULAR

## 2018-03-22 MED ORDER — AMOXICILLIN-POT CLAVULANATE 875-125 MG PO TABS: 1.0000 | ORAL_TABLET | Freq: Two times a day (BID) | ORAL | 0 refills | Status: DC

## 2018-03-22 MED ORDER — HYDROCODONE-HOMATROPINE 5-1.5 MG/5ML PO SYRP: 5.0000 mL | ORAL_SOLUTION | Freq: Four times a day (QID) | ORAL | 0 refills | Status: AC | PRN

## 2018-03-22 NOTE — Assessment & Plan Note (Signed)
Lab Results  Component Value Date   HGBA1C 6.7 (H) 02/08/2018  stable overall by history and exam, recent data reviewed with pt, and pt to continue medical treatment as before,  to f/u any worsening symptoms or concerns

## 2018-03-22 NOTE — Assessment & Plan Note (Addendum)
Mild to mod, c/w bronchitis vs pna, for cxr, for rocephin 1 gm IM,  for antibx course and cough med prn,  to f/u any worsening symptoms or concerns

## 2018-03-22 NOTE — Patient Instructions (Signed)
You had the antibiotic shot today  Please take all new medication as prescribed - the pill antibiotic, and cough medicine if needed  Please continue all other medications as before, and refills have been done if requested.  Please have the pharmacy call with any other refills you may need.  Please keep your appointments with your specialists as you may have planned  Please go to the XRAY Department in the Basement (go straight as you get off the elevator) for the x-ray testing  You will be contacted by phone if any changes need to be made immediately.  Otherwise, you will receive a letter about your results with an explanation, but please check with MyChart first.  Please remember to sign up for MyChart if you have not done so, as this will be important to you in the future with finding out test results, communicating by private email, and scheduling acute appointments online when needed.

## 2018-03-22 NOTE — Progress Notes (Signed)
Subjective:    Patient ID: Priscilla Anderson, female    DOB: 07/01/1934, 84 y.o.   MRN: [id number]  HPI  Here with persistent acute prod greenish cough x 2 wks, just feels terrible and ill, weak and malaise, but Pt denies chest pain, increased sob or doe, wheezing, orthopnea, PND, increased LE swelling, palpitations, dizziness or syncope.  Pt denies new neurological symptoms such as new headache, or facial or extremity weakness or numbness   Pt denies polydipsia, polyuria  Past Medical History:  Diagnosis Date  . Adenomatous colon polyp 2004  . AICD (automatic cardioverter/defibrillator) present   . CAD (coronary artery disease)   . CHF (congestive heart failure) ([place])   . Hyperlipidemia   . Hypertension   . Nonischemic cardiomyopathy ([place]) 09/22/2011  . Osteopenia   . Polyp of larynx   . Type II diabetes mellitus ([place])    Past Surgical History:  Procedure Laterality Date  . BIV ICD GENERTAOR CHANGE OUT N/A 07/29/2014   Procedure: BIV ICD GENERTAOR CHANGE OUT;  Surgeon: Sanda Klein, MD;  Location: [facility] CATH [facility];  Service: Cardiovascular;  Laterality: N/A;  . BREAST CYST EXCISION Left 1980  . CARDIAC CATHETERIZATION  07/28/2005   noncritical CAD  . CARDIAC CATHETERIZATION     "I've had several"  . CARDIAC DEFIBRILLATOR PLACEMENT  05/21/2002   s/p re-do pacemaker/defibrillator October 2011- [place].  . COLONOSCOPY    . IMPLANTABLE CARDIOVERTER DEFIBRILLATOR GENERATOR CHANGE  09/01/2010   Medtronic  . MICROLARYNGOSCOPY WITH CO2 LASER AND EXCISION OF VOCAL CORD LESION  08/2002   Archie Endo 03/29/2011  . VAGINAL HYSTERECTOMY      reports that she has never smoked. She has never used smokeless tobacco. She reports that she has current or past drug history. Frequency: 2.00 times per week. She reports that she does not drink alcohol. family history includes Arthritis in her father and mother; Diabetes in her other; Heart disease in her brother; Prostate cancer in her brother. Allergies   Allergen Reactions  . Lanoxin [Digoxin] Nausea Only    Nausea and wgt loss   Current Outpatient Medications on File Prior to Visit  Medication Sig Dispense Refill  . ACCU-CHEK AVIVA PLUS test strip USE TO TEST BLOOD SUGAR TWICE DAILY. 100 each 2  . ACCU-CHEK SOFTCLIX LANCETS lancets USE TO TEST BLOOD SUGAR TWICE DAILY. 100 each 0  . Artificial Tear Solution (SOOTHE XP) SOLN Apply 1 drop to eye daily as needed (dry eyes).    . Ascorbic Acid (VITAMIN C) 500 MG tablet Take 500 mg by mouth daily.      Marland Kitchen aspirin 81 MG tablet Take 81 mg by mouth daily.      . carvedilol (COREG) 12.5 MG tablet TAKE 1 TABLET TWICE A DAY 180 tablet 2  . diazepam (VALIUM) 5 MG tablet Take 1 tablet (5 mg total) by mouth at bedtime as needed for anxiety. 90 tablet 1  . furosemide (LASIX) 40 MG tablet TAKE ONE AND ONE-HALF TABLETS DAILY (CHANGE IN DOSE) 135 tablet 0  . ibandronate (BONIVA) 150 MG tablet TAKE 1 TABLET ONCE A MONTH. 9 tablet 3  . ivabradine (CORLANOR) 5 MG TABS tablet Take 1 tablet (5 mg total) by mouth 2 (two) times daily with a meal. 180 tablet 3  . JANUVIA 100 MG tablet TAKE 1 TABLET DAILY 90 tablet 1  . losartan (COZAAR) 50 MG tablet TAKE 1 TABLET DAILY 90 tablet 3  . metFORMIN (GLUCOPHAGE-XR) 500 MG 24 hr tablet Take  1 tablet (500 mg total) by mouth daily. 90 tablet 3  . pantoprazole (PROTONIX) 40 MG tablet TAKE 1 TABLET TWICE A DAY 180 tablet 1  . saccharomyces boulardii (FLORASTOR) 250 MG capsule Take 1 capsule (250 mg total) by mouth 2 (two) times daily. 60 capsule 1  . Simethicone (GAS RELIEF) 180 MG CAPS Take 180 mg by mouth 2 (two) times daily. Takes 2 after meals and takes 2 at bedtime    . simvastatin (ZOCOR) 20 MG tablet Take 1 tablet (20 mg total) by mouth at bedtime. NEED OV. 90 tablet 1  . spironolactone (ALDACTONE) 25 MG tablet TAKE 1 TABLET DAILY 90 tablet 2   No current facility-administered medications on file prior to visit.    Review of Systems  Constitutional: Negative for  other unusual diaphoresis or sweats HENT: Negative for ear discharge or swelling Eyes: Negative for other worsening visual disturbances Respiratory: Negative for stridor or other swelling  Gastrointestinal: Negative for worsening distension or other blood Genitourinary: Negative for retention or other urinary change Musculoskeletal: Negative for other MSK pain or swelling Skin: Negative for color change or other new lesions Neurological: Negative for worsening tremors and other numbness  Psychiatric/Behavioral: Negative for worsening agitation or other fatigue All other system neg per pt    Objective:   Physical Exam BP 98/60 (BP Location: Left Arm, Patient Position: Sitting, Cuff Size: Normal)   Pulse 74   Temp 98.9 F (37.2 C) (Oral)   Ht 5' 4.5" (1.638 m)   Wt 117 lb 12 oz (53.4 kg)   SpO2 93%   BMI 19.90 kg/m  VS noted, mild to mod ill appearing Constitutional: Pt appears in NAD HENT: Head: NCAT.  Right Ear: External ear normal.  Left Ear: External ear normal.  Eyes: . Pupils are equal, round, and reactive to light. Conjunctivae and EOM are normal Nose: without d/c or deformity Neck: Neck supple. Gross normal ROM Cardiovascular: Normal rate and regular rhythm.   Pulmonary/Chest: Effort normal and breath sounds dcreased without rales or wheezing.  Neurological: Pt is alert. At baseline orientation, motor grossly intact Skin: Skin is warm. No rashes, other new lesions, no LE edema Psychiatric: Pt behavior is normal without agitation  No other exam findings    Assessment & Plan:

## 2018-03-22 NOTE — Assessment & Plan Note (Signed)
stable overall by history and exam, recent data reviewed with pt, and pt to continue medical treatment as before,  to f/u any worsening symptoms or concerns BP Readings from Last 3 Encounters:  03/22/18 98/60  02/08/18 108/68  01/03/18 102/64

## 2018-03-23 ENCOUNTER — Encounter: Payer: Self-pay | Admitting: Internal Medicine

## 2018-03-23 ENCOUNTER — Telehealth: Payer: Self-pay

## 2018-03-27 ENCOUNTER — Other Ambulatory Visit: Payer: Self-pay | Admitting: Internal Medicine

## 2018-03-27 ENCOUNTER — Ambulatory Visit: Payer: Self-pay | Admitting: *Deleted

## 2018-03-27 MED ORDER — SACCHAROMYCES BOULARDII 250 MG PO CAPS: 250.0000 mg | ORAL_CAPSULE | Freq: Two times a day (BID) | ORAL | 1 refills | Status: AC

## 2018-03-27 NOTE — Addendum Note (Signed)
Addended by: Biagio Borg on: 03/27/2018 11:58 AM   Modules accepted: Orders

## 2018-03-27 NOTE — Telephone Encounter (Signed)
I sent new prescription for florastor probiotic  Ok for immodium if mild only  If diarrhea persists or is bad enough to her, we could consider change of antibiotic

## 2018-03-27 NOTE — Telephone Encounter (Signed)
Pt has been informed and expressed understanding.  

## 2018-03-27 NOTE — Telephone Encounter (Signed)
Called in c/o having diarrhea since starting the Augmentin on 03/22/18 that Dr. Jenny Reichmann prescribed.   She is starting to feel weak from  All the diarrhea.  She is drinking a lot of water.   She mentioned she knew how important it was to drink water to prevent dehydration.  Can Dr. Jenny Reichmann call in something different?  I have routed a high priority note to Dr. Jenny Reichmann making him aware of her situation.  I let her know someone from the office will be in contact with her.     Reason for Disposition . Caller has URGENT medication question about med that PCP prescribed and triager unable to answer question    Diarrhea since starting the Augmentin.  Answer Assessment - Initial Assessment Questions 1. SYMPTOMS: "Do you have any symptoms?"     I'm on Augmentin and I've been having diarrhea since I started it.   I even had an accident during the night.   Even if I eat I have to go have diarrhea.  I'm feeling weak.  I'm drinking water.     I'm still coughing some but the cough is much better.     I'm using the cough which is helping.   I'm having yellow mucus at times.   2. SEVERITY: If symptoms are present, ask "Are they mild, moderate or severe?"     "Terrible diarrhea"   With the antibiotic.  Protocols used: MEDICATION QUESTION CALL-A-AH

## 2018-04-08 ENCOUNTER — Other Ambulatory Visit: Payer: Self-pay | Admitting: Internal Medicine

## 2018-04-12 ENCOUNTER — Ambulatory Visit (INDEPENDENT_AMBULATORY_CARE_PROVIDER_SITE_OTHER): Payer: Medicare Other

## 2018-04-12 DIAGNOSIS — I5022 Chronic systolic (congestive) heart failure: Secondary | ICD-10-CM | POA: Diagnosis not present

## 2018-04-12 DIAGNOSIS — Z95810 Presence of automatic (implantable) cardiac defibrillator: Secondary | ICD-10-CM

## 2018-04-12 NOTE — Progress Notes (Signed)
EPIC Encounter for ICM Monitoring  Patient Name: Priscilla Anderson is a 84 y.o. female Date: 04/12/2018 Primary Care Physican: Biagio Borg, MD Primary Cardiologist:[place] Electrophysiologist: Croitoru Dry Weight: 116.4lbs Bi-V Pacing: 96.8%       Heart Failure questions reviewed, pt asymptomatic.   Thoracic impedance normal.  Prescribed dosage: Furosemide 40 mg 1.5 tablets (60 mg total) daily  Labs: 08/11/2017 Creatinine 2.32, BUN 52, Potassium 4.9, Sodium 135, EGFR 25.88 (per result note being referred to nephrologist) 02/07/2017 Creatinine 2.05, BUN 33, Potassium 4.1, Sodium 137 10/31/2016 Creatinine 1.96, BUN 39, Potassium 4.2, Sodium 142  Recommendations: No changes.   Encouraged to call for fluid symptoms.  Follow-up plan: ICM clinic phone appointment on 05/14/2018.  Office appointment scheduled 05/21/2018 with Dr. Aundra Dubin.  Copy of ICM check sent to Dr. Sallyanne Kuster.   3 month ICM trend: 04/12/2018    1 Year ICM trend:       Rosalene Billings, RN 04/12/2018 11:09 AM

## 2018-04-13 NOTE — Progress Notes (Signed)
Thank you MCr 

## 2018-04-20 NOTE — Telephone Encounter (Signed)
error 

## 2018-05-03 ENCOUNTER — Other Ambulatory Visit: Payer: Self-pay | Admitting: Internal Medicine

## 2018-05-03 DIAGNOSIS — I10 Essential (primary) hypertension: Secondary | ICD-10-CM

## 2018-05-03 DIAGNOSIS — I5022 Chronic systolic (congestive) heart failure: Secondary | ICD-10-CM

## 2018-05-11 ENCOUNTER — Ambulatory Visit (INDEPENDENT_AMBULATORY_CARE_PROVIDER_SITE_OTHER): Payer: Medicare Other | Admitting: *Deleted

## 2018-05-11 VITALS — BP 115/60 | HR 69 | Resp 18 | Ht 65.0 in | Wt 117.0 lb

## 2018-05-11 DIAGNOSIS — Z0000 Encounter for general adult medical examination without abnormal findings: Secondary | ICD-10-CM | POA: Diagnosis not present

## 2018-05-11 NOTE — Progress Notes (Addendum)
Subjective:   Priscilla Anderson is a 84 y.o. female who presents for Medicare Annual (Subsequent) preventive examination.  Review of Systems:  No ROS.  Medicare Wellness Visit. Additional risk factors are reflected in the social history.  Cardiac Risk Factors include: advanced age (>72mn, >>31women);diabetes mellitus;dyslipidemia Sleep patterns: feels rested on waking, gets up 1 times nightly to void and sleeps 7-8 hours nightly.    Home Safety/Smoke Alarms: Feels safe in home. Smoke alarms in place.  Living environment; residence and Firearm Safety: 1-story house/ trailer, no firearms, Lives alone, no needs for DME, good support system. Seat Belt Safety/Bike Helmet: Wears seat belt.     Objective:     Vitals: BP 115/60   Pulse 69   Resp 18   Ht 5' 5"  (1.651 m)   Wt 117 lb (53.1 kg)   SpO2 100%   BMI 19.47 kg/m   Body mass index is 19.47 kg/m.  Advanced Directives 05/11/2018 05/09/2017 05/05/2017 11/11/2014  Does Patient Have a Medical Advance Directive? Yes Yes Yes No  Type of AParamedicof [place]Living will Living will;Healthcare Power of [place]alcones HeightsLiving will -  Copy of [place] Chart? No - copy requested - No - copy requested -  Would patient like information on creating a medical advance directive? - - - No - patient declined information    Tobacco Social History   Tobacco Use  Smoking Status Never Smoker  Smokeless Tobacco Never Used     Counseling given: Not Answered  Past Medical History:  Diagnosis Date  . Adenomatous colon polyp 2004  . AICD (automatic cardioverter/defibrillator) present   . CAD (coronary artery disease)   . CHF (congestive heart failure) ([place]   . Hyperlipidemia   . Hypertension   . [place]ischemic cardiomyopathy ([place] 09/22/2011  . Osteopenia   . Polyp of larynx   . Type II diabetes mellitus ([place]    Past Surgical History:  Procedure Laterality Date  . BIV  ICD GENERTAOR CHANGE OUT N/A 07/29/2014   Procedure: BIV ICD GENERTAOR CHANGE OUT;  Surgeon: MSanda Klein MD;  [facility];  Service: Cardiovascular;  Laterality: N/A;  . BREAST CYST EXCISION Left 1980  . CARDIAC CATHETERIZATION  07/28/2005   noncritical CAD  . CARDIAC CATHETERIZATION     "I've had several"  . CARDIAC DEFIBRILLATOR PLACEMENT  05/21/2002   s/p re-do pacemaker/defibrillator October 2011- [place]  . COLONOSCOPY    . IMPLANTABLE CARDIOVERTER DEFIBRILLATOR GENERATOR CHANGE  09/01/2010   Medtronic  . MICROLARYNGOSCOPY WITH CO2 LASER AND EXCISION OF VOCAL CORD LESION  08/2002   /Archie Endo5/15/2012  . VAGINAL HYSTERECTOMY     Family History  Problem Relation Age of Onset  . Arthritis Mother   . Arthritis Father   . Heart disease Brother        CABG  . Prostate cancer Brother        Prostate problem with up and down PSA  . Diabetes Other        1st degree   Social History   Socioeconomic History  . Marital status: Widowed    Spouse name: Not on file  . Number of children: 2  . Years of education: Not on file  . Highest education level: Not on file  Occupational History  . Occupation: retired LInvestment banker, corporate report clerk    Employer: RETIRED  Social Needs  . Financial resource strain: Not hard at all  .  Food insecurity:    Worry: Never true    Inability: Never true  . Transportation needs:    Medical: No    Non-medical: No  Tobacco Use  . Smoking status: Never Smoker  . Smokeless tobacco: Never Used  Substance and Sexual Activity  . Alcohol use: No    Alcohol/week: 0.0 oz  . Drug use: Not Currently    Frequency: 2.0 times per week  . Sexual activity: Never  Lifestyle  . Physical activity:    Days per week: 5 days    Minutes per session: 40 min  . Stress: Not at all  Relationships  . Social connections:    Talks on phone: More than three times a week    Gets together: More than three times a week    Attends religious service: More  than 4 times per year    Active member of club or organization: Yes    Attends meetings of clubs or organizations: More than 4 times per year    Relationship status: Widowed  Other Topics Concern  . Not on file  Social History Narrative   Husband now with Multiple Myeloma- see Dr. Juanita Craver    Outpatient Encounter Medications as of 05/11/2018  Medication Sig  . ACCU-CHEK AVIVA PLUS test strip USE TO TEST BLOOD SUGAR TWICE DAILY.  Marland Kitchen ACCU-CHEK SOFTCLIX LANCETS lancets USE TO TEST BLOOD SUGAR TWICE DAILY.  Marland Kitchen Artificial Tear Solution (SOOTHE XP) SOLN Apply 1 drop to eye daily as needed (dry eyes).  . Ascorbic Acid (VITAMIN C) 500 MG tablet Take 500 mg by mouth daily.    Marland Kitchen aspirin 81 MG tablet Take 81 mg by mouth daily.    . carvedilol (COREG) 12.5 MG tablet TAKE 1 TABLET TWICE A DAY  . diazepam (VALIUM) 5 MG tablet Take 1 tablet (5 mg total) by mouth at bedtime as needed for anxiety.  . furosemide (LASIX) 40 MG tablet TAKE ONE AND ONE-HALF TABLETS DAILY (CHANGE IN DOSE)  . ibandronate (BONIVA) 150 MG tablet TAKE 1 TABLET ONCE A MONTH.  . ivabradine (CORLANOR) 5 MG TABS tablet Take 1 tablet (5 mg total) by mouth 2 (two) times daily with a meal.  . JANUVIA 100 MG tablet TAKE 1 TABLET DAILY  . losartan (COZAAR) 50 MG tablet TAKE 1 TABLET DAILY  . metFORMIN (GLUCOPHAGE-XR) 500 MG 24 hr tablet Take 1 tablet (500 mg total) by mouth daily.  . pantoprazole (PROTONIX) 40 MG tablet TAKE 1 TABLET TWICE A DAY  . saccharomyces boulardii (FLORASTOR) 250 MG capsule Take 1 capsule (250 mg total) by mouth 2 (two) times daily.  . Simethicone (GAS RELIEF) 180 MG CAPS Take 180 mg by mouth 2 (two) times daily. Takes 2 after meals and takes 2 at bedtime  . simvastatin (ZOCOR) 20 MG tablet Take 1 tablet (20 mg total) by mouth at bedtime. NEED OV.  Marland Kitchen spironolactone (ALDACTONE) 25 MG tablet TAKE 1 TABLET DAILY  . [DISCONTINUED] amoxicillin-clavulanate (AUGMENTIN) 875-125 MG tablet Take 1 tablet by mouth  2 (two) times daily. (Patient not taking: Reported on 05/11/2018)   No facility-administered encounter medications on file as of 05/11/2018.     Activities of Daily Living In your present state of health, do you have any difficulty performing the following activities: 05/11/2018  Hearing? N  Vision? N  Difficulty concentrating or making decisions? N  Walking or climbing stairs? N  Dressing or bathing? N  Doing errands, shopping? N  Preparing Food and eating ? N  Using the Toilet? N  In the past six months, have you accidently leaked urine? N  Do you have problems with loss of bowel control? N  Managing your Medications? N  Managing your Finances? N  Housekeeping or managing your Housekeeping? N  Some recent data might be hidden    Patient Care Team: Biagio Borg, MD as PCP - General Ladene Artist, MD as Consulting Physician (Gastroenterology) Sanda Klein, MD as Consulting Physician (Cardiology)    Assessment:   This is a routine wellness examination for [place]. Physical assessment deferred to PCP.   Exercise Activities and Dietary recommendations Current Exercise Habits: Structured exercise class, Type of exercise: walking;strength training/weights;calisthenics, Time (Minutes): 40, Frequency (Times/Week): 4, Weekly Exercise (Minutes/Week): 160, Intensity: Mild, Exercise limited by: None identified  Diet (meal preparation, eat out, water intake, caffeinated beverages, dairy products, fruits and vegetables): in general, a "healthy" diet  , well balanced   Reviewed heart healthy and diabetic diet, Encouraged patient to increase daily water and fluid intake.  Goals    . Patient Stated     Stay as healthy and as independent as possible. Continue to exercise, eat healthy, stay active in church and socially.        Fall Risk Fall Risk  05/11/2018 02/08/2018 08/11/2017 05/05/2017 02/07/2017  Falls in the past year? No No No Yes No  Number falls in past yr: - - - 1 -  Injury  with Fall? - - - No -  Risk for fall due to : Impaired balance/gait;Impaired mobility - - - -  Follow up - - - Falls prevention discussed -    Depression Screen PHQ 2/9 Scores 05/11/2018 02/08/2018 05/05/2017 02/07/2017  PHQ - 2 Score 0 0 0 0     Cognitive Function MMSE - Mini Mental State Exam 05/11/2018 05/05/2017  Orientation to time 5 5  Orientation to Place 5 5  Registration 3 3  Attention/ Calculation 4 4  Recall 2 2  Language- name 2 objects 2 2  Language- repeat 1 1  Language- follow 3 step command 3 3  Language- read & follow direction 1 1  Write a sentence 1 1  Copy design 1 1  Total score 28 28        Immunization History  Administered Date(s) Administered  . Influenza Split 08/20/2012  . Influenza Whole 08/14/2008  . Influenza, High Dose Seasonal PF 09/03/2013, 08/11/2017  . Influenza-Unspecified 08/25/2014  . Pneumococcal Conjugate-13 03/04/2014  . Pneumococcal Polysaccharide-23 08/16/2004, 04/21/2009  . Td 05/14/1998, 10/20/2008    Screening Tests Health Maintenance  Topic Date Due  . OPHTHALMOLOGY EXAM  05/08/2018  . INFLUENZA VACCINE  06/14/2018  . FOOT EXAM  08/11/2018  . TETANUS/TDAP  10/20/2018  . DEXA SCAN  Completed  . PNA vac Low Risk Adult  Completed      Plan:      Continue doing brain stimulating activities (puzzles, reading, adult coloring books, staying active) to keep memory sharp.   Continue to eat heart healthy diet (full of fruits, vegetables, whole grains, lean protein, water--limit salt, fat, and sugar intake) and increase physical activity as tolerated.  I have personally reviewed and noted the following in the patient's chart:   . Medical and social history . Use of alcohol, tobacco or illicit drugs  . Current medications and supplements . Functional ability and status . Nutritional status . Physical activity . Advanced directives . List of other physicians . Vitals . Screenings to include cognitive, depression,  and  falls . Referrals and appointments  In addition, I have reviewed and discussed with patient certain preventive protocols, quality metrics, and best practice recommendations. A written personalized care plan for preventive services as well as general preventive health recommendations were provided to patient.     Michiel Cowboy, RN  05/11/2018   Medical screening examination/treatment/procedure(s) were performed by non-physician practitioner and as supervising physician I was immediately available for consultation/collaboration. I agree with above. Cathlean Cower, MD

## 2018-05-11 NOTE — Patient Instructions (Addendum)
Continue doing brain stimulating activities (puzzles, reading, adult coloring books, staying active) to keep memory sharp.   Continue to eat heart healthy diet (full of fruits, vegetables, whole grains, lean protein, water--limit salt, fat, and sugar intake) and increase physical activity as tolerated.   Ms. Priscilla Anderson , Thank you for taking time to come for your Medicare Wellness Visit. I appreciate your ongoing commitment to your health goals. Please review the following plan we discussed and let me know if I can assist you in the future.   These are the goals we discussed: Goals    . Patient Stated     Stay as healthy and as independent as possible. Continue to exercise, eat healthy, stay active in church and socially.        This is a list of the screening recommended for you and due dates:  Health Maintenance  Topic Date Due  . Eye exam for diabetics  05/08/2018  . Flu Shot  06/14/2018  . Complete foot exam   08/11/2018  . Tetanus Vaccine  10/20/2018  . DEXA scan (bone density measurement)  Completed  . Pneumonia vaccines  Completed     Health Maintenance, Female Adopting a healthy lifestyle and getting preventive care can go a long way to promote health and wellness. Talk with your health care provider about what schedule of regular examinations is right for you. This is a good chance for you to check in with your provider about disease prevention and staying healthy. In between checkups, there are plenty of things you can do on your own. Experts have done a lot of research about which lifestyle changes and preventive measures are most likely to keep you healthy. Ask your health care provider for more information. Weight and diet Eat a healthy diet  Be sure to include plenty of vegetables, fruits, low-fat dairy products, and lean protein.  Do not eat a lot of foods high in solid fats, added sugars, or salt.  Get regular exercise. This is one of the most important things you  can do for your health. ? Most adults should exercise for at least 150 minutes each week. The exercise should increase your heart rate and make you sweat (moderate-intensity exercise). ? Most adults should also do strengthening exercises at least twice a week. This is in addition to the moderate-intensity exercise.  Maintain a healthy weight  Body mass index (BMI) is a measurement that can be used to identify possible weight problems. It estimates body fat based on height and weight. Your health care provider can help determine your BMI and help you achieve or maintain a healthy weight.  For females 38 years of age and older: ? A BMI below 18.5 is considered underweight. ? A BMI of 18.5 to 24.9 is normal. ? A BMI of 25 to 29.9 is considered overweight. ? A BMI of 30 and above is considered obese.  Watch levels of cholesterol and blood lipids  You should start having your blood tested for lipids and cholesterol at 82 years of age, then have this test every 5 years.  You may need to have your cholesterol levels checked more often if: ? Your lipid or cholesterol levels are high. ? You are older than 82 years of age. ? You are at high risk for heart disease.  Cancer screening Lung Cancer  Lung cancer screening is recommended for adults 47-24 years old who are at high risk for lung cancer because of a history of smoking.  A yearly low-dose CT scan of the lungs is recommended for people who: ? Currently smoke. ? Have quit within the past 15 years. ? Have at least a 30-pack-year history of smoking. A pack year is smoking an average of one pack of cigarettes a day for 1 year.  Yearly screening should continue until it has been 15 years since you quit.  Yearly screening should stop if you develop a health problem that would prevent you from having lung cancer treatment.  Breast Cancer  Practice breast self-awareness. This means understanding how your breasts normally appear and  feel.  It also means doing regular breast self-exams. Let your health care provider know about any changes, no matter how small.  If you are in your 20s or 30s, you should have a clinical breast exam (CBE) by a health care provider every 1-3 years as part of a regular health exam.  If you are 56 or older, have a CBE every year. Also consider having a breast X-ray (mammogram) every year.  If you have a family history of breast cancer, talk to your health care provider about genetic screening.  If you are at high risk for breast cancer, talk to your health care provider about having an MRI and a mammogram every year.  Breast cancer gene (BRCA) assessment is recommended for women who have family members with BRCA-related cancers. BRCA-related cancers include: ? Breast. ? Ovarian. ? Tubal. ? Peritoneal cancers.  Results of the assessment will determine the need for genetic counseling and BRCA1 and BRCA2 testing.  Cervical Cancer Your health care provider may recommend that you be screened regularly for cancer of the pelvic organs (ovaries, uterus, and vagina). This screening involves a pelvic examination, including checking for microscopic changes to the surface of your cervix (Pap test). You may be encouraged to have this screening done every 3 years, beginning at age 69.  For women ages 23-65, health care providers may recommend pelvic exams and Pap testing every 3 years, or they may recommend the Pap and pelvic exam, combined with testing for human papilloma virus (HPV), every 5 years. Some types of HPV increase your risk of cervical cancer. Testing for HPV may also be done on women of any age with unclear Pap test results.  Other health care providers may not recommend any screening for nonpregnant women who are considered low risk for pelvic cancer and who do not have symptoms. Ask your health care provider if a screening pelvic exam is right for you.  If you have had past treatment for  cervical cancer or a condition that could lead to cancer, you need Pap tests and screening for cancer for at least 20 years after your treatment. If Pap tests have been discontinued, your risk factors (such as having a new sexual partner) need to be reassessed to determine if screening should resume. Some women have medical problems that increase the chance of getting cervical cancer. In these cases, your health care provider may recommend more frequent screening and Pap tests.  Colorectal Cancer  This type of cancer can be detected and often prevented.  Routine colorectal cancer screening usually begins at 82 years of age and continues through 82 years of age.  Your health care provider may recommend screening at an earlier age if you have risk factors for colon cancer.  Your health care provider may also recommend using home test kits to check for hidden blood in the stool.  A small camera at the end  of a tube can be used to examine your colon directly (sigmoidoscopy or colonoscopy). This is done to check for the earliest forms of colorectal cancer.  Routine screening usually begins at age 67.  Direct examination of the colon should be repeated every 5-10 years through 82 years of age. However, you may need to be screened more often if early forms of precancerous polyps or small growths are found.  Skin Cancer  Check your skin from head to toe regularly.  Tell your health care provider about any new moles or changes in moles, especially if there is a change in a mole's shape or color.  Also tell your health care provider if you have a mole that is larger than the size of a pencil eraser.  Always use sunscreen. Apply sunscreen liberally and repeatedly throughout the day.  Protect yourself by wearing long sleeves, pants, a wide-brimmed hat, and sunglasses whenever you are outside.  Heart disease, diabetes, and high blood pressure  High blood pressure causes heart disease and increases  the risk of stroke. High blood pressure is more likely to develop in: ? People who have blood pressure in the high end of the normal range (130-139/85-89 mm Hg). ? People who are overweight or obese. ? People who are African American.  If you are 51-36 years of age, have your blood pressure checked every 3-5 years. If you are 38 years of age or older, have your blood pressure checked every year. You should have your blood pressure measured twice-once when you are at a hospital or clinic, and once when you are not at a hospital or clinic. Record the average of the two measurements. To check your blood pressure when you are not at a hospital or clinic, you can use: ? An automated blood pressure machine at a pharmacy. ? A home blood pressure monitor.  If you are between 54 years and 67 years old, ask your health care provider if you should take aspirin to prevent strokes.  Have regular diabetes screenings. This involves taking a blood sample to check your fasting blood sugar level. ? If you are at a normal weight and have a low risk for diabetes, have this test once every three years after 82 years of age. ? If you are overweight and have a high risk for diabetes, consider being tested at a younger age or more often. Preventing infection Hepatitis B  If you have a higher risk for hepatitis B, you should be screened for this virus. You are considered at high risk for hepatitis B if: ? You were born in a country where hepatitis B is common. Ask your health care provider which countries are considered high risk. ? Your parents were born in a high-risk country, and you have not been immunized against hepatitis B (hepatitis B vaccine). ? You have HIV or AIDS. ? You use needles to inject street drugs. ? You live with someone who has hepatitis B. ? You have had sex with someone who has hepatitis B. ? You get hemodialysis treatment. ? You take certain medicines for conditions, including cancer, organ  transplantation, and autoimmune conditions.  Hepatitis C  Blood testing is recommended for: ? Everyone born from [birth year] through 1963. ? Anyone with known risk factors for hepatitis C.  Sexually transmitted infections (STIs)  You should be screened for sexually transmitted infections (STIs) including gonorrhea and chlamydia if: ? You are sexually active and are younger than 82 years of age. ? You are  older than 82 years of age and your health care provider tells you that you are at risk for this type of infection. ? Your sexual activity has changed since you were last screened and you are at an increased risk for chlamydia or gonorrhea. Ask your health care provider if you are at risk.  If you do not have HIV, but are at risk, it may be recommended that you take a prescription medicine daily to prevent HIV infection. This is called pre-exposure prophylaxis (PrEP). You are considered at risk if: ? You are sexually active and do not regularly use condoms or know the HIV status of your partner(s). ? You take drugs by injection. ? You are sexually active with a partner who has HIV.  Talk with your health care provider about whether you are at high risk of being infected with HIV. If you choose to begin PrEP, you should first be tested for HIV. You should then be tested every 3 months for as long as you are taking PrEP. Pregnancy  If you are premenopausal and you may become pregnant, ask your health care provider about preconception counseling.  If you may become pregnant, take 400 to 800 micrograms (mcg) of folic acid every day.  If you want to prevent pregnancy, talk to your health care provider about birth control (contraception). Osteoporosis and menopause  Osteoporosis is a disease in which the bones lose minerals and strength with aging. This can result in serious bone fractures. Your risk for osteoporosis can be identified using a bone density scan.  If you are 43 years of age or  older, or if you are at risk for osteoporosis and fractures, ask your health care provider if you should be screened.  Ask your health care provider whether you should take a calcium or vitamin D supplement to lower your risk for osteoporosis.  Menopause may have certain physical symptoms and risks.  Hormone replacement therapy may reduce some of these symptoms and risks. Talk to your health care provider about whether hormone replacement therapy is right for you. Follow these instructions at home:  Schedule regular health, dental, and eye exams.  Stay current with your immunizations.  Do not use any tobacco products including cigarettes, chewing tobacco, or electronic cigarettes.  If you are pregnant, do not drink alcohol.  If you are breastfeeding, limit how much and how often you drink alcohol.  Limit alcohol intake to no more than 1 drink per day for nonpregnant women. One drink equals 12 ounces of beer, 5 ounces of wine, or 1 ounces of hard liquor.  Do not use street drugs.  Do not share needles.  Ask your health care provider for help if you need support or information about quitting drugs.  Tell your health care provider if you often feel depressed.  Tell your health care provider if you have ever been abused or do not feel safe at home. This information is not intended to replace advice given to you by your health care provider. Make sure you discuss any questions you have with your health care provider. Document Released: 05/16/2011 Document Revised: 04/07/2016 Document Reviewed: 08/04/2015 Elsevier Interactive Patient Education  [employer].   It is important to avoid accidents which may result in broken bones.  Here are a few ideas on how to make your home safer so you will be less likely to trip or fall.  1. Use nonskid mats or non slip strips in your shower or tub, on  your bathroom floor and around sinks.  If you know that you have spilled water, wipe it  up! 2. In the bathroom, it is important to have properly installed grab bars on the walls or on the edge of the tub.  Towel racks are NOT strong enough for you to hold onto or to pull on for support. 3. Stairs and hallways should have enough light.  Add lamps or night lights if you need ore light. 4. It is good to have handrails on both sides of the stairs if possible.  Always fix broken handrails right away. 5. It is important to see the edges of steps.  Paint the edges of outdoor steps white so you can see them better.  Put colored tape on the edge of inside steps. 6. Throw-rugs are dangerous because they can slide.  Removing the rugs is the best idea, but if they must stay, add adhesive carpet tape to prevent slipping. 7. Do not keep things on stairs or in the halls.  Remove small furniture that blocks the halls as it may cause you to trip.  Keep telephone and electrical cords out of the way where you walk. 8. Always were sturdy, rubber-soled shoes for good support.  Never wear just socks, especially on the stairs.  Socks may cause you to slip or fall.  Do not wear full-length housecoats as you can easily trip on the bottom.  9. Place the things you use the most on the shelves that are the easiest to reach.  If you use a stepstool, make sure it is in good condition.  If you feel unsteady, DO NOT climb, ask for help. 10. If a health professional advises you to use a cane or walker, do not be ashamed.  These items can keep you from falling and breaking your bones.

## 2018-05-14 ENCOUNTER — Ambulatory Visit (INDEPENDENT_AMBULATORY_CARE_PROVIDER_SITE_OTHER): Payer: Medicare Other

## 2018-05-14 ENCOUNTER — Ambulatory Visit (INDEPENDENT_AMBULATORY_CARE_PROVIDER_SITE_OTHER): Payer: Medicare Other | Admitting: Ophthalmology

## 2018-05-14 DIAGNOSIS — Z95810 Presence of automatic (implantable) cardiac defibrillator: Secondary | ICD-10-CM | POA: Diagnosis not present

## 2018-05-14 DIAGNOSIS — H43813 Vitreous degeneration, bilateral: Secondary | ICD-10-CM | POA: Diagnosis not present

## 2018-05-14 DIAGNOSIS — I5022 Chronic systolic (congestive) heart failure: Secondary | ICD-10-CM | POA: Diagnosis not present

## 2018-05-14 DIAGNOSIS — I10 Essential (primary) hypertension: Secondary | ICD-10-CM

## 2018-05-14 DIAGNOSIS — H35033 Hypertensive retinopathy, bilateral: Secondary | ICD-10-CM | POA: Diagnosis not present

## 2018-05-14 DIAGNOSIS — E113293 Type 2 diabetes mellitus with mild nonproliferative diabetic retinopathy without macular edema, bilateral: Secondary | ICD-10-CM

## 2018-05-14 DIAGNOSIS — E11319 Type 2 diabetes mellitus with unspecified diabetic retinopathy without macular edema: Secondary | ICD-10-CM

## 2018-05-14 DIAGNOSIS — H2513 Age-related nuclear cataract, bilateral: Secondary | ICD-10-CM | POA: Diagnosis not present

## 2018-05-14 LAB — HM DIABETES EYE EXAM

## 2018-05-14 NOTE — Progress Notes (Signed)
EPIC Encounter for ICM Monitoring  Patient Name: Priscilla Anderson is a 84 y.o. female Date: 05/14/2018 Primary Care Physican: Biagio Borg, MD Primary Cardiologist:[place] Electrophysiologist: Croitoru Dry Weight: 116lbs Bi-V Pacing: 96.9%      Heart Failure questions reviewed, pt asymptomatic.  She said she has been eating at restaurants over the past few days but she did not realize how much salt is in the foods.    Thoracic impedance abnormal suggesting fluid accumulation starting 05/07/2018 but is trending toward baseline.  Prescribed dosage: Furosemide 40 mg 1.5 tablets (60 mg total) daily  Labs: 08/11/2017 Creatinine 2.32, BUN 52, Potassium 4.9, Sodium 135, EGFR 25.88 (per result note being referred to nephrologist) 02/07/2017 Creatinine 2.05, BUN 33, Potassium 4.1, Sodium 137  Recommendations: Advised to limit salt intake and tips on how to eat less salt when eating in restaurants.   Encouraged to call for fluid symptoms.  Follow-up plan: ICM clinic phone appointment on 06/14/2018 since patient has an office appointment scheduled 05/21/2018 with Dr. Aundra Dubin.   Copy of ICM check sent to Dr. Sallyanne Kuster and Dr. Aundra Dubin.   3 month ICM trend: 05/14/2018    1 Year ICM trend:       Rosalene Billings, RN 05/14/2018 4:23 PM

## 2018-05-16 NOTE — Progress Notes (Signed)
Thank you MCr 

## 2018-05-16 NOTE — Progress Notes (Signed)
Returned call to patient as requested by voice mail asking if watermelon would be counted as a fluid. Advised even though watermelon has a lot of water, it is usually too much salt from food.  She stated she was just checking in case the water melon was making her retain fluid.  She stated she is feeling fine.  She thanked me for returning the call.

## 2018-05-18 ENCOUNTER — Other Ambulatory Visit: Payer: Self-pay | Admitting: Internal Medicine

## 2018-05-21 ENCOUNTER — Ambulatory Visit (HOSPITAL_COMMUNITY)
Admission: RE | Admit: 2018-05-21 | Discharge: 2018-05-21 | Disposition: A | Discharge status: Home / Self Care | Payer: Medicare Other | Source: Ambulatory Visit | Attending: Cardiology | Admitting: Cardiology

## 2018-05-21 VITALS — BP 110/68 | HR 72 | Wt 115.4 lb

## 2018-05-21 DIAGNOSIS — N183 Chronic kidney disease, stage 3 (moderate): Secondary | ICD-10-CM | POA: Insufficient documentation

## 2018-05-21 DIAGNOSIS — E785 Hyperlipidemia, unspecified: Secondary | ICD-10-CM | POA: Insufficient documentation

## 2018-05-21 DIAGNOSIS — Z7982 Long term (current) use of aspirin: Secondary | ICD-10-CM | POA: Insufficient documentation

## 2018-05-21 DIAGNOSIS — Z86718 Personal history of other venous thrombosis and embolism: Secondary | ICD-10-CM | POA: Insufficient documentation

## 2018-05-21 DIAGNOSIS — I340 Nonrheumatic mitral (valve) insufficiency: Secondary | ICD-10-CM | POA: Insufficient documentation

## 2018-05-21 DIAGNOSIS — I059 Rheumatic mitral valve disease, unspecified: Secondary | ICD-10-CM

## 2018-05-21 DIAGNOSIS — I5022 Chronic systolic (congestive) heart failure: Secondary | ICD-10-CM | POA: Diagnosis not present

## 2018-05-21 DIAGNOSIS — Z8249 Family history of ischemic heart disease and other diseases of the circulatory system: Secondary | ICD-10-CM | POA: Insufficient documentation

## 2018-05-21 DIAGNOSIS — Z79899 Other long term (current) drug therapy: Secondary | ICD-10-CM | POA: Diagnosis not present

## 2018-05-21 DIAGNOSIS — I2510 Atherosclerotic heart disease of native coronary artery without angina pectoris: Secondary | ICD-10-CM | POA: Insufficient documentation

## 2018-05-21 DIAGNOSIS — I429 Cardiomyopathy, unspecified: Secondary | ICD-10-CM | POA: Diagnosis present

## 2018-05-21 DIAGNOSIS — Z7984 Long term (current) use of oral hypoglycemic drugs: Secondary | ICD-10-CM | POA: Diagnosis not present

## 2018-05-21 LAB — BASIC METABOLIC PANEL
ANION GAP: 11 (ref 5–15)
BUN: 34 mg/dL — ABNORMAL HIGH (ref 8–23)
CHLORIDE: 102 mmol/L (ref 98–111)
CO2: 25 mmol/L (ref 22–32)
CREATININE: 2.29 mg/dL — AB (ref 0.44–1.00)
Calcium: 9.6 mg/dL (ref 8.9–10.3)
GFR calc non Af Amer: 19 mL/min — ABNORMAL LOW (ref >60)
GFR, EST AFRICAN AMERICAN: 22 mL/min — AB (ref >60)
Glucose, Bld: 98 mg/dL (ref 70–99)
POTASSIUM: 4.3 mmol/L (ref 3.5–5.1)
SODIUM: 138 mmol/L (ref 135–145)

## 2018-05-21 NOTE — Progress Notes (Signed)
Patient ID: Priscilla Anderson, female   DOB: 1934/03/14, 84 y.o.   MRN: [id number]   Advanced Heart Failure Clinic Note   Patient ID: Priscilla Anderson, female   DOB: September 03, 1934, 84 y.o.   MRN: [id number] PCP: Dr. Jenny Reichmann Primary Cardiologist: Dr. Sallyanne Kuster HF Cardiologist: Dr. Aundra Dubin  84 y.o. with long history of chronic systolic CHF from nonischemic cardiomyopathy presents for CHF clinic evaluation. She had cardiac cath in 2006 with nonobstructive CAD.  Cardiolites have shown anterior fixed defect but no ischemia (12/13, 9/16).  Most recent echo in 3/19 showed EF 20%, severe LV dilation, moderate MR.  She has a Medtronic CRT-D device.    CPX 10/16 showed a significant CHF limitation.  VE/VCO2 slope was elevated out of proportion to the VO2 max.   She returns for followup of CHF today.  Symptomatically, her main concern recently has been epigastric pain.  It appears that she has had a significant workup of this with PCP and GI, thought to be most likely GERD.  Protonix and simethicone help.  She is not short of breath walking on flat ground.  Weight has trended down.  She goes to the [facility] 5 days/week and walks/takes exercise class.  No orthopnea/PND.  No palpitations.  No lightheadedness or dizziness.  She has been seeing Dr. Hollie Salk with nephrology.   Medtronic device interrogation: No AF or VT.  About 99% BiV pacing.  Fluid index < threshold but heading up.  Impedance has stabilized and is starting to rise again.   Labs (8/16): TSH normal Labs (9/16): K 4.5, creatinine 1.69, BNP 985 Labs (10/16): K 4.2, creatinine 2.06, BNP 808 Labs (09/16/15): K 4.7, creatinine 1.67 => 2, BNP 960, LDL 54, HDL 48 Labs (9/17): K 4.7, creatinine 1.8, LDL 59, HDL 54 Labs (3/19): LDL 64, HDL 54, K 4.5, creatinine 2.21  PMH: 1. CHB: s/p Medtronic CRT-D device.  2. 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  3. CKD: Stage 3.  4. Hyperlipidemia 5. Chronic systolic CHF: Nonischemic cardiomyopathy.  LHC (2006)  with 40-50% LAD stenosis, 30% RCA stenosis.  Cardiolite (12/13) with no ischemia.  Medtronic CRT-D.  Echo (9/16) with EF 20%, severe LV dilation, restrictive diastolic function, moderate-severe MR (suspect functional), normal RV size with moderately decreased systolic function.  Cardiolite (9/16) with EF 16%, anterior scar (same as prior), no ischemia.  CPX (10/16): peak VO2 12 (72% predicted), VE/VCO2 slope 53, RER 1.12, OUES 0.8 => VE/VCO2 slope suggests severe HR limitation out of proportion to peak VO2.  - Echo (3/19): EF 20%, severe LV dilation, moderate MR.  6. Mitral regurgitation: Moderate to severe on 9/16 echo, likely functional. - Echo ((3/19) with moderate MR.   SH: Lives in [place], widow, retired, no smoking, 2 children, no ETOH.   FH: 1 brother with CHF, 1 brother with history of MI.   ROS: All systems reviewed and negative except as per HPI.   Current Outpatient Medications  Medication Sig Dispense Refill  . ACCU-CHEK AVIVA PLUS test strip USE TO TEST BLOOD SUGAR TWICE DAILY. 100 each 2  . ACCU-CHEK SOFTCLIX LANCETS lancets USE TO TEST BLOOD SUGAR TWICE DAILY. 100 each 0  . Artificial Tear Solution (SOOTHE XP) SOLN Apply 1 drop to eye daily as needed (dry eyes).    . Ascorbic Acid (VITAMIN C) 500 MG tablet Take 500 mg by mouth daily.      Marland Kitchen aspirin 81 MG tablet Take 81 mg by mouth daily.      Marland Kitchen  carvedilol (COREG) 12.5 MG tablet TAKE 1 TABLET TWICE A DAY 180 tablet 2  . diazepam (VALIUM) 5 MG tablet Take 1 tablet (5 mg total) by mouth at bedtime as needed for anxiety. 90 tablet 1  . furosemide (LASIX) 40 MG tablet TAKE ONE AND ONE-HALF TABLETS DAILY (CHANGE IN DOSE) 135 tablet 0  . ibandronate (BONIVA) 150 MG tablet TAKE 1 TABLET ONCE A MONTH. 9 tablet 3  . ivabradine (CORLANOR) 5 MG TABS tablet Take 1 tablet (5 mg total) by mouth 2 (two) times daily with a meal. 180 tablet 3  . JANUVIA 100 MG tablet TAKE 1 TABLET DAILY 90 tablet 1  . losartan (COZAAR) 50 MG tablet TAKE 1  TABLET DAILY 90 tablet 3  . metFORMIN (GLUCOPHAGE-XR) 500 MG 24 hr tablet Take 1 tablet (500 mg total) by mouth daily. 90 tablet 3  . pantoprazole (PROTONIX) 40 MG tablet TAKE 1 TABLET TWICE A DAY 180 tablet 1  . saccharomyces boulardii (FLORASTOR) 250 MG capsule Take 1 capsule (250 mg total) by mouth 2 (two) times daily. 60 capsule 1  . Simethicone (GAS RELIEF) 180 MG CAPS Take 180 mg by mouth 2 (two) times daily. Takes 2 after meals and takes 2 at bedtime    . simvastatin (ZOCOR) 20 MG tablet Take 1 tablet (20 mg total) by mouth at bedtime. NEED OV. 90 tablet 1  . spironolactone (ALDACTONE) 25 MG tablet TAKE 1 TABLET DAILY 90 tablet 2   No current facility-administered medications for this encounter.    BP 110/68   Pulse 72   Wt 115 lb 6.4 oz (52.3 kg)   SpO2 96%   BMI 19.20 kg/m    Wt Readings from Last 3 Encounters:  05/21/18 115 lb 6.4 oz (52.3 kg)  05/11/18 117 lb (53.1 kg)  03/22/18 117 lb 12 oz (53.4 kg)   General: NAD Neck: No JVD, no thyromegaly or thyroid nodule.  Lungs: Clear to auscultation bilaterally with normal respiratory effort. CV: Nondisplaced PMI.  Heart regular S1/S2, no S3/S4, 2/6 HSM apex.  No peripheral edema.  No carotid bruit.  Normal pedal pulses.  Abdomen: Soft, nontender, no hepatosplenomegaly, no distention.  Skin: Intact without lesions or rashes.  Neurologic: Alert and oriented x 3.  Psych: Normal affect. Extremities: No clubbing or cyanosis.  HEENT: Normal.   Assessment/Plan: 1. Chronic systolic CHF: Nonischemic cardiomyopathy. EF 20% on last echo severe LV dilation and moderate MR.  Medtronic CRT-D.  NYHA class II symptoms, stable.   She is not volume overloaded by exam.  Optivol suggests recent worsening volume state but she reports dietary indiscretion last week and has been working on cutting back on sodium since then.  Impedance appears to be improving.   - Continue Lasix 40 mg daily, BMET today.  - Continue Corlanor, this has made a big  difference with her symptoms.  - She has not tolerated digoxin due to nausea. - Continue current Coreg, losartan, and spironolactone => I will not increase losartan or transition to [facility] with elevated (but stable) creatinine.  2. CKD Stage III: Stable, seeing nephrology now.  3. Mitral regurgitation: Moderate functional MR on 5/19 echo.  Probably fluctuates with volume state.  4. HLD: Good lipids 3/19.   Followup with me in 6 months if she remains clinically stable.   Loralie Champagne 05/21/2018

## 2018-05-21 NOTE — Patient Instructions (Signed)
Labs drawn today (if we do not call you, then your lab work was stable)   Your physician recommends that you schedule a follow-up appointment in: 6 months with Dr. Aundra Dubin  Please Call an Schedule Appointment

## 2018-05-30 ENCOUNTER — Encounter (HOSPITAL_COMMUNITY): Payer: Self-pay

## 2018-05-30 ENCOUNTER — Emergency Department (HOSPITAL_COMMUNITY)
Admission: EM | Admit: 2018-05-30 | Discharge: 2018-05-31 | Disposition: A | Discharge status: Home / Self Care | Payer: Medicare Other | Source: Home / Self Care | Attending: Emergency Medicine | Admitting: Emergency Medicine

## 2018-05-30 ENCOUNTER — Other Ambulatory Visit: Payer: Self-pay

## 2018-05-30 DIAGNOSIS — K5909 Other constipation: Secondary | ICD-10-CM

## 2018-05-30 DIAGNOSIS — I5022 Chronic systolic (congestive) heart failure: Secondary | ICD-10-CM | POA: Insufficient documentation

## 2018-05-30 DIAGNOSIS — N183 Chronic kidney disease, stage 3 (moderate): Secondary | ICD-10-CM | POA: Insufficient documentation

## 2018-05-30 DIAGNOSIS — R109 Unspecified abdominal pain: Secondary | ICD-10-CM

## 2018-05-30 DIAGNOSIS — N170 Acute kidney failure with tubular necrosis: Secondary | ICD-10-CM | POA: Diagnosis not present

## 2018-05-30 DIAGNOSIS — Z79899 Other long term (current) drug therapy: Secondary | ICD-10-CM | POA: Insufficient documentation

## 2018-05-30 DIAGNOSIS — I130 Hypertensive heart and chronic kidney disease with heart failure and stage 1 through stage 4 chronic kidney disease, or unspecified chronic kidney disease: Secondary | ICD-10-CM

## 2018-05-30 DIAGNOSIS — I2510 Atherosclerotic heart disease of native coronary artery without angina pectoris: Secondary | ICD-10-CM

## 2018-05-30 DIAGNOSIS — N179 Acute kidney failure, unspecified: Secondary | ICD-10-CM | POA: Diagnosis not present

## 2018-05-30 DIAGNOSIS — E119 Type 2 diabetes mellitus without complications: Secondary | ICD-10-CM | POA: Insufficient documentation

## 2018-05-30 LAB — COMPREHENSIVE METABOLIC PANEL
ALBUMIN: 4 g/dL (ref 3.5–5.0)
ALT: 11 U/L (ref 0–44)
AST: 20 U/L (ref 15–41)
Alkaline Phosphatase: 38 U/L (ref 38–126)
Anion gap: 15 (ref 5–15)
BUN: 54 mg/dL — AB (ref 8–23)
CO2: 22 mmol/L (ref 22–32)
CREATININE: 2.46 mg/dL — AB (ref 0.44–1.00)
Calcium: 9.4 mg/dL (ref 8.9–10.3)
Chloride: 100 mmol/L (ref 98–111)
GFR calc Af Amer: 20 mL/min — ABNORMAL LOW (ref >60)
GFR, EST NON AFRICAN AMERICAN: 17 mL/min — AB (ref >60)
GLUCOSE: 268 mg/dL — AB (ref 70–99)
Potassium: 5.1 mmol/L (ref 3.5–5.1)
Sodium: 137 mmol/L (ref 135–145)
Total Bilirubin: 0.7 mg/dL (ref 0.3–1.2)
Total Protein: 6.8 g/dL (ref 6.5–8.1)

## 2018-05-30 LAB — CBC
HCT: 34.2 % — ABNORMAL LOW (ref 36.0–46.0)
Hemoglobin: 10.9 g/dL — ABNORMAL LOW (ref 12.0–15.0)
MCH: 32.2 pg (ref 26.0–34.0)
MCHC: 31.9 g/dL (ref 30.0–36.0)
MCV: 101.2 fL — ABNORMAL HIGH (ref 78.0–100.0)
PLATELETS: 184 10*3/uL (ref 150–400)
RBC: 3.38 MIL/uL — ABNORMAL LOW (ref 3.87–5.11)
RDW: 13.2 % (ref 11.5–15.5)
WBC: 7.9 10*3/uL (ref 4.0–10.5)

## 2018-05-30 LAB — URINALYSIS, ROUTINE W REFLEX MICROSCOPIC
BILIRUBIN URINE: NEGATIVE
GLUCOSE, UA: NEGATIVE mg/dL
HGB URINE DIPSTICK: NEGATIVE
KETONES UR: NEGATIVE mg/dL
LEUKOCYTES UA: NEGATIVE
Nitrite: NEGATIVE
PH: 6 (ref 5.0–8.0)
Protein, ur: NEGATIVE mg/dL
Specific Gravity, Urine: 1.009 (ref 1.005–1.030)

## 2018-05-30 LAB — LIPASE, BLOOD: Lipase: 67 U/L — ABNORMAL HIGH (ref 11–51)

## 2018-05-30 NOTE — ED Notes (Signed)
Results reviewed, no changes in acuity at this time 

## 2018-05-30 NOTE — ED Provider Notes (Signed)
Patient placed in Quick Look pathway, seen and evaluated   Chief Complaint: abdominal pain  HPI:   Diffuse abdominal pain x 2-3 months. Worse after meals. Began today after eating an apple. Saw Dr Fuller Plan who did not recommend EGD due to [facility].  Taking protonix. Daily aspirin. No heavy use of ETOH/NSAIDs.  Last BM this morning. No abd surgeries.   ROS: positive: nausea Negative: dark stools (2 weeks ago, resolved), fevers, chills, vomiting, urinary symptoms. normal.   Physical Exam:   Gen: No distress  Neuro: Awake and Alert  Skin: Warm    Focused Exam: diffuse abdominal tenderness most at epigastrium. No G/R/R. Negative Murphy's and McBurney's. Mild suprapubic tendenress. No CVAT.    Initiation of care has begun. The patient has been counseled on the process, plan, and necessity for staying for the completion/evaluation, and the remainder of the medical screening examination    Arlean Hopping 05/30/18 2010    Hayden Rasmussen, MD 06/01/18 [phone]

## 2018-05-30 NOTE — ED Notes (Signed)
Spoke to patient about delays, discomforts, and wait times.  Patient updated on wait times and discussed pain treatment due to abdominal pain complaints.  Patient verbalized understanding of need to wait for

## 2018-05-30 NOTE — ED Triage Notes (Signed)
Pt endorses abd pain since 1300 but has been having intermittent abd pain x 2 months. Pt went to GI and would not go down with a scope because pt has pacemaker and heart problems. Pt has been taking gas-x extra strength and tums without relief. VSS.

## 2018-05-31 ENCOUNTER — Emergency Department (HOSPITAL_COMMUNITY): Payer: Medicare Other

## 2018-05-31 MED ORDER — GI COCKTAIL ~~LOC~~
30.0000 mL | Freq: Once | ORAL | Status: AC
  Administered 2018-05-31: 30 mL via ORAL
  Filled 2018-05-31: qty 30

## 2018-05-31 MED ORDER — POLYETHYLENE GLYCOL 3350 17 GM/SCOOP PO POWD: ORAL | 0 refills | Status: AC

## 2018-05-31 MED ORDER — DICYCLOMINE HCL 10 MG PO CAPS: 10.0000 mg | ORAL_CAPSULE | Freq: Three times a day (TID) | ORAL | 0 refills | Status: AC | PRN

## 2018-05-31 MED ORDER — ONDANSETRON HCL 4 MG/2ML IJ SOLN: 4.0000 mg | Freq: Once | INTRAMUSCULAR | Status: DC

## 2018-05-31 MED ORDER — ONDANSETRON HCL 4 MG PO TABS
4.0000 mg | ORAL_TABLET | Freq: Once | ORAL | Status: AC
  Administered 2018-05-31: 4 mg via ORAL
  Filled 2018-05-31: qty 1

## 2018-05-31 MED ORDER — DICYCLOMINE HCL 10 MG PO CAPS
10.0000 mg | ORAL_CAPSULE | Freq: Once | ORAL | Status: AC
  Administered 2018-05-31: 10 mg via ORAL
  Filled 2018-05-31: qty 1

## 2018-05-31 NOTE — ED Notes (Signed)
ED Provider at bedside. 

## 2018-05-31 NOTE — ED Notes (Addendum)
Pt reports ongoing problems with her stomach. Pt reports being followed by her PCP and a GI specialist. Pt reports taking her protonix as well as the gas-x and tums that her GI doctor told her to take however she has not had relief today. Pt reports having diverticulitis as well as GERD, pain started today after eating an apple and some lettuce.

## 2018-05-31 NOTE — ED Provider Notes (Signed)
[facility] EMERGENCY DEPARTMENT Provider Note  CSN: [id number] Arrival date & time: 05/30/18 1829  Chief Complaint(s) Abdominal Pain  HPI Priscilla Anderson is a 84 y.o. female   The history is provided by the patient.  Abdominal Pain   This is a recurrent problem. Episode onset: 2-3 months. Episode frequency: intermittent. Progression since onset: fluctutating. The pain is associated with eating. The pain is located in the generalized abdominal region. The quality of the pain is cramping and aching. The pain is moderate. Associated symptoms include nausea. Pertinent negatives include fever, diarrhea, hematochezia, melena, vomiting and dysuria. The symptoms are aggravated by eating. Nothing relieves the symptoms. Her past medical history is significant for GERD. Past medical history comments: diverticulosis.    Past Medical History Past Medical History:  Diagnosis Date  . Adenomatous colon polyp 2004  . AICD (automatic cardioverter/defibrillator) present   . CAD (coronary artery disease)   . CHF (congestive heart failure) ([place])   . Hyperlipidemia   . Hypertension   . Nonischemic cardiomyopathy ([place]) 09/22/2011  . Osteopenia   . Polyp of larynx   . Type II diabetes mellitus [facility])    Patient Active Problem List   Diagnosis Date Noted  . Left ankle pain 05/11/2017  . Cough 12/14/2016  . Dyspepsia 09/21/2016  . Acute upper respiratory infection 02/12/2016  . Abdominal pain 01/07/2016  . Ventricular tachycardia ([place]) 12/16/2015  . Mitral valve disorder 08/17/2015  . Chronic systolic CHF (congestive heart failure) ([place]) 08/17/2015  . Chronic renal insufficiency, stage III (moderate) ([place]) 08/04/2015  . Dyspnea 07/08/2015  . UTI (urinary tract infection) 11/20/2014  . Loss of weight 11/20/2014  . Anginal chest pain at rest [facility]) 11/11/2014  . Type 2 diabetes mellitus with renal manifestations, controlled ([place]) 11/11/2014  . Chest pain at rest 11/11/2014  .  Vaginal disorder 09/02/2014  . Hoarseness 01/14/2014  . Abnormal urine odor 03/25/2012  . Toe pain 12/25/2011  . Moderate mitral regurgitation by prior echocardiogram 09/22/2011  . Nonischemic dilated cardiomyopathy ([place]) 09/22/2011  . Biventricular ICD (implantable cardioverter-defibrillator) in place 09/22/2011  . Recent head trauma 03/21/2011  . Preventative health care 03/17/2011  . Anemia 09/20/2010  . Anxiety state 12/18/2009  . DEPRESSION 12/18/2009  . INSOMNIA-SLEEP DISORDER-UNSPEC 12/18/2009  . FATIGUE 04/21/2008  . Coronary atherosclerosis 09/26/2007  . Allergic rhinitis 09/26/2007  . DIVERTICULOSIS, COLON 09/26/2007  . DVT, HX OF 09/26/2007  . Hyperlipidemia 06/07/2007  . Essential hypertension 06/07/2007  . Chronic systolic congestive heart failure, NYHA class 1 ([place]) 06/07/2007  . Osteoporosis 06/07/2007  . COLONIC POLYPS, HX OF 06/07/2007   Home Medication(s) Prior to Admission medications   Medication Sig Start Date End Date Taking? Authorizing Provider  ACCU-CHEK AVIVA PLUS test strip USE TO TEST BLOOD SUGAR TWICE DAILY. 02/20/18   Biagio Borg, MD  ACCU-CHEK SOFTCLIX LANCETS lancets USE TO TEST BLOOD SUGAR TWICE DAILY. 05/18/18   Biagio Borg, MD  Artificial Tear Solution (SOOTHE XP) SOLN Apply 1 drop to eye daily as needed (dry eyes).    [provider]  Ascorbic Acid (VITAMIN C) 500 MG tablet Take 500 mg by mouth daily.      [provider]  aspirin 81 MG tablet Take 81 mg by mouth daily.      [provider]  carvedilol (COREG) 12.5 MG tablet TAKE 1 TABLET TWICE A DAY 02/09/18   Croitoru, Mihai, MD  diazepam (VALIUM) 5 MG tablet Take 1 tablet (5 mg total) by mouth  at bedtime as needed for anxiety. 02/08/18   Biagio Borg, MD  dicyclomine (BENTYL) 10 MG capsule Take 1 capsule (10 mg total) by mouth 3 (three) times daily between meals as needed for up to 10 days for spasms. 05/31/18 06/10/18  Fatima Blank, MD  furosemide (LASIX) 40  MG tablet TAKE ONE AND ONE-HALF TABLETS DAILY (CHANGE IN DOSE) 05/03/18   Biagio Borg, MD  ibandronate (BONIVA) 150 MG tablet TAKE 1 TABLET ONCE A MONTH. 02/19/18   Biagio Borg, MD  ivabradine (CORLANOR) 5 MG TABS tablet Take 1 tablet (5 mg total) by mouth 2 (two) times daily with a meal. 03/13/18   Larey Dresser, MD  JANUVIA 100 MG tablet TAKE 1 TABLET DAILY 01/20/18   Biagio Borg, MD  losartan (COZAAR) 50 MG tablet TAKE 1 TABLET DAILY 01/23/18   Croitoru, Mihai, MD  metFORMIN (GLUCOPHAGE-XR) 500 MG 24 hr tablet Take 1 tablet (500 mg total) by mouth daily. 02/08/18   Biagio Borg, MD  pantoprazole (PROTONIX) 40 MG tablet TAKE 1 TABLET TWICE A DAY 04/10/18   Biagio Borg, MD  polyethylene glycol powder [facility]) powder Please start taking 1 capful 3 times a day. Slowly cut back as needed until you have normal bowel movements. 05/31/18   Jailen Coward, Grayce Sessions, MD  saccharomyces boulardii (FLORASTOR) 250 MG capsule Take 1 capsule (250 mg total) by mouth 2 (two) times daily. 03/27/18   Biagio Borg, MD  Simethicone (GAS RELIEF) 180 MG CAPS Take 180 mg by mouth 2 (two) times daily. Takes 2 after meals and takes 2 at bedtime    [provider]  simvastatin (ZOCOR) 20 MG tablet Take 1 tablet (20 mg total) by mouth at bedtime. NEED OV. 12/25/17   Croitoru, Mihai, MD  spironolactone (ALDACTONE) 25 MG tablet TAKE 1 TABLET DAILY 10/10/17   Croitoru, Dani Gobble, MD                                                                                                                                    Past Surgical History Past Surgical History:  Procedure Laterality Date  . BIV ICD GENERTAOR CHANGE OUT N/A 07/29/2014   Procedure: BIV ICD GENERTAOR CHANGE OUT;  Surgeon: Sanda Klein, MD;  Location: [place] CATH LAB;  Service: Cardiovascular;  Laterality: N/A;  . BREAST CYST EXCISION Left 1980  . CARDIAC CATHETERIZATION  07/28/2005   noncritical CAD  . CARDIAC CATHETERIZATION     "I've had several"  . CARDIAC  DEFIBRILLATOR PLACEMENT  05/21/2002   s/p re-do pacemaker/defibrillator October 2011- [place].  . COLONOSCOPY    . IMPLANTABLE CARDIOVERTER DEFIBRILLATOR GENERATOR CHANGE  09/01/2010   [employer]  . MICROLARYNGOSCOPY WITH CO2 LASER AND EXCISION OF VOCAL CORD LESION  08/2002   Archie Endo 03/29/2011  . VAGINAL HYSTERECTOMY     Family History Family History  Problem Relation Age of Onset  .  Arthritis Mother   . Arthritis Father   . Heart disease Brother        CABG  . Prostate cancer Brother        Prostate problem with up and down PSA  . Diabetes Other        1st degree    Social History Social History   Tobacco Use  . Smoking status: Never Smoker  . Smokeless tobacco: Never Used  Substance Use Topics  . Alcohol use: No    Alcohol/week: 0.0 oz  . Drug use: Not Currently    Frequency: 2.0 times per week   Allergies Lanoxin [digoxin] and Amoxicillin  Review of Systems Review of Systems  Constitutional: Negative for fever.  Gastrointestinal: Positive for abdominal pain and nausea. Negative for diarrhea, hematochezia, melena and vomiting.  Genitourinary: Negative for dysuria.   All other systems are reviewed and are negative for acute change except as noted in the HPI  Physical Exam Vital Signs  I have reviewed the triage vital signs BP 122/74   Pulse 76   Temp 98 F (36.7 C) (Oral)   Resp 14   Ht 5\' 5"  (1.651 m)   Wt 52.2 kg (115 lb)   SpO2 98%   BMI 19.14 kg/m   Physical Exam  Constitutional: She is oriented to person, place, and time. She appears well-developed and well-nourished. No distress.  HENT:  Head: Normocephalic and atraumatic.  Nose: Nose normal.  Eyes: Pupils are equal, round, and reactive to light. Conjunctivae and EOM are normal. Right eye exhibits no discharge. Left eye exhibits no discharge. No scleral icterus.  Neck: Normal range of motion. Neck supple.  Cardiovascular: Normal rate and regular rhythm. Exam reveals no gallop and no  friction rub.  No murmur heard. Pulmonary/Chest: Effort normal and breath sounds normal. No stridor. No respiratory distress. She has no rales.  Abdominal: Soft. She exhibits no distension. There is no tenderness.  Musculoskeletal: She exhibits no edema or tenderness.  Neurological: She is alert and oriented to person, place, and time.  Skin: Skin is warm and dry. No rash noted. She is not diaphoretic. No erythema.  Psychiatric: She has a normal mood and affect.  Vitals reviewed.   ED Results and Treatments Labs (all labs ordered are listed, but only abnormal results are displayed) Labs Reviewed  LIPASE, BLOOD - Abnormal; Notable for the following components:      Result Value   Lipase 67 (*)    All other components within normal limits  COMPREHENSIVE METABOLIC PANEL - Abnormal; Notable for the following components:   Glucose, Bld 268 (*)    BUN 54 (*)    Creatinine, Ser 2.46 (*)    GFR calc non Af Amer 17 (*)    GFR calc Af Amer 20 (*)    All other components within normal limits  CBC - Abnormal; Notable for the following components:   RBC 3.38 (*)    Hemoglobin 10.9 (*)    HCT 34.2 (*)    MCV 101.2 (*)    All other components within normal limits  URINALYSIS, ROUTINE W REFLEX MICROSCOPIC - Abnormal; Notable for the following components:   Color, Urine STRAW (*)    All other components within normal limits  EKG  EKG Interpretation  Date/Time:    Ventricular Rate:    PR Interval:    QRS Duration:   QT Interval:    QTC Calculation:   R Axis:     Text Interpretation:        Radiology Ct Abdomen Pelvis Wo Contrast  Result Date: 05/31/2018 CLINICAL DATA:  84 y/o  F; intermittent abdominal pain for 2 months. EXAM: CT ABDOMEN AND PELVIS WITHOUT CONTRAST TECHNIQUE: Multidetector CT imaging of the abdomen and pelvis was performed following the standard  protocol without IV contrast. COMPARISON:  None. FINDINGS: Lower chest: Severe cardiomegaly and minor atelectasis in the lung bases. Hepatobiliary: No focal liver abnormality is seen. No gallstones, gallbladder wall thickening, or biliary dilatation. Pancreas: Unremarkable. No pancreatic ductal dilatation or surrounding inflammatory changes. Spleen: Normal in size without focal abnormality. Adrenals/Urinary Tract: Adrenal glands are unremarkable. Kidneys are normal, without renal calculi, focal lesion, or hydronephrosis. Bladder is unremarkable. Stomach/Bowel: Stomach is within normal limits. Appendix not identified, no pericecal inflammation. No evidence of bowel wall thickening, distention, or inflammatory changes. Mild sigmoid diverticulosis, no findings of acute diverticulitis. Moderate volume of stool throughout the colon. Vascular/Lymphatic: Aortic atherosclerosis. No enlarged abdominal or pelvic lymph nodes. Reproductive: Status post hysterectomy. No adnexal masses. Other: No abdominal wall hernia or abnormality. No abdominopelvic ascites. Musculoskeletal: No fracture is seen. IMPRESSION: 1. No acute process identified. 2. Severe cardiomegaly and minor atelectasis in the lung bases. 3. Sigmoid diverticulosis, no findings of acute diverticulitis. 4. Moderate volume of stool throughout the colon, possibly constipation. 5. Aortic atherosclerosis. Electronically Signed   By: Kristine Garbe M.D.   On: 05/31/2018 06:05   Pertinent labs & imaging results that were available during my care of the patient were reviewed by me and considered in my medical decision making (see chart for details).  Medications Ordered in ED Medications  dicyclomine (BENTYL) capsule 10 mg (has no administration in time range)  gi cocktail (Maalox,Lidocaine,Donnatal) (30 mLs Oral Given 05/31/18 0304)  ondansetron (ZOFRAN) tablet 4 mg (4 mg Oral Given 05/31/18 0304)                                                                                                                                     Procedures Procedures  (including critical care time)  Medical Decision Making / ED Course I have reviewed the nursing notes for this encounter and the patient's prior records (if available in EHR or on provided paperwork).    Patient with mild abdominal distention with generalized abdominal discomfort.  Labs are reassuring without leukocytosis.  Renal insufficiency at patient's baseline.  No significant electrolyte derangements.  Patient has hyperglycemia without evidence of DKA.  Lipase mildly elevated but stable from previous.  No evidence of biliary obstruction.  CT of the abdomen obtained revealing no acute intra-abdominal inflammatory/infectious process.  It did reveal evidence of possible Constipation.  Some improvement with GI cocktail. Also given  bentyl. Tolerating oral hydration.  The patient appears reasonably screened and/or stabilized for discharge and I doubt any other medical condition or other [facility] requiring further screening, evaluation, or treatment in the ED at this time prior to discharge.  The patient is safe for discharge with strict return precautions.  Final Clinical Impression(s) / ED Diagnoses Final diagnoses:  Abdominal discomfort  Other constipation   Disposition: Discharge  Condition: Good  I have discussed the results, Dx and Tx plan with the patient and family who expressed understanding and agree(s) with the plan. Discharge instructions discussed at great length. The patient and family was given strict return precautions who verbalized understanding of the instructions. No further questions at time of discharge.    ED Discharge Orders        Ordered    dicyclomine (BENTYL) 10 MG capsule  3 times daily between meals PRN     05/31/18 0634    polyethylene glycol powder (MIRALAX) powder     05/31/18 8588       Follow Up: Biagio Borg, MD [place]  [id number] [phone]  Schedule an appointment as soon as possible for a visit  In 1 to 2 weeks, If symptoms do not improve or  worsen      This chart was dictated using voice recognition software.  Despite best efforts to proofread,  errors can occur which can change the documentation meaning.   Fatima Blank, MD 05/31/18 [phone]

## 2018-06-02 ENCOUNTER — Encounter (HOSPITAL_COMMUNITY): Payer: Self-pay | Admitting: Internal Medicine

## 2018-06-02 ENCOUNTER — Inpatient Hospital Stay (HOSPITAL_COMMUNITY)
Admission: EM | Admit: 2018-06-02 | Discharge: 2018-06-14 | DRG: 682 | Disposition: E | Payer: Medicare Other | Attending: Pulmonary Disease | Admitting: Pulmonary Disease

## 2018-06-02 ENCOUNTER — Emergency Department (HOSPITAL_COMMUNITY): Payer: Medicare Other

## 2018-06-02 ENCOUNTER — Other Ambulatory Visit: Payer: Self-pay

## 2018-06-02 DIAGNOSIS — Z7984 Long term (current) use of oral hypoglycemic drugs: Secondary | ICD-10-CM

## 2018-06-02 DIAGNOSIS — D539 Nutritional anemia, unspecified: Secondary | ICD-10-CM | POA: Diagnosis present

## 2018-06-02 DIAGNOSIS — A419 Sepsis, unspecified organism: Secondary | ICD-10-CM | POA: Diagnosis not present

## 2018-06-02 DIAGNOSIS — R109 Unspecified abdominal pain: Secondary | ICD-10-CM | POA: Diagnosis present

## 2018-06-02 DIAGNOSIS — M8580 Other specified disorders of bone density and structure, unspecified site: Secondary | ICD-10-CM | POA: Diagnosis present

## 2018-06-02 DIAGNOSIS — E119 Type 2 diabetes mellitus without complications: Secondary | ICD-10-CM | POA: Insufficient documentation

## 2018-06-02 DIAGNOSIS — N179 Acute kidney failure, unspecified: Secondary | ICD-10-CM | POA: Diagnosis present

## 2018-06-02 DIAGNOSIS — I340 Nonrheumatic mitral (valve) insufficiency: Secondary | ICD-10-CM | POA: Diagnosis present

## 2018-06-02 DIAGNOSIS — I130 Hypertensive heart and chronic kidney disease with heart failure and stage 1 through stage 4 chronic kidney disease, or unspecified chronic kidney disease: Secondary | ICD-10-CM | POA: Diagnosis present

## 2018-06-02 DIAGNOSIS — M810 Age-related osteoporosis without current pathological fracture: Secondary | ICD-10-CM | POA: Diagnosis present

## 2018-06-02 DIAGNOSIS — E1122 Type 2 diabetes mellitus with diabetic chronic kidney disease: Secondary | ICD-10-CM | POA: Diagnosis not present

## 2018-06-02 DIAGNOSIS — R6521 Severe sepsis with septic shock: Secondary | ICD-10-CM | POA: Diagnosis not present

## 2018-06-02 DIAGNOSIS — N183 Chronic kidney disease, stage 3 (moderate): Secondary | ICD-10-CM | POA: Diagnosis present

## 2018-06-02 DIAGNOSIS — J9601 Acute respiratory failure with hypoxia: Secondary | ICD-10-CM | POA: Diagnosis not present

## 2018-06-02 DIAGNOSIS — Z833 Family history of diabetes mellitus: Secondary | ICD-10-CM

## 2018-06-02 DIAGNOSIS — G9341 Metabolic encephalopathy: Secondary | ICD-10-CM | POA: Diagnosis not present

## 2018-06-02 DIAGNOSIS — Z7983 Long term (current) use of bisphosphonates: Secondary | ICD-10-CM

## 2018-06-02 DIAGNOSIS — E860 Dehydration: Secondary | ICD-10-CM | POA: Diagnosis present

## 2018-06-02 DIAGNOSIS — K631 Perforation of intestine (nontraumatic): Secondary | ICD-10-CM | POA: Diagnosis present

## 2018-06-02 DIAGNOSIS — I10 Essential (primary) hypertension: Secondary | ICD-10-CM | POA: Diagnosis present

## 2018-06-02 DIAGNOSIS — I2510 Atherosclerotic heart disease of native coronary artery without angina pectoris: Secondary | ICD-10-CM | POA: Diagnosis present

## 2018-06-02 DIAGNOSIS — N170 Acute kidney failure with tubular necrosis: Secondary | ICD-10-CM | POA: Diagnosis present

## 2018-06-02 DIAGNOSIS — E785 Hyperlipidemia, unspecified: Secondary | ICD-10-CM | POA: Diagnosis present

## 2018-06-02 DIAGNOSIS — I5022 Chronic systolic (congestive) heart failure: Secondary | ICD-10-CM | POA: Diagnosis not present

## 2018-06-02 DIAGNOSIS — I428 Other cardiomyopathies: Secondary | ICD-10-CM | POA: Diagnosis present

## 2018-06-02 DIAGNOSIS — Z4659 Encounter for fitting and adjustment of other gastrointestinal appliance and device: Secondary | ICD-10-CM

## 2018-06-02 DIAGNOSIS — E1129 Type 2 diabetes mellitus with other diabetic kidney complication: Secondary | ICD-10-CM | POA: Diagnosis present

## 2018-06-02 DIAGNOSIS — I469 Cardiac arrest, cause unspecified: Secondary | ICD-10-CM | POA: Diagnosis not present

## 2018-06-02 DIAGNOSIS — J690 Pneumonitis due to inhalation of food and vomit: Secondary | ICD-10-CM | POA: Diagnosis not present

## 2018-06-02 DIAGNOSIS — I959 Hypotension, unspecified: Secondary | ICD-10-CM | POA: Diagnosis not present

## 2018-06-02 DIAGNOSIS — D649 Anemia, unspecified: Secondary | ICD-10-CM | POA: Diagnosis present

## 2018-06-02 DIAGNOSIS — Z881 Allergy status to other antibiotic agents status: Secondary | ICD-10-CM

## 2018-06-02 DIAGNOSIS — Z888 Allergy status to other drugs, medicaments and biological substances status: Secondary | ICD-10-CM

## 2018-06-02 DIAGNOSIS — K56609 Unspecified intestinal obstruction, unspecified as to partial versus complete obstruction: Secondary | ICD-10-CM | POA: Diagnosis present

## 2018-06-02 DIAGNOSIS — Z79899 Other long term (current) drug therapy: Secondary | ICD-10-CM

## 2018-06-02 DIAGNOSIS — K567 Ileus, unspecified: Secondary | ICD-10-CM

## 2018-06-02 DIAGNOSIS — Z7982 Long term (current) use of aspirin: Secondary | ICD-10-CM

## 2018-06-02 DIAGNOSIS — Z87440 Personal history of urinary (tract) infections: Secondary | ICD-10-CM

## 2018-06-02 DIAGNOSIS — Z978 Presence of other specified devices: Secondary | ICD-10-CM

## 2018-06-02 DIAGNOSIS — Z8042 Family history of malignant neoplasm of prostate: Secondary | ICD-10-CM

## 2018-06-02 DIAGNOSIS — Z8249 Family history of ischemic heart disease and other diseases of the circulatory system: Secondary | ICD-10-CM

## 2018-06-02 DIAGNOSIS — R112 Nausea with vomiting, unspecified: Secondary | ICD-10-CM

## 2018-06-02 DIAGNOSIS — E875 Hyperkalemia: Secondary | ICD-10-CM | POA: Diagnosis present

## 2018-06-02 DIAGNOSIS — E1165 Type 2 diabetes mellitus with hyperglycemia: Secondary | ICD-10-CM | POA: Diagnosis present

## 2018-06-02 DIAGNOSIS — I509 Heart failure, unspecified: Secondary | ICD-10-CM

## 2018-06-02 DIAGNOSIS — Z95810 Presence of automatic (implantable) cardiac defibrillator: Secondary | ICD-10-CM | POA: Diagnosis present

## 2018-06-02 DIAGNOSIS — Z90710 Acquired absence of both cervix and uterus: Secondary | ICD-10-CM

## 2018-06-02 DIAGNOSIS — K5900 Constipation, unspecified: Secondary | ICD-10-CM

## 2018-06-02 HISTORY — DX: Chronic kidney disease, stage 3 unspecified: N18.30

## 2018-06-02 HISTORY — DX: Chronic kidney disease, stage 3 unspecified: N17.9

## 2018-06-02 HISTORY — DX: Chronic kidney disease, stage 3 (moderate): N18.3

## 2018-06-02 HISTORY — DX: Anemia, unspecified: D64.9

## 2018-06-02 LAB — BASIC METABOLIC PANEL
Anion gap: 14 (ref 5–15)
BUN: 88 mg/dL — ABNORMAL HIGH (ref 8–23)
CALCIUM: 10.6 mg/dL — AB (ref 8.9–10.3)
CO2: 26 mmol/L (ref 22–32)
CREATININE: 4.18 mg/dL — AB (ref 0.44–1.00)
Chloride: 93 mmol/L — ABNORMAL LOW (ref 98–111)
GFR, EST AFRICAN AMERICAN: 10 mL/min — AB (ref >60)
GFR, EST NON AFRICAN AMERICAN: 9 mL/min — AB (ref >60)
Glucose, Bld: 160 mg/dL — ABNORMAL HIGH (ref 70–99)
Potassium: 4.9 mmol/L (ref 3.5–5.1)
SODIUM: 133 mmol/L — AB (ref 135–145)

## 2018-06-02 LAB — HEMOGLOBIN A1C
HEMOGLOBIN A1C: 6.8 % — AB (ref 4.8–5.6)
MEAN PLASMA GLUCOSE: 148.46 mg/dL

## 2018-06-02 LAB — COMPREHENSIVE METABOLIC PANEL
ALBUMIN: 4.2 g/dL (ref 3.5–5.0)
ALK PHOS: 41 U/L (ref 38–126)
ALT: 13 U/L (ref 0–44)
ANION GAP: 19 — AB (ref 5–15)
AST: 24 U/L (ref 15–41)
BILIRUBIN TOTAL: 0.9 mg/dL (ref 0.3–1.2)
BUN: 82 mg/dL — ABNORMAL HIGH (ref 8–23)
CHLORIDE: 88 mmol/L — AB (ref 98–111)
CO2: 26 mmol/L (ref 22–32)
Calcium: 10.9 mg/dL — ABNORMAL HIGH (ref 8.9–10.3)
Creatinine, Ser: 3.95 mg/dL — ABNORMAL HIGH (ref 0.44–1.00)
GFR calc non Af Amer: 10 mL/min — ABNORMAL LOW (ref >60)
GFR, EST AFRICAN AMERICAN: 11 mL/min — AB (ref >60)
Glucose, Bld: 245 mg/dL — ABNORMAL HIGH (ref 70–99)
POTASSIUM: 5.2 mmol/L — AB (ref 3.5–5.1)
SODIUM: 133 mmol/L — AB (ref 135–145)
TOTAL PROTEIN: 7 g/dL (ref 6.5–8.1)

## 2018-06-02 LAB — LIPASE, BLOOD: Lipase: 33 U/L (ref 11–51)

## 2018-06-02 LAB — CBC
HCT: 41.6 % (ref 36.0–46.0)
Hemoglobin: 13.6 g/dL (ref 12.0–15.0)
MCH: 32.1 pg (ref 26.0–34.0)
MCHC: 32.7 g/dL (ref 30.0–36.0)
MCV: 98.1 fL (ref 78.0–100.0)
Platelets: 237 10*3/uL (ref 150–400)
RBC: 4.24 MIL/uL (ref 3.87–5.11)
RDW: 13.5 % (ref 11.5–15.5)
WBC: 11.4 10*3/uL — ABNORMAL HIGH (ref 4.0–10.5)

## 2018-06-02 LAB — GLUCOSE, CAPILLARY
GLUCOSE-CAPILLARY: 142 mg/dL — AB (ref 70–99)
GLUCOSE-CAPILLARY: 198 mg/dL — AB (ref 70–99)
Glucose-Capillary: 201 mg/dL — ABNORMAL HIGH (ref 70–99)

## 2018-06-02 MED ORDER — SACCHAROMYCES BOULARDII 250 MG PO CAPS
250.0000 mg | ORAL_CAPSULE | Freq: Two times a day (BID) | ORAL | Status: DC
  Administered 2018-06-02 – 2018-06-03 (×3): 250 mg via ORAL
  Filled 2018-06-02 (×3): qty 1

## 2018-06-02 MED ORDER — ACETAMINOPHEN 650 MG RE SUPP: 650.0000 mg | Freq: Three times a day (TID) | RECTAL | Status: DC | PRN

## 2018-06-02 MED ORDER — IVABRADINE HCL 5 MG PO TABS
5.0000 mg | ORAL_TABLET | Freq: Two times a day (BID) | ORAL | Status: DC
  Administered 2018-06-02 – 2018-06-03 (×3): 5 mg via ORAL
  Filled 2018-06-02 (×4): qty 1

## 2018-06-02 MED ORDER — SORBITOL 70 % SOLN
960.0000 mL | TOPICAL_OIL | Freq: Once | ORAL | Status: AC
  Administered 2018-06-02: 960 mL via RECTAL
  Filled 2018-06-02: qty 473

## 2018-06-02 MED ORDER — ONDANSETRON HCL 4 MG/2ML IJ SOLN: 4.0000 mg | Freq: Four times a day (QID) | INTRAMUSCULAR | Status: DC | PRN

## 2018-06-02 MED ORDER — INSULIN ASPART 100 UNIT/ML ~~LOC~~ SOLN: 6.0000 [IU] | Freq: Once | SUBCUTANEOUS | Status: DC

## 2018-06-02 MED ORDER — METOCLOPRAMIDE HCL 5 MG/ML IJ SOLN
5.0000 mg | Freq: Four times a day (QID) | INTRAMUSCULAR | Status: DC
  Administered 2018-06-02 – 2018-06-04 (×8): 5 mg via INTRAVENOUS
  Filled 2018-06-02 (×8): qty 2

## 2018-06-02 MED ORDER — CARVEDILOL 12.5 MG PO TABS: 12.5000 mg | ORAL_TABLET | Freq: Two times a day (BID) | ORAL | Status: DC

## 2018-06-02 MED ORDER — CARVEDILOL 12.5 MG PO TABS
12.5000 mg | ORAL_TABLET | Freq: Two times a day (BID) | ORAL | Status: DC
  Filled 2018-06-02 (×2): qty 1

## 2018-06-02 MED ORDER — HEPARIN SODIUM (PORCINE) 5000 UNIT/ML IJ SOLN
5000.0000 [IU] | Freq: Three times a day (TID) | INTRAMUSCULAR | Status: DC
  Administered 2018-06-02 – 2018-06-04 (×6): 5000 [IU] via SUBCUTANEOUS
  Filled 2018-06-02 (×5): qty 1

## 2018-06-02 MED ORDER — DIAZEPAM 5 MG PO TABS
5.0000 mg | ORAL_TABLET | Freq: Once | ORAL | Status: AC
  Administered 2018-06-02: 5 mg via ORAL
  Filled 2018-06-02: qty 1

## 2018-06-02 MED ORDER — INSULIN ASPART 100 UNIT/ML ~~LOC~~ SOLN
0.0000 [IU] | Freq: Three times a day (TID) | SUBCUTANEOUS | Status: DC
  Administered 2018-06-02: 1 [IU] via SUBCUTANEOUS
  Administered 2018-06-02: 3 [IU] via SUBCUTANEOUS
  Administered 2018-06-03 (×2): 2 [IU] via SUBCUTANEOUS

## 2018-06-02 MED ORDER — ACETAMINOPHEN 325 MG PO TABS: 650.0000 mg | ORAL_TABLET | Freq: Three times a day (TID) | ORAL | Status: DC | PRN

## 2018-06-02 MED ORDER — SODIUM CHLORIDE 0.9 % IV SOLN
Freq: Once | INTRAVENOUS | Status: AC
  Administered 2018-06-02: 09:00:00 via INTRAVENOUS

## 2018-06-02 MED ORDER — SODIUM CHLORIDE 0.9 % IV SOLN
INTRAVENOUS | Status: AC
  Administered 2018-06-02: 19:00:00 via INTRAVENOUS

## 2018-06-02 MED ORDER — LOSARTAN POTASSIUM 50 MG PO TABS: 50.0000 mg | ORAL_TABLET | Freq: Every day | ORAL | Status: DC

## 2018-06-02 MED ORDER — ONDANSETRON HCL 4 MG PO TABS: 4.0000 mg | ORAL_TABLET | Freq: Four times a day (QID) | ORAL | Status: DC | PRN

## 2018-06-02 MED ORDER — INSULIN ASPART 100 UNIT/ML ~~LOC~~ SOLN: 0.0000 [IU] | Freq: Every day | SUBCUTANEOUS | Status: DC

## 2018-06-02 MED ORDER — ONDANSETRON HCL 4 MG/2ML IJ SOLN
4.0000 mg | Freq: Once | INTRAMUSCULAR | Status: AC
  Administered 2018-06-02: 4 mg via INTRAVENOUS
  Filled 2018-06-02: qty 2

## 2018-06-02 NOTE — ED Provider Notes (Signed)
[facility] EMERGENCY DEPARTMENT Provider Note   CSN: [id number] Arrival date & time: 05/25/2018  0730     History   Chief Complaint Chief Complaint  Patient presents with  . Abdominal Pain    HPI Priscilla Anderson is a 84 y.o. female with a medical history of nonischemic cardiomyopathy, CHF (most recent ECHO in 01/2018 showed EF 20%), HTN, CAD, AICD, and DMII who presents with vomiting and generalized abdominal pain that began last night. She was evaluated in the ED on 7/17 for generalized abdominal pain without vomiting. At that time CT showed constipation and she was treated with a GI cocktail and sent home with bentyl and miralax. She reports that she had no abdominal pain between discharge from the ED and last night. She took both miralax and an OTC miralax which resulted in one bowel movement. She states that she moves her bowels 1-2 times per day. She states that her current abdominal pain is very mild compared to the pain that brought her to the ED two days ago. She endorses weakness, abdominal bloating, and loss of appetite with 4 episodes of non-bloody, non-bilious vomiting since last night. She has not been tolerating PO since the vomiting began. Of note, she has a months-long history of abdominal discomfort for which she has seen GI about. She has been treated with pantoprazole and tums for suspected GERD and gastritis. GI is avoiding EGD for further evaluation due to her ICD and low EF.   Past Medical History:  Diagnosis Date  . Acute renal failure superimposed on stage 3 chronic kidney disease ([place])   . Adenomatous colon polyp 2004  . AICD (automatic cardioverter/defibrillator) present   . Anemia   . CAD (coronary artery disease)   . CHF (congestive heart failure) ([place])   . Hyperlipidemia   . Hypertension   . Nonischemic cardiomyopathy ([place]) 09/22/2011  . Osteopenia   . Polyp of larynx   . Type II diabetes mellitus [facility])     Patient Active Problem List   Diagnosis Date Noted  . Acute renal failure superimposed on stage 3 chronic kidney disease ([place]) 06/01/2018  . Type II diabetes mellitus ([place])   . Left ankle pain 05/11/2017  . Cough 12/14/2016  . Dyspepsia 09/21/2016  . Acute upper respiratory infection 02/12/2016  . Abdominal pain 01/07/2016  . Ventricular tachycardia ([place]) 12/16/2015  . Mitral valve disorder 08/17/2015  . Chronic systolic CHF (congestive heart failure) ([place]) 08/17/2015  . Chronic renal insufficiency, stage III (moderate) ([place]) 08/04/2015  . Dyspnea 07/08/2015  . UTI (urinary tract infection) 11/20/2014  . Loss of weight 11/20/2014  . Anginal chest pain at rest [facility]) 11/11/2014  . Type 2 diabetes mellitus with renal manifestations, controlled ([place]) 11/11/2014  . Chest pain at rest 11/11/2014  . Vaginal disorder 09/02/2014  . Hoarseness 01/14/2014  . Abnormal urine odor 03/25/2012  . Toe pain 12/25/2011  . Moderate mitral regurgitation by prior echocardiogram 09/22/2011  . Nonischemic dilated cardiomyopathy ([place]) 09/22/2011  . Biventricular ICD (implantable cardioverter-defibrillator) in place 09/22/2011  . Recent head trauma 03/21/2011  . Preventative health care 03/17/2011  . Anemia 09/20/2010  . Anxiety state 12/18/2009  . DEPRESSION 12/18/2009  . INSOMNIA-SLEEP DISORDER-UNSPEC 12/18/2009  . FATIGUE 04/21/2008  . Coronary atherosclerosis 09/26/2007  . Allergic rhinitis 09/26/2007  . DIVERTICULOSIS, COLON 09/26/2007  . DVT, HX OF 09/26/2007  . Hyperlipidemia 06/07/2007  . Essential hypertension 06/07/2007  . Chronic systolic congestive heart failure, NYHA class 1 ([place]) 06/07/2007  .  Osteoporosis 06/07/2007  . COLONIC POLYPS, HX OF 06/07/2007    Past Surgical History:  Procedure Laterality Date  . BIV ICD GENERTAOR CHANGE OUT N/A 07/29/2014   Procedure: BIV ICD GENERTAOR CHANGE OUT;  Surgeon: Sanda Klein, MD;  Location: [facility] CATH LAB;  Service: Cardiovascular;  Laterality: N/A;  . BREAST CYST EXCISION  Left 1980  . CARDIAC CATHETERIZATION  07/28/2005   noncritical CAD  . CARDIAC CATHETERIZATION     "I've had several"  . CARDIAC DEFIBRILLATOR PLACEMENT  05/21/2002   s/p re-do pacemaker/defibrillator October 2011- [facility].  . COLONOSCOPY    . IMPLANTABLE CARDIOVERTER DEFIBRILLATOR GENERATOR CHANGE  09/01/2010   [employer]  . MICROLARYNGOSCOPY WITH CO2 LASER AND EXCISION OF VOCAL CORD LESION  08/2002   Archie Endo 03/29/2011  . VAGINAL HYSTERECTOMY       OB History   None      Home Medications    Prior to Admission medications   Medication Sig Start Date End Date Taking? Authorizing Provider  Artificial Tear Solution (SOOTHE XP) SOLN Apply 1 drop to eye daily as needed (dry eyes).   Yes [provider]  Ascorbic Acid (VITAMIN C) 500 MG tablet Take 500 mg by mouth daily.     Yes [provider]  aspirin 81 MG tablet Take 81 mg by mouth daily.     Yes [provider]  calcium carbonate (TUMS - DOSED IN MG ELEMENTAL CALCIUM) 500 MG chewable tablet Chew 2 tablets by mouth daily as needed for indigestion or heartburn.   Yes [provider]  carvedilol (COREG) 12.5 MG tablet TAKE 1 TABLET TWICE A DAY 02/09/18  Yes Croitoru, Mihai, MD  diazepam (VALIUM) 5 MG tablet Take 1 tablet (5 mg total) by mouth at bedtime as needed for anxiety. 02/08/18  Yes Biagio Borg, MD  dicyclomine (BENTYL) 10 MG capsule Take 1 capsule (10 mg total) by mouth 3 (three) times daily between meals as needed for up to 10 days for spasms. 05/31/18 06/10/18 Yes Cardama, Grayce Sessions, MD  furosemide (LASIX) 40 MG tablet TAKE ONE AND ONE-HALF TABLETS DAILY (CHANGE IN DOSE) 05/03/18  Yes Biagio Borg, MD  ibandronate (BONIVA) 150 MG tablet TAKE 1 TABLET ONCE A MONTH. 02/19/18  Yes Biagio Borg, MD  ivabradine (CORLANOR) 5 MG TABS tablet Take 1 tablet (5 mg total) by mouth 2 (two) times daily with a meal. 03/13/18  Yes Larey Dresser, MD  JANUVIA 100 MG tablet TAKE 1 TABLET DAILY 01/20/18   Yes Biagio Borg, MD  losartan (COZAAR) 50 MG tablet TAKE 1 TABLET DAILY 01/23/18  Yes Croitoru, Mihai, MD  metFORMIN (GLUCOPHAGE-XR) 500 MG 24 hr tablet Take 1 tablet (500 mg total) by mouth daily. 02/08/18  Yes Biagio Borg, MD  pantoprazole (PROTONIX) 40 MG tablet TAKE 1 TABLET TWICE A DAY 04/10/18  Yes Biagio Borg, MD  polyethylene glycol powder [facility]) powder Please start taking 1 capful 3 times a day. Slowly cut back as needed until you have normal bowel movements. 05/31/18  Yes Cardama, Grayce Sessions, MD  saccharomyces boulardii (FLORASTOR) 250 MG capsule Take 1 capsule (250 mg total) by mouth 2 (two) times daily. 03/27/18  Yes Biagio Borg, MD  Simethicone (GAS RELIEF) 180 MG CAPS Take 180 mg by mouth 2 (two) times daily. Takes 2 after meals and takes 2 at bed Ultra Strength   Yes [provider]  simvastatin (ZOCOR) 20 MG tablet Take 1 tablet (20 mg total)  by mouth at bedtime. NEED OV. 12/25/17  Yes Croitoru, Mihai, MD  spironolactone (ALDACTONE) 25 MG tablet TAKE 1 TABLET DAILY 10/10/17  Yes Croitoru, Mihai, MD  ACCU-CHEK AVIVA PLUS test strip USE TO TEST BLOOD SUGAR TWICE DAILY. 02/20/18   Biagio Borg, MD  ACCU-CHEK SOFTCLIX LANCETS lancets USE TO TEST BLOOD SUGAR TWICE DAILY. 05/18/18   Biagio Borg, MD    Family History Family History  Problem Relation Age of Onset  . Arthritis Mother   . Arthritis Father   . Heart disease Brother        CABG  . Prostate cancer Brother        Prostate problem with up and down PSA  . Diabetes Other        1st degree    Social History Social History   Tobacco Use  . Smoking status: Never Smoker  . Smokeless tobacco: Never Used  Substance Use Topics  . Alcohol use: No    Alcohol/week: 0.0 oz  . Drug use: Not Currently    Frequency: 2.0 times per week     Allergies   Lanoxin [digoxin] and Amoxicillin   Review of Systems Review of Systems  Constitutional: Negative for chills and fever.  HENT: Negative for rhinorrhea  and sore throat.   Respiratory: Negative for cough and shortness of breath.   Cardiovascular: Negative for chest pain and leg swelling.  Gastrointestinal: Positive for abdominal distention, abdominal pain, constipation, nausea and vomiting. Negative for blood in stool.  Genitourinary: Negative for difficulty urinating and dysuria.  Musculoskeletal: Negative for back pain and neck pain.  Skin: Negative for rash and wound.  Neurological: Positive for weakness. Negative for dizziness and headaches.     Physical Exam Updated Vital Signs BP (!) 83/59 (BP Location: Left Arm)   Pulse 72   Temp 98 F (36.7 C) (Oral)   Resp 18   SpO2 97%   Physical Exam  Constitutional: She is oriented to person, place, and time. She appears well-developed and well-nourished. She does not appear ill.  HENT:  Head: Normocephalic and atraumatic.  Eyes: Pupils are equal, round, and reactive to light. EOM are normal.  Cardiovascular: Normal rate, regular rhythm and intact distal pulses. Exam reveals no gallop and no friction rub.  No murmur heard. Pulmonary/Chest: Effort normal and breath sounds normal. She has no wheezes. She has no rhonchi. She has no rales.  Abdominal: Bowel sounds are normal. She exhibits distension. There is generalized tenderness. There is no rigidity and no guarding.  Neurological: She is alert and oriented to person, place, and time.  Skin: Skin is warm and dry. Capillary refill takes 2 to 3 seconds. No rash noted.  Psychiatric: She has a normal mood and affect. Her behavior is normal.     ED Treatments / Results  Labs (all labs ordered are listed, but only abnormal results are displayed) Labs Reviewed  COMPREHENSIVE METABOLIC PANEL - Abnormal; Notable for the following components:      Result Value   Sodium 133 (*)    Potassium 5.2 (*)    Chloride 88 (*)    Glucose, Bld 245 (*)    BUN 82 (*)    Creatinine, Ser 3.95 (*)    Calcium 10.9 (*)    GFR calc non Af Amer 10 (*)     GFR calc Af Amer 11 (*)    Anion gap 19 (*)    All other components within normal limits  CBC - Abnormal;  Notable for the following components:   WBC 11.4 (*)    All other components within normal limits  HEMOGLOBIN A1C - Abnormal; Notable for the following components:   Hgb A1c MFr Bld 6.8 (*)    All other components within normal limits  LIPASE, BLOOD  URINALYSIS, ROUTINE W REFLEX MICROSCOPIC  BASIC METABOLIC PANEL    EKG None  Radiology Dg Abdomen 1 View  Result Date: 06/03/2018 CLINICAL DATA:  Abdominal pain and nausea with vomiting 3 days. Decreased appetite. EXAM: ABDOMEN - 1 VIEW COMPARISON:  CT abdomen/pelvis 05/31/2018 FINDINGS: Examination demonstrates several air and contrast filled mildly dilated small bowel loops measuring up to 4 cm in diameter over the mid and right abdomen. Air and stool present throughout the colon. No mass or mass effect. No free peritoneal air. Remainder the exam is unremarkable. IMPRESSION: Findings which may be due to small bowel ileus versus early/partial small bowel obstruction. Electronically Signed   By: Marin Olp M.D.   On: 06/03/2018 09:04    Procedures Procedures (including critical care time)  Medications Ordered in ED Medications  sorbitol, milk of mag, mineral oil, glycerin (SMOG) enema (has no administration in time range)  insulin aspart (novoLOG) injection 6 Units (has no administration in time range)  heparin injection 5,000 Units (has no administration in time range)  0.9 %  sodium chloride infusion ( Intravenous IV [employer] 05/22/2018 1037)  acetaminophen (TYLENOL) tablet 650 mg (has no administration in time range)    Or  acetaminophen (TYLENOL) suppository 650 mg (has no administration in time range)  ondansetron (ZOFRAN) tablet 4 mg (has no administration in time range)    Or  ondansetron (ZOFRAN) injection 4 mg (has no administration in time range)  insulin aspart (novoLOG) injection 0-9 Units (has no  administration in time range)  insulin aspart (novoLOG) injection 0-5 Units (has no administration in time range)  metoCLOPramide (REGLAN) injection 5 mg (has no administration in time range)  ivabradine (CORLANOR) tablet 5 mg (has no administration in time range)  saccharomyces boulardii (FLORASTOR) capsule 250 mg (has no administration in time range)  carvedilol (COREG) tablet 12.5 mg (has no administration in time range)  0.9 %  sodium chloride infusion ( Intravenous Transfusing/Transfer 06/12/2018 1014)  ondansetron (ZOFRAN) injection 4 mg (4 mg Intravenous Given 05/28/2018 0904)     Initial Impression / Assessment and Plan / ED Course  I have reviewed the triage vital signs and the nursing notes.  Pertinent labs & imaging results that were available during my care of the patient were reviewed by me and considered in my medical decision making (see chart for details).  Priscilla Anderson is a 84 y.o. female with a medical history of nonischemic cardiomyopathy, CHF (most recent ECHO in 01/2018 showed EF 20%), HTN, CAD, AICD, and DMII who presents with vomiting and generalized abdominal pain that began last night. She was diagnosed with constipation on her most recent visit to the ED on 7/17 and had one bowel movement since discharge using miralax and an enema. Upon arrival to the ED, pt is afebrile but hypotensive at 83/58. BP recheck was 92/60. Pt reports that her blood pressure runs low "usually in the 80s." Physical exam was significant for abdominal distension and generalized abdominal tenderness. Labs were significant for mild leukocytosis to 11.4, BUN 82 and Cr 3.95 (54 and 2.46, respectively on 05/30/18), and negative lipase. KUB showed mildly dilated small bowel loops with air and stool present throughout the colon suspicious for  small bowel ileus vs. early/partial small bowel obstruction. Because the patient has been passing stool, I suspect ongoing constipation rather than SBO as the cause of  patient's abdominal pain and vomiting. NS at 125 cc/hr given for hypotension in setting of emesis. Zofran given for nausea and vomiting. Patient's worsening kidney function requires admission for acute renal failure and likely enema. Case discussed with hospitalist who will admit.  Final Clinical Impressions(s) / ED Diagnoses   Final diagnoses:  Nausea and vomiting, intractability of vomiting not specified, unspecified vomiting type  Constipation, unspecified constipation type  AKI (acute kidney injury) [facility])    ED Discharge Orders    None       Kylei Purington, Andree Elk, MD 05/20/2018 1115    Isla Pence, MD 05/18/2018 1317

## 2018-06-02 NOTE — ED Triage Notes (Signed)
Patient complaining of abdominal pain and vomiting for "a while", seen here on Wednesday for same. Reports generalized weakness since yesterday. Patient alert, oriented, and in no apparent distress at this time.

## 2018-06-02 NOTE — ED Notes (Signed)
Got patient into a gown on the monitor did ekg shown to er doctor patient is resting with family at bedside and call bell in reach

## 2018-06-02 NOTE — Progress Notes (Signed)
New Admission Note: Patient admitted from [facility] ED  Arrival Method: via stretcher Mental Orientation: Alert and oriented x 4 Telemetry: Placed on telemetry per order Assessment: Completed Skin: Intact IV: R AC Pain: denies Tubes: None Safety Measures: Safety Fall Prevention Plan has been discussed  Admission: To be completed 5 [place] Orientation: Patient has been orientated to the room, unit and staff.   Family: Brother at the bedside  Orders to be reviewed and implemented. Will continue to monitor the patient. Call light has been placed within reach and bed alarm has been activated.   Mady Gemma, BSN, RN-BC Phone: [phone]

## 2018-06-02 NOTE — H&P (Signed)
History and Physical    SIRIYAH AMBROSIUS RVU:[id number] DOB: 12-Mar-1934 DOA: 05/20/2018  PCP: Biagio Borg, MD Patient coming from:   Chief Complaint: abdominal pain/nausea/vomiting  HPI: Priscilla Anderson is a delightful 84 y.o. female with medical history significant for chronic systolic heart failure from nonischemic cardiomyopathy, nonobstructive CAD with a cath in 2006,AICD hypertension, diabetes,chronic kidney disease stage III, presents to the emergency department with the chief complaint of abdominal pain. Initial evaluation reveals acute renal failure superimposed on chronic kidney disease stage III, hyperkalemia, hyperglycemia, ongoing constipation. [facility] are asked to admit.  Information is obtained from the patient and the chart. She states she was in the emergency department 3 days ago for abdominal pain but she was not vomiting at that time. She reports she had a CT scan which showed constipation. She was treated for this. She reports she's been having bowel movements 1-2 times a day ever since and was doing well until yesterday when she developed abdominal bloating with pain again. She reports the pain mostly on the lower quadrants described as sharp but not as bad as 3 days ago.. Associated symptoms include nausea with vomiting. She's been unable to tolerate by mouth's. She denies chest pain palpitation shortness of breath diaphoresis. She denies headache dizziness syncope or near-syncope. He denies dysuria hematuria frequency or urgency.   ED Course: in the emergency department she's afebrile with a very soft blood pressure. She's not hypoxic. She is nontoxic appearing. She is given IV fluids and an enema  Review of Systems: As per HPI otherwise all other systems reviewed and are negative.   Ambulatory Status: ambulates independently. Is independent with ADLs. Lives alone still drives attends exercise classes 4 days a week at the [facility]  Past Medical History:  Diagnosis  Date  . Acute renal failure superimposed on stage 3 chronic kidney disease ([place])   . Adenomatous colon polyp 2004  . AICD (automatic cardioverter/defibrillator) present   . Anemia   . CAD (coronary artery disease)   . CHF (congestive heart failure) ([place])   . Hyperlipidemia   . Hypertension   . Nonischemic cardiomyopathy ([place]) 09/22/2011  . Osteopenia   . Polyp of larynx   . Type II diabetes mellitus ([place])     Past Surgical History:  Procedure Laterality Date  . BIV ICD GENERTAOR CHANGE OUT N/A 07/29/2014   Procedure: BIV ICD GENERTAOR CHANGE OUT;  Surgeon: Sanda Klein, MD;  Location: [facility];  Service: Cardiovascular;  Laterality: N/A;  . BREAST CYST EXCISION Left 1980  . CARDIAC CATHETERIZATION  07/28/2005   noncritical CAD  . CARDIAC CATHETERIZATION     "I've had several"  . CARDIAC DEFIBRILLATOR PLACEMENT  05/21/2002   s/p re-do pacemaker/defibrillator October 2011- [facility].  . COLONOSCOPY    . IMPLANTABLE CARDIOVERTER DEFIBRILLATOR GENERATOR CHANGE  09/01/2010   Medtronic  . MICROLARYNGOSCOPY WITH CO2 LASER AND EXCISION OF VOCAL CORD LESION  08/2002   Archie Endo 03/29/2011  . VAGINAL HYSTERECTOMY      Social History   Socioeconomic History  . Marital status: Widowed    Spouse name: Not on file  . Number of children: 2  . Years of education: Not on file  . Highest education level: Not on file  Occupational History  . Occupation: retired Investment banker, corporate. report clerk    Employer: RETIRED  Social Needs  . Financial resource strain: Not hard at all  . Food insecurity:    Worry: Never true  Inability: Never true  . Transportation needs:    Medical: No    Non-medical: No  Tobacco Use  . Smoking status: Never Smoker  . Smokeless tobacco: Never Used  Substance and Sexual Activity  . Alcohol use: No    Alcohol/week: 0.0 oz  . Drug use: Not Currently    Frequency: 2.0 times per week  . Sexual activity: Never  Lifestyle  . Physical activity:    Days per  week: 5 days    Minutes per session: 40 min  . Stress: Not at all  Relationships  . Social connections:    Talks on phone: More than three times a week    Gets together: More than three times a week    Attends religious service: More than 4 times per year    Active member of club or organization: Yes    Attends meetings of clubs or organizations: More than 4 times per year    Relationship status: Widowed  . Intimate partner violence:    Fear of current or ex partner: Not on file    Emotionally abused: Not on file    Physically abused: Not on file    Forced sexual activity: Not on file  Other Topics Concern  . Not on file  Social History Narrative   Husband now with Multiple Myeloma- see Dr. Juanita Craver    Allergies  Allergen Reactions  . Lanoxin [Digoxin] Nausea Only    Nausea and wgt loss  . Amoxicillin Diarrhea    Severe diarrhea     Family History  Problem Relation Age of Onset  . Arthritis Mother   . Arthritis Father   . Heart disease Brother        CABG  . Prostate cancer Brother        Prostate problem with up and down PSA  . Diabetes Other        1st degree    Prior to Admission medications   Medication Sig Start Date End Date Taking? Authorizing Provider  Simethicone (GAS RELIEF) 180 MG CAPS Take 180 mg by mouth 2 (two) times daily. Takes 2 after meals and takes 2 at bedtime   Yes [provider]  [place] test strip USE TO TEST BLOOD SUGAR TWICE DAILY. 02/20/18   Biagio Borg, MD  ACCU-CHEK SOFTCLIX LANCETS lancets USE TO TEST BLOOD SUGAR TWICE DAILY. 05/18/18   Biagio Borg, MD  Artificial Tear Solution (SOOTHE XP) SOLN Apply 1 drop to eye daily as needed (dry eyes).    [provider]  Ascorbic Acid (VITAMIN C) 500 MG tablet Take 500 mg by mouth daily.      [provider]  aspirin 81 MG tablet Take 81 mg by mouth daily.      [provider]  carvedilol (COREG) 12.5 MG tablet TAKE 1 TABLET TWICE A  DAY 02/09/18   Croitoru, Mihai, MD  diazepam (VALIUM) 5 MG tablet Take 1 tablet (5 mg total) by mouth at bedtime as needed for anxiety. 02/08/18   Biagio Borg, MD  dicyclomine (BENTYL) 10 MG capsule Take 1 capsule (10 mg total) by mouth 3 (three) times daily between meals as needed for up to 10 days for spasms. 05/31/18 06/10/18  Fatima Blank, MD  furosemide (LASIX) 40 MG tablet TAKE ONE AND ONE-HALF TABLETS DAILY (CHANGE IN DOSE) 05/03/18   Biagio Borg, MD  ibandronate (BONIVA) 150 MG tablet TAKE 1 TABLET ONCE A MONTH. 02/19/18  Biagio Borg, MD  ivabradine [facility]) 5 MG TABS tablet Take 1 tablet (5 mg total) by mouth 2 (two) times daily with a meal. 03/13/18   Larey Dresser, MD  JANUVIA 100 MG tablet TAKE 1 TABLET DAILY 01/20/18   Biagio Borg, MD  losartan (COZAAR) 50 MG tablet TAKE 1 TABLET DAILY 01/23/18   Croitoru, Dani Gobble, MD  metFORMIN (GLUCOPHAGE-XR) 500 MG 24 hr tablet Take 1 tablet (500 mg total) by mouth daily. 02/08/18   Biagio Borg, MD  pantoprazole (PROTONIX) 40 MG tablet TAKE 1 TABLET TWICE A DAY 04/10/18   Biagio Borg, MD  polyethylene glycol powder [facility]) powder Please start taking 1 capful 3 times a day. Slowly cut back as needed until you have normal bowel movements. 05/31/18   Cardama, Grayce Sessions, MD  saccharomyces boulardii (FLORASTOR) 250 MG capsule Take 1 capsule (250 mg total) by mouth 2 (two) times daily. 03/27/18   Biagio Borg, MD  simvastatin (ZOCOR) 20 MG tablet Take 1 tablet (20 mg total) by mouth at bedtime. NEED OV. 12/25/17   Croitoru, Dani Gobble, MD  spironolactone (ALDACTONE) 25 MG tablet TAKE 1 TABLET DAILY 10/10/17   Croitoru, Mihai, MD    Physical Exam: Vitals:   06/05/2018 0736 05/25/2018 0749  BP: (!) 83/58 92/60  Pulse: 80 80  Resp: 16 16  Temp: 98.7 F (37.1 C) (!) 97.5 F (36.4 C)  TempSrc: Oral Oral  SpO2: 96% 98%     General:  Appears calm and comfortable in no acute distress Eyes:  PERRL, EOMI, normal lids, iris ENT:  grossly normal  hearing, lips & tongue, mucous membranes of her mouth are pink but dry Neck:  no LAD, masses or thyromegaly Cardiovascular:  RRR, no m/r/g. No LE edema. Pedal pulses present and palpable Respiratory:  CTA bilaterally, no w/r/r. Normal respiratory effort. Abdomen:  Distended slightly firm very sluggish bowel sounds. Moderate tenderness in lower quadrants to palpation. No guarding Skin:  no rash or induration seen on limited exam Musculoskeletal:  grossly normal tone BUE/BLE, good ROM, no bony abnormality Psychiatric:  grossly normal mood and affect, speech fluent and appropriate, AOx3 Neurologic:  CN 2-12 grossly intact, moves all extremities in coordinated fashion, sensation intact speech clear facial symmetry  Labs on Admission: I have personally reviewed following labs and imaging studies  CBC: Recent Labs  Lab 05/30/18 2015 05/17/2018 0739  WBC 7.9 11.4*  HGB 10.9* 13.6  HCT 34.2* 41.6  MCV 101.2* 98.1  PLT 184 619   Basic Metabolic Panel: Recent Labs  Lab 05/30/18 2015 05/23/2018 0739  NA 137 133*  K 5.1 5.2*  CL 100 88*  CO2 22 26  GLUCOSE 268* 245*  BUN 54* 82*  CREATININE 2.46* 3.95*  CALCIUM 9.4 10.9*   GFR: Estimated Creatinine Clearance: 9 mL/min (A) (by C-G formula based on SCr of 3.95 mg/dL (H)). Liver Function Tests: Recent Labs  Lab 05/30/18 2015 05/23/2018 0739  AST 20 24  ALT 11 13  ALKPHOS 38 41  BILITOT 0.7 0.9  PROT 6.8 7.0  ALBUMIN 4.0 4.2   Recent Labs  Lab 05/30/18 2015 05/14/2018 0739  LIPASE 67* 33   No results for input(s): AMMONIA in the last 168 hours. Coagulation Profile: No results for input(s): INR, PROTIME in the last 168 hours. Cardiac Enzymes: No results for input(s): CKTOTAL, CKMB, CKMBINDEX, TROPONINI in the last 168 hours. BNP (last 3 results) No results for input(s): PROBNP in the last 8760 hours. HbA1C: No  results for input(s): HGBA1C in the last 72 hours. CBG: No results for input(s): GLUCAP in the last 168  hours. Lipid Profile: No results for input(s): CHOL, HDL, LDLCALC, TRIG, CHOLHDL, LDLDIRECT in the last 72 hours. Thyroid Function Tests: No results for input(s): TSH, T4TOTAL, FREET4, T3FREE, THYROIDAB in the last 72 hours. Anemia Panel: No results for input(s): VITAMINB12, FOLATE, FERRITIN, TIBC, IRON, RETICCTPCT in the last 72 hours. Urine analysis:    Component Value Date/Time   COLORURINE STRAW (A) 05/30/2018 2013   APPEARANCEUR CLEAR 05/30/2018 2013   LABSPEC 1.009 05/30/2018 2013   PHURINE 6.0 05/30/2018 2013   GLUCOSEU NEGATIVE 05/30/2018 2013   GLUCOSEU NEGATIVE 01/07/2016 1511   HGBUR NEGATIVE 05/30/2018 2013   HGBUR small 09/26/2007 1417   [place] 05/30/2018 2013   [place] 05/30/2018 2013   PROTEINUR NEGATIVE 05/30/2018 2013   UROBILINOGEN 0.2 01/07/2016 1511   NITRITE NEGATIVE 05/30/2018 2013   LEUKOCYTESUR NEGATIVE 05/30/2018 2013    Creatinine Clearance: Estimated Creatinine Clearance: 9 mL/min (A) (by C-G formula based on SCr of 3.95 mg/dL (H)).  Sepsis Labs: @LABRCNTIP (procalcitonin:4,lacticidven:4) )No results found for this or any previous visit (from the past 240 hour(s)).   Radiological Exams on Admission: Dg Abdomen 1 View  Result Date: 06/03/2018 CLINICAL DATA:  Abdominal pain and nausea with vomiting 3 days. Decreased appetite. EXAM: ABDOMEN - 1 VIEW COMPARISON:  CT abdomen/pelvis 05/31/2018 FINDINGS: Examination demonstrates several air and contrast filled mildly dilated small bowel loops measuring up to 4 cm in diameter over the mid and right abdomen. Air and stool present throughout the colon. No mass or mass effect. No free peritoneal air. Remainder the exam is unremarkable. IMPRESSION: Findings which may be due to small bowel ileus versus early/partial small bowel obstruction. Electronically Signed   By: Marin Olp M.D.   On: 06/08/2018 09:04    EKG:   Assessment/Plan Principal Problem:   Acute renal failure  superimposed on stage 3 chronic kidney disease (HCC) Active Problems:   Chronic systolic CHF (congestive heart failure) (HCC)   Anemia   Essential hypertension   Biventricular ICD (implantable cardioverter-defibrillator) in place   Abdominal pain   #1. Acute renal failure superimposed on stage III chronic kidney disease. Likely related to dehydration in the setting of decreased oral intake as well as lasix and spironolactone. Creatinine 3.95 on admission. Chart review indicates baseline is closer to 2.2. -Admit to telemetry -Very gentle IV fluids -Hold nephrotoxins (lasix and losartan) -Monitor urine output -Recheck in the morning  #2. Abdominal pain/nausea and vomiting. Patient with ongoing constipation. KUB reveals findings possibly due to small bowel L yes versus early/partial obstruction. Of note patient had a CT of the abdomen 2 days ago revealing moderate volume of stool throughout the colon possibly constipation. She received an enema in the emergency department -bowel rest -Scheduled Reglan -intake and output -supportive therapy -Mobilize  #3. Hyperkalemia. Mild. Potassium levels 5.2. EKG without acute changes Likely related to #1 the setting of Aldactone. -See #1 -Hold Aldactone -Recheck this afternoon -Monitor  #4. Chronic systolic heart failure. Most recent echo reveals an EF of 25%. Appears compensated -Monitor closely given the need for gentle IV fluids -Intake and output -Continue home Coreg with parameters,  corlanor -obtain daily weights  #5. Diabetes type 2. Uncontrolled. Serum glucose 245 on admission. Home medications include oral agents only. -Hold oral agents -Obtain a hemoglobin A1c -Sliding scale insulin for optimal control  #6. Hypertension. Blood pressures on the low end of normal.  Patient reports her baseline systolic blood pressures around 90. Home medications include Lasix Coreg losartan Aldactone. -Continue Coreg with parameters -monitor blood  pressure closel -Resume home meds as able     DVT prophylaxis: heparin  Code Status: full  Family Communication: brother at bedside  Disposition Plan: home Consults called: none  Admission status: inpatient    Radene Gunning MD [facility]  If 7PM-7AM, please contact night-coverage [email or web address] Password [facility]  06/01/2018, 9:48 AM

## 2018-06-02 NOTE — ED Notes (Signed)
Patient transported to X-ray 

## 2018-06-03 LAB — COMPREHENSIVE METABOLIC PANEL
ALT: 10 U/L (ref 0–44)
AST: 20 U/L (ref 15–41)
Albumin: 3.6 g/dL (ref 3.5–5.0)
Alkaline Phosphatase: 33 U/L — ABNORMAL LOW (ref 38–126)
Anion gap: 16 — ABNORMAL HIGH (ref 5–15)
BILIRUBIN TOTAL: 1 mg/dL (ref 0.3–1.2)
BUN: 100 mg/dL — ABNORMAL HIGH (ref 8–23)
CO2: 22 mmol/L (ref 22–32)
Calcium: 10.3 mg/dL (ref 8.9–10.3)
Chloride: 94 mmol/L — ABNORMAL LOW (ref 98–111)
Creatinine, Ser: 5.26 mg/dL — ABNORMAL HIGH (ref 0.44–1.00)
GFR calc Af Amer: 8 mL/min — ABNORMAL LOW (ref >60)
GFR calc non Af Amer: 7 mL/min — ABNORMAL LOW (ref >60)
Glucose, Bld: 190 mg/dL — ABNORMAL HIGH (ref 70–99)
Potassium: 5.1 mmol/L (ref 3.5–5.1)
SODIUM: 132 mmol/L — AB (ref 135–145)
TOTAL PROTEIN: 6.7 g/dL (ref 6.5–8.1)

## 2018-06-03 LAB — CBC
HCT: 36.9 % (ref 36.0–46.0)
HEMOGLOBIN: 11.5 g/dL — AB (ref 12.0–15.0)
MCH: 31.5 pg (ref 26.0–34.0)
MCHC: 31.2 g/dL (ref 30.0–36.0)
MCV: 101.1 fL — AB (ref 78.0–100.0)
Platelets: 186 10*3/uL (ref 150–400)
RBC: 3.65 MIL/uL — ABNORMAL LOW (ref 3.87–5.11)
RDW: 13.4 % (ref 11.5–15.5)
WBC: 14.4 10*3/uL — ABNORMAL HIGH (ref 4.0–10.5)

## 2018-06-03 LAB — GLUCOSE, CAPILLARY
GLUCOSE-CAPILLARY: 195 mg/dL — AB (ref 70–99)
GLUCOSE-CAPILLARY: 156 mg/dL — AB (ref 70–99)
Glucose-Capillary: 181 mg/dL — ABNORMAL HIGH (ref 70–99)
Glucose-Capillary: 63 mg/dL — ABNORMAL LOW (ref 70–99)
Glucose-Capillary: 141 mg/dL — ABNORMAL HIGH (ref 70–99)

## 2018-06-03 MED ORDER — BISACODYL 5 MG PO TBEC
5.0000 mg | DELAYED_RELEASE_TABLET | Freq: Every day | ORAL | Status: DC | PRN
  Administered 2018-06-03: 5 mg via ORAL
  Filled 2018-06-03: qty 1

## 2018-06-03 MED ORDER — DEXTROSE 50 % IV SOLN
INTRAVENOUS | Status: AC
  Administered 2018-06-03: 25 mL
  Filled 2018-06-03: qty 50

## 2018-06-03 MED ORDER — SODIUM CHLORIDE 0.45 % IV SOLN
INTRAVENOUS | Status: DC
  Administered 2018-06-03: 13:00:00 via INTRAVENOUS

## 2018-06-03 MED ORDER — MORPHINE SULFATE (PF) 2 MG/ML IV SOLN
1.0000 mg | INTRAVENOUS | Status: DC | PRN
  Administered 2018-06-03: 1 mg via INTRAVENOUS
  Filled 2018-06-03: qty 1

## 2018-06-03 MED ORDER — FAMOTIDINE IN NACL 20-0.9 MG/50ML-% IV SOLN
20.0000 mg | INTRAVENOUS | Status: DC
  Administered 2018-06-03: 20 mg via INTRAVENOUS
  Filled 2018-06-03: qty 50

## 2018-06-03 MED ORDER — DIAZEPAM 5 MG PO TABS
5.0000 mg | ORAL_TABLET | Freq: Once | ORAL | Status: AC
  Administered 2018-06-03: 5 mg via ORAL
  Filled 2018-06-03: qty 1

## 2018-06-03 NOTE — Progress Notes (Signed)
[facility]                                                                              Patient Demographics  Priscilla Anderson, is a 84 y.o. female, DOB - 1934/11/14, IOX:[id number]  Admit date - 06/09/2018   Admitting Physician Lady Deutscher, MD  Outpatient Primary MD for the patient is Biagio Borg, MD  Outpatient specialists:   LOS - 1  days    Chief Complaint  Patient presents with  . Abdominal Pain       Brief summary   84 year old female with a past medical history significant for chronic systolic heart failure nonischemic cardiomyopathy with an ejection fraction of 20%  presenting with abdominal pain, nausea vomiting due ileus with dehydration complicated by acute on chronic kidney injury for which reason she is admitted  .   Assessment & Plan    Principal Problem:   Acute renal failure superimposed on stage 3 chronic kidney disease (HCC) Active Problems:   Anemia   Essential hypertension   Biventricular ICD (implantable cardioverter-defibrillator) in place   Chronic systolic CHF (congestive heart failure) (HCC)   Abdominal pain  #1. Acute renal failure superimposed on stage III chronic kidney disease: Baseline @ to 2.2. Creatinine 3.95 on admission.  Likely related to dehydration in the setting of decreased oral intake as well as lasix and spironolactone.  Gentle IV rehydration  monitor renal function with electrolytes   #2.Ileus: KUB consistent with arthrosis assessment bowel obstruction Hypoactive bowel sounds Bowel rest-clears as tolerated Antiemetics/antinauseants PRN Supportive care Consider Gen Surgery consult if not better     #3. Chronic systolic heart failure: Most recent echo reveals an EF of 25%. Appears compensated Monitor closely given the need for gentle IV fluids -high risk for fluid overload Intake and output Continue home Coreg with parameters,  corlanor daily weights  #4. Diabetes type 2: Uncontrolled.  Serum glucose 245 on admission. Home medications include oral agents only. Hold oral agents hemoglobin A1c 6.8% 05/23/2018 Sliding scale insulin for optimal control  #5. Hypertension:  Home medications include Lasix Coreg losartan Aldactone. monitor blood pressure closel Resume home meds as able     Code Status: full DVT Prophylaxis: heparin  Family Communication: Discussed in detail with the patient, all imaging results, lab results explained to the patient   Disposition Plan: Home  Time Spent in minutes  31 minutes  Procedures:    Consultants:    Antimicrobials:      Medications  Scheduled Meds: . carvedilol  12.5 mg Oral BID WC  . heparin  5,000 Units Subcutaneous Q8H  . insulin aspart  0-5 Units Subcutaneous QHS  . insulin aspart  0-9 Units Subcutaneous TID WC  . insulin aspart  6 Units Subcutaneous Once  . ivabradine  5 mg Oral BID WC  . metoCLOPramide (REGLAN) injection  5 mg Intravenous Q6H  . saccharomyces boulardii  250 mg Oral BID   Continuous Infusions: . sodium chloride    . famotidine (PEPCID) IV     PRN Meds:.morphine injection, ondansetron **OR** ondansetron (ZOFRAN) IV   Antibiotics   Anti-infectives (From admission, onward)  None        Subjective:   Priscilla Anderson was seen and examined today.  Intermittent abd pains, otherwise acute events overnight.  Resting comfortably this late morning.  Objective:   Vitals:   05/14/2018 1710 05/14/2018 2036 06/03/18 0521 06/03/18 1010  BP: (!) 76/54 105/85 (!) 90/55 (!) 86/52  Pulse: 69 71 68 71  Resp: 16 20 20 16   Temp:  97.7 F (36.5 C) 97.7 F (36.5 C) 97.8 F (36.6 C)  TempSrc:  Oral Oral Oral  SpO2: 96% 100% 93% 96%    Intake/Output Summary (Last 24 hours) at 06/03/2018 1240 Last data filed at 06/03/2018 0900 Gross per 24 hour  Intake 999.75 ml  Output 0 ml  Net 999.75 ml     Wt Readings from Last 3 Encounters:  05/30/18 52.2 kg (115 lb)  05/21/18 52.3 kg (115 lb 6.4 oz)   05/11/18 53.1 kg (117 lb)     Exam  General: NAD  HEENT: NCAT,  PERRL,MMM  Neck: SUPPLE, (-) JVD  Cardiovascular: RRR, (-) GALLOP, (-) MURMUR  Respiratory: CTA  Gastrointestinal: SOFT, (+) DISTENSION, BS(+hypoactive), (+ minimal) TENDERNESS  Ext: (-) CYANOSIS, (-) EDEMA  Neuro: A, OX 3  Skin:(-) RASH  Psych:NORMAL AFFECT/MOOD   Data Reviewed:  I have personally reviewed following labs and imaging studies  Micro Results No results found for this or any previous visit (from the past 240 hour(s)).  Radiology Reports Ct Abdomen Pelvis Wo Contrast  Result Date: 05/31/2018 CLINICAL DATA:  84 y/o  F; intermittent abdominal pain for 2 months. EXAM: CT ABDOMEN AND PELVIS WITHOUT CONTRAST TECHNIQUE: Multidetector CT imaging of the abdomen and pelvis was performed following the standard protocol without IV contrast. COMPARISON:  None. FINDINGS: Lower chest: Severe cardiomegaly and minor atelectasis in the lung bases. Hepatobiliary: No focal liver abnormality is seen. No gallstones, gallbladder wall thickening, or biliary dilatation. Pancreas: Unremarkable. No pancreatic ductal dilatation or surrounding inflammatory changes. Spleen: Normal in size without focal abnormality. Adrenals/Urinary Tract: Adrenal glands are unremarkable. Kidneys are normal, without renal calculi, focal lesion, or hydronephrosis. Bladder is unremarkable. Stomach/Bowel: Stomach is within normal limits. Appendix not identified, no pericecal inflammation. No evidence of bowel wall thickening, distention, or inflammatory changes. Mild sigmoid diverticulosis, no findings of acute diverticulitis. Moderate volume of stool throughout the colon. Vascular/Lymphatic: Aortic atherosclerosis. No enlarged abdominal or pelvic lymph nodes. Reproductive: Status post hysterectomy. No adnexal masses. Other: No abdominal wall hernia or abnormality. No abdominopelvic ascites. Musculoskeletal: No fracture is seen. IMPRESSION: 1. No acute  process identified. 2. Severe cardiomegaly and minor atelectasis in the lung bases. 3. Sigmoid diverticulosis, no findings of acute diverticulitis. 4. Moderate volume of stool throughout the colon, possibly constipation. 5. Aortic atherosclerosis. Electronically Signed   By: Kristine Garbe M.D.   On: 05/31/2018 06:05   Dg Abdomen 1 View  Result Date: 05/28/2018 CLINICAL DATA:  Abdominal pain and nausea with vomiting 3 days. Decreased appetite. EXAM: ABDOMEN - 1 VIEW COMPARISON:  CT abdomen/pelvis 05/31/2018 FINDINGS: Examination demonstrates several air and contrast filled mildly dilated small bowel loops measuring up to 4 cm in diameter over the mid and right abdomen. Air and stool present throughout the colon. No mass or mass effect. No free peritoneal air. Remainder the exam is unremarkable. IMPRESSION: Findings which may be due to small bowel ileus versus early/partial small bowel obstruction. Electronically Signed   By: Marin Olp M.D.   On: 06/11/2018 09:04    Lab Data:  CBC: Recent Labs  Lab 05/30/18 2015 06/11/2018 0739 06/03/18 0609  WBC 7.9 11.4* 14.4*  HGB 10.9* 13.6 11.5*  HCT 34.2* 41.6 36.9  MCV 101.2* 98.1 101.1*  PLT 184 237 975   Basic Metabolic Panel: Recent Labs  Lab 05/30/18 2015 05/24/2018 0739 05/25/2018 1607 06/03/18 0609  NA 137 133* 133* 132*  K 5.1 5.2* 4.9 5.1  CL 100 88* 93* 94*  CO2 22 26 26 22   GLUCOSE 268* 245* 160* 190*  BUN 54* 82* 88* 100*  CREATININE 2.46* 3.95* 4.18* 5.26*  CALCIUM 9.4 10.9* 10.6* 10.3   GFR: Estimated Creatinine Clearance: 6.8 mL/min (A) (by C-G formula based on SCr of 5.26 mg/dL (H)). Liver Function Tests: Recent Labs  Lab 05/30/18 2015 06/08/2018 0739 06/03/18 0609  AST 20 24 20   ALT 11 13 10   ALKPHOS 38 41 33*  BILITOT 0.7 0.9 1.0  PROT 6.8 7.0 6.7  ALBUMIN 4.0 4.2 3.6   Recent Labs  Lab 05/30/18 2015 05/31/2018 0739  LIPASE 67* 33   No results for input(s): AMMONIA in the last 168  hours. Coagulation Profile: No results for input(s): INR, PROTIME in the last 168 hours. Cardiac Enzymes: No results for input(s): CKTOTAL, CKMB, CKMBINDEX, TROPONINI in the last 168 hours. BNP (last 3 results) No results for input(s): PROBNP in the last 8760 hours. HbA1C: Recent Labs    05/30/2018 0739  HGBA1C 6.8*   CBG: Recent Labs  Lab 06/08/2018 1047 05/18/2018 1710 06/10/2018 2039 06/03/18 0725 06/03/18 1154  GLUCAP 201* 142* 198* 195* 181*   Lipid Profile: No results for input(s): CHOL, HDL, LDLCALC, TRIG, CHOLHDL, LDLDIRECT in the last 72 hours. Thyroid Function Tests: No results for input(s): TSH, T4TOTAL, FREET4, T3FREE, THYROIDAB in the last 72 hours. Anemia Panel: No results for input(s): VITAMINB12, FOLATE, FERRITIN, TIBC, IRON, RETICCTPCT in the last 72 hours. Urine analysis:    Component Value Date/Time   COLORURINE STRAW (A) 05/30/2018 2013   APPEARANCEUR CLEAR 05/30/2018 2013   LABSPEC 1.009 05/30/2018 2013   PHURINE 6.0 05/30/2018 2013   GLUCOSEU NEGATIVE 05/30/2018 2013   GLUCOSEU NEGATIVE 01/07/2016 1511   HGBUR NEGATIVE 05/30/2018 2013   HGBUR small 09/26/2007 [place] 05/30/2018 2013   [place] 05/30/2018 2013   PROTEINUR NEGATIVE 05/30/2018 2013   UROBILINOGEN 0.2 01/07/2016 1511   NITRITE NEGATIVE 05/30/2018 2013   LEUKOCYTESUR NEGATIVE 05/30/2018 2013     Benito Mccreedy M.D. [facility] 06/03/2018, 12:40 PM  Pager: [phone] Between 7am to 7pm - call Pager - [phone]  After 7pm go to [email or web address] - password [email or web address]  Call night coverage person covering after 7pm

## 2018-06-04 ENCOUNTER — Inpatient Hospital Stay (HOSPITAL_COMMUNITY): Payer: Medicare Other | Admitting: Anesthesiology

## 2018-06-04 ENCOUNTER — Inpatient Hospital Stay (HOSPITAL_COMMUNITY): Payer: Medicare Other

## 2018-06-04 DIAGNOSIS — E1122 Type 2 diabetes mellitus with diabetic chronic kidney disease: Secondary | ICD-10-CM

## 2018-06-04 DIAGNOSIS — N183 Chronic kidney disease, stage 3 (moderate): Secondary | ICD-10-CM

## 2018-06-04 DIAGNOSIS — I5022 Chronic systolic (congestive) heart failure: Secondary | ICD-10-CM

## 2018-06-04 DIAGNOSIS — N179 Acute kidney failure, unspecified: Secondary | ICD-10-CM

## 2018-06-04 DIAGNOSIS — I959 Hypotension, unspecified: Secondary | ICD-10-CM

## 2018-06-04 DIAGNOSIS — I10 Essential (primary) hypertension: Secondary | ICD-10-CM

## 2018-06-04 DIAGNOSIS — K567 Ileus, unspecified: Secondary | ICD-10-CM

## 2018-06-04 DIAGNOSIS — I469 Cardiac arrest, cause unspecified: Secondary | ICD-10-CM

## 2018-06-04 DIAGNOSIS — J9601 Acute respiratory failure with hypoxia: Secondary | ICD-10-CM

## 2018-06-04 LAB — GLUCOSE, CAPILLARY
GLUCOSE-CAPILLARY: 156 mg/dL — AB (ref 70–99)
Glucose-Capillary: 141 mg/dL — ABNORMAL HIGH (ref 70–99)

## 2018-06-04 LAB — COMPREHENSIVE METABOLIC PANEL
ALT: 17 U/L (ref 0–44)
AST: 28 U/L (ref 15–41)
Albumin: 2.9 g/dL — ABNORMAL LOW (ref 3.5–5.0)
Alkaline Phosphatase: 32 U/L — ABNORMAL LOW (ref 38–126)
Anion gap: 15 (ref 5–15)
BILIRUBIN TOTAL: 0.8 mg/dL (ref 0.3–1.2)
BUN: 119 mg/dL — AB (ref 8–23)
CHLORIDE: 99 mmol/L (ref 98–111)
CO2: 18 mmol/L — ABNORMAL LOW (ref 22–32)
CREATININE: 6.13 mg/dL — AB (ref 0.44–1.00)
Calcium: 8.4 mg/dL — ABNORMAL LOW (ref 8.9–10.3)
GFR calc Af Amer: 7 mL/min — ABNORMAL LOW (ref >60)
GFR, EST NON AFRICAN AMERICAN: 6 mL/min — AB (ref >60)
Glucose, Bld: 150 mg/dL — ABNORMAL HIGH (ref 70–99)
Potassium: 5.2 mmol/L — ABNORMAL HIGH (ref 3.5–5.1)
Sodium: 132 mmol/L — ABNORMAL LOW (ref 135–145)
Total Protein: 5.3 g/dL — ABNORMAL LOW (ref 6.5–8.1)

## 2018-06-04 LAB — LACTIC ACID, PLASMA: Lactic Acid, Venous: 1.9 mmol/L (ref 0.5–1.9)

## 2018-06-04 MED ORDER — INSULIN ASPART 100 UNIT/ML ~~LOC~~ SOLN: 0.0000 [IU] | SUBCUTANEOUS | Status: DC

## 2018-06-04 MED ORDER — METRONIDAZOLE IN NACL 5-0.79 MG/ML-% IV SOLN
500.0000 mg | Freq: Three times a day (TID) | INTRAVENOUS | Status: DC
  Filled 2018-06-04: qty 100

## 2018-06-04 MED ORDER — CIPROFLOXACIN IN D5W 400 MG/200ML IV SOLN
400.0000 mg | INTRAVENOUS | Status: DC
  Filled 2018-06-04: qty 200

## 2018-06-04 MED ORDER — SODIUM CHLORIDE 0.9 % IV BOLUS
500.0000 mL | Freq: Once | INTRAVENOUS | Status: AC
  Administered 2018-06-04: 500 mL via INTRAVENOUS

## 2018-06-04 MED ORDER — SODIUM CHLORIDE 0.9 % IV SOLN: INTRAVENOUS | Status: DC

## 2018-06-04 MED ORDER — SODIUM CHLORIDE 0.9 % IV BOLUS: 1000.0000 mL | Freq: Once | INTRAVENOUS | Status: DC

## 2018-06-04 MED ORDER — FENTANYL CITRATE (PF) 100 MCG/2ML IJ SOLN: 50.0000 ug | INTRAMUSCULAR | Status: DC | PRN

## 2018-06-04 MED ORDER — MIDAZOLAM HCL 2 MG/2ML IJ SOLN: 1.0000 mg | INTRAMUSCULAR | Status: DC | PRN

## 2018-06-04 MED ORDER — SODIUM BICARBONATE 8.4 % IV SOLN
INTRAVENOUS | Status: AC
  Filled 2018-06-04: qty 50

## 2018-06-04 MED FILL — Medication: Qty: 1 | Status: AC

## 2018-06-05 NOTE — Progress Notes (Signed)
Late Entry:  Notified on call practitioner per son's request for an order for Valium 5mg  (what the patient takes at home). Pt expressed that she felt restless and she wouldn't be able to sleep without it. Order for Valium received.

## 2018-06-06 ENCOUNTER — Telehealth: Payer: Self-pay | Admitting: Pulmonary Disease

## 2018-06-06 NOTE — Telephone Encounter (Signed)
Received signed death certificate back - called [employer] at 3:30pm to let them it's ready for pick up 06/06/18 LM

## 2018-06-06 NOTE — Telephone Encounter (Signed)
Received death certificate from [facility] for [place] of Priscilla Anderson - sending to Dr. Chesley Mires for signature at [place] office 06/06/18 LM

## 2018-06-14 NOTE — Progress Notes (Addendum)
Late Entry:  Notified on call practitioner about low BP in the 60s. She had been consistently running 80s to 90s for the last 24 hours. Pt's HR pacing in the 70s. She had no c/o pain but states she is "weak as water". Pt advised to stay in bed and to not get up without assistance. Order for 500cc bolus received and carried out.

## 2018-06-14 NOTE — Significant Event (Signed)
Rapid Response Event Note  Overview: Time Called: 0637 Arrival Time: 0645 Event Type: Hypotension  Initial Focused Assessment: Lexine Baton RN called for patient with peristent hypotension with BPs 50s and 60s.  Pt has been having low BPs throughout the night since 2300 on 7/21.  Nikki had previously given NS boluses ordered by Jeannette Corpus APP.  Upon arrival, Ms. Silliman was easily arousable, oriented to self, place, time and situation.  Her only complaint was she was weak.  She denied pain and SOB.  BBS CTA and pt is not in distress.  Her abdomen is distended but soft to palpation.  Weak distal pulses x4.  Skin is warm, dry and cap refill is delayed greater than 3 sec.   Temp 97.5 F, HR 70, BP 57/40, RR 18.  Dr. Rockne Menghini notified via [place].  Interventions: -NS bolus 500cc per X. Blount APP (to make 1250 CC bolus total) -NS bolus 1000cc per Dr. Rockne Menghini -Stat Lactate -Cipro IV, Flagyl IV  Plan of Care (if not transferred): -reassess BP following boluses, may need pressors/escalation of care -Dr. Rockne Menghini stated she was going to see patient.    Madelynn Done

## 2018-06-14 NOTE — Progress Notes (Signed)
Paged Dr. Rockne Menghini pt is in [place]

## 2018-06-14 NOTE — Progress Notes (Signed)
Came to room w/ code blue in progress.  Another RT, and House coverage RN ambu bagging and suctioning airway attempting to clear bile contents from airway.  I was setting up intubation supplies when CRNA came to bedside to intubate.  Large amount if bile contents suctioned orally and from ETT.  CCM MD at bedside- aware.  Per CRNA + [facility], + ETCO2 at 30-33, + EZCap color change (purple to yellow),  ETT secured at 21 at teeth (22 at lip) w/ commercial tube holder. Pt transferred to ICU w/ no apparent complications, ICU RT given report.

## 2018-06-14 NOTE — Progress Notes (Signed)
Called to the bedside at 8:31 a.m, patient in a CODE BLUE situation.  Arrived to find code team at bedside along with Dr. Halford Chessman.  Patient aspirated.  Being bagged on arrival.  Being transferred to the ICU under the care of Dr. Halford Chessman.  Will need to place central line/arterial line. Son updated.  Priscilla Anderson 18-Jun-2018 8:54 AM

## 2018-06-14 NOTE — Progress Notes (Signed)
Patient coded earlier in the day. Some family were here and I visited and prayed with them.  There were many and I was requested to come back later when 2 sons -(also one of their daughters and grandkids )could have time with her and chaplain have prayer.  I went back and was able to spend some time with them celebrating their mother no longer being sick and dealing with kidney issues.  Gathered info for the nurse  For next of kin and regarding the funeral home [employer] in [place]. Conard Novak, Chaplain   06/30/18 1600  Clinical Encounter Type  Visited With Patient and family together  Visit Type Follow-up;Spiritual support;Death  Referral From Nurse  Consult/Referral To Chaplain  Spiritual Encounters  Spiritual Needs Prayer;Emotional

## 2018-06-14 NOTE — Progress Notes (Signed)
Late Entry:  Notified on call practitioner of low BP 1 hour after ordered bolus completed. Repositioned patient with feet elevated while waiting for response. Pt alert and orient x4 but c/o feeling weak.  Repaged practitioner to "please advise" about low BP at approximately 5 am with no response.

## 2018-06-14 NOTE — Anesthesia Procedure Notes (Signed)
Procedure Name: Intubation Date/Time: 03-Jul-2018 8:38 AM Performed by: Renato Shin, CRNA Pre-anesthesia Checklist: Patient identified Patient Re-evaluated:Patient Re-evaluated prior to induction Oxygen Delivery Method: Ambu bag Preoxygenation: Pre-oxygenation with 100% oxygen Induction Type: Cricoid Pressure applied Ventilation: Mask ventilation without difficulty Laryngoscope Size: Miller and 2 Grade View: Grade I Tube type: Subglottic suction tube Tube size: 7.5 mm Number of attempts: 1 Airway Equipment and Method: Stylet Placement Confirmation: ETT inserted through vocal cords under direct vision,  positive ETCO2,  CO2 detector and breath sounds checked- equal and bilateral Secured at: 21 cm Tube secured with: Tape Dental Injury: Teeth and Oropharynx as per pre-operative assessment  Comments: Called to pt room, Code Blue, CPR in progress. RT ventilating pt with ambubag. Pt abdomen distended, large amount of vomit suctioned from pt airway. DL x1, Mil 2, Grade 1 view. Large amount of bile suctioned from airway. AOI, +ETCO, =BBS. CPR resumed. ETT suctioned, + for bile.  OGT placed with ease. Total of 500cc bile in suction container. MD informed pt aspirated bile during intubation.

## 2018-06-14 NOTE — Code Documentation (Signed)
Patient transferred to ICU.  Developed worsening hypotension.  Started on pressors.  Lost pulse again.  Pacer spikes on monitor, but not pulse in spite of repeat CPR and epi.  Not able to resuscitate.  Patient expired at 9:42 am.  Chesley Mires, MD [place] 2018-06-18, 9:43 AM

## 2018-06-14 NOTE — Code Documentation (Signed)
  Patient Name: Priscilla Anderson   MRN: [id number]   Date of Birth/ Sex: 31-Oct-1934 , female      Admission Date: 06/03/2018  Attending Provider: Tonye Royalty, MD  Primary Diagnosis: Acute renal failure superimposed on stage 3 chronic kidney disease [facility])   Indication: Pt was in her usual state of health until this AM, when she was noted to be pulseless. Code blue was subsequently called. At the time of arrival on scene, ACLS protocol was underway.   Technical Description:  - CPR performance duration:  15 minutes  - Was defibrillation or cardioversion used? No   - Was external pacer placed? No  - Was patient intubated pre/post CPR? Yes   Medications Administered: Y = Yes; Blank = No Amiodarone    Atropine    Calcium    Epinephrine  3  Lidocaine    Magnesium    Norepinephrine    Phenylephrine    Sodium bicarbonate  3  Vasopressin     Post CPR evaluation:  - Final Status - Was patient successfully resuscitated ? Yes - What is current rhythm? Sinus - What is current hemodynamic status? guarded  Miscellaneous Information:  - Labs sent, including: CBC. CMP, lactic acid   - Primary team notified?  Yes  - Family Notified? No  - Additional notes/ transfer status: Transfering to ICU. Discussed with ICU physician at bedside.      Jean Rosenthal, MD  2018/06/06, 8:48 AM

## 2018-06-14 NOTE — Code Documentation (Signed)
CODE BLUE NOTE  Patient Name: Priscilla Anderson   MRN: [id number]   Date of Birth/ Sex: Mar 06, 1934 , female      Admission Date: 05/14/2018  Attending Provider: Chesley Mires, MD  Primary Diagnosis: AKI (acute kidney injury) ([place]) [N17.9] Constipation, unspecified constipation type [K59.00] Nausea and vomiting, intractability of vomiting not specified, unspecified vomiting type [R11.2]    Indication: Pt was in her usual state of health until this AM, when she was noted to be PEA. Code blue was subsequently called. At the time of arrival on scene, ACLS protocol was underway.   Technical Description:  - CPR performance duration:  4 minutes  - Was defibrillation or cardioversion used? No   - Was external pacer placed? No  - Was patient intubated pre/post CPR? Yes    Medications Administered: Y = Yes; Blank = No Amiodarone    Atropine    Calcium    Epinephrine  Y  Lidocaine    Magnesium    Norepinephrine    Phenylephrine    Sodium bicarbonate  Y  Vasopressin    Other     Post CPR evaluation:  - Final Status - Was patient successfully resuscitated ? No   Miscellaneous Information:  - Time of death:  34 AM  - Primary team notified?  Yes  - Family Notified? Yes        Rittberger, Bernita Raisin, DO   07/01/18, 9:41 AM

## 2018-06-14 NOTE — Progress Notes (Signed)
Progress Note    Priscilla Anderson  BWI:[id number] DOB: 1934-10-29  DOA: 05/23/2018 PCP: Biagio Borg, MD    Brief Narrative:   Chief complaint: F/U abdominal pain  Medical records reviewed and are as summarized below:  Priscilla Anderson is an 84 y.o. female with a PMH of chronic systolic CHF, nonischemic cardiomyopathy with an EF of 20% status post ICD, hypertension who was admitted 06/10/2018 for evaluation of abdominal pain associated with dehydration and AKI.  Assessment/Plan:   Principal Problem:   Acute renal failure superimposed on stage 3 chronic kidney disease ([place]) Admission creatinine was 3.95, baseline around 2.2.  Despite vigorous hydration, the patient's creatinine has actually worsened and is now 5.26.  Her potassium is borderline elevated.  AKI likely prerenal given ongoing problems with hypotension.  Lasix and spironolactone have been discontinued and she has received multiple fluid boluses overnight, a total of 1250 cc.  We will give in addition 1 L bolus now and if her blood pressure does not improve, may need transfer to the ICU for initiation of pressor support. Spoke with Dr. Halford Chessman who will see the patient in consultation. Check lactic acid and start empiric Cipro/Flagyl. Check acute abdominal series with chest.  Active Problems:   Ileus/constipation associated with abdominal pain Films personally reviewed.  CT negative for acute process.  Moderate volume of stool throughout the colon noted.  Subsequent KUB 05/27/2018 is consistent with ileus versus early/partial SBO. Stop Reglan. F/U AAS.    Type II diabetes with renal manifestations without long term use of insulin. Januvia and Metformin on hold.  Currently being managed with insulin sensitive SSI before meals and at bedtime.  CBGs 141-156.  Hemoglobin A1c 6.8%, indicating good outpatient control.    Macrocytic Anemia Mild, monitor. No indication for transfusion.    Essential hypertension Hypotensive. Stop Coreg  and Ivabradine. Spironolactone and Lasix held on admission.    Chronic systolic CHF (congestive heart failure) (HCC) / Biventricular ICD (implantable cardioverter-defibrillator) in place No overt signs of CHF, but has JVD on exam and may be problematic to continue to push IVF boluses.   Body mass index is 19.11 kg/m.   Family Communication/Anticipated D/C date and plan/Code Status   DVT prophylaxis: Heparin ordered. Code Status: Full Code.  Family Communication: Son updated by telephone. Disposition Plan: Tx to ICU   Medical Consultants:    PCCM   Anti-Infectives:    Cipro Jun 07, 2018--->  Flagyl Jun 07, 2018--->  Subjective:   Generalized feeling of unwellness, feeling dizzy. No flatus or BMs x 2 days. Bloated. Denies SOB and pain.  Objective:    Vitals:   07-Jun-2018 0459 2018/06/07 0501 2018/06/07 0503 2018/06/07 0658  BP: (!) 66/45 (!) 70/43 (!) 64/43 (!) 57/40  Pulse: 70 69 69 70  Resp:      Temp:      TempSrc:      SpO2:      Weight:      Height:        Intake/Output Summary (Last 24 hours) at Jun 07, 2018 0752 Last data filed at 2018-06-07 0645 Gross per 24 hour  Intake 1666.13 ml  Output 0 ml  Net 1666.13 ml   Filed Weights   06/07/18 0005  Weight: 52.1 kg (114 lb 13.8 oz)    Exam: General: No acute distress. Uncomfortable appearing. Cardiovascular: Heart sounds show a regular rate, and rhythm. No gallops or rubs. No murmurs. + JVD. Lungs: Clear to auscultation bilaterally with good fair movement. No rales,  rhonchi or wheezes. Abdomen: Distended but non-tender. No masses. No hepatosplenomegaly. Neurological: Alert and oriented 3. Moves all extremities 4 with equal strength. Cranial nerves II through XII grossly intact. Skin: Warm and dry. No rashes or lesions. Extremities: No clubbing or cyanosis. No edema. Pedal pulses 2+. Psychiatric: Mood and affect are normal. Insight and judgment are grossly normal.   Data Reviewed:   I have personally reviewed  following labs and imaging studies:  Labs: Labs show the following:   Basic Metabolic Panel: Recent Labs  Lab 05/30/18 2015 05/30/2018 0739 06/09/2018 1607 06/03/18 0609  NA 137 133* 133* 132*  K 5.1 5.2* 4.9 5.1  CL 100 88* 93* 94*  CO2 22 26 26 22   GLUCOSE 268* 245* 160* 190*  BUN 54* 82* 88* 100*  CREATININE 2.46* 3.95* 4.18* 5.26*  CALCIUM 9.4 10.9* 10.6* 10.3   GFR Estimated Creatinine Clearance: 6.8 mL/min (A) (by C-G formula based on SCr of 5.26 mg/dL (H)). Liver Function Tests: Recent Labs  Lab 05/30/18 2015 06/03/2018 0739 06/03/18 0609  AST 20 24 20   ALT 11 13 10   ALKPHOS 38 41 33*  BILITOT 0.7 0.9 1.0  PROT 6.8 7.0 6.7  ALBUMIN 4.0 4.2 3.6   Recent Labs  Lab 05/30/18 2015 05/19/2018 0739  LIPASE 67* 33   CBC: Recent Labs  Lab 05/30/18 2015 05/24/2018 0739 06/03/18 0609  WBC 7.9 11.4* 14.4*  HGB 10.9* 13.6 11.5*  HCT 34.2* 41.6 36.9  MCV 101.2* 98.1 101.1*  PLT 184 237 186   CBG: Recent Labs  Lab 06/03/18 1730 06/03/18 1817 06/03/18 2115 06/14/18 0651 06-14-18 0735  GLUCAP 63* 156* 141* 156* 141*   Hgb A1c: Recent Labs    05/31/2018 0739  HGBA1C 6.8*   Sepsis Labs: Recent Labs  Lab 05/30/18 2015 06/05/2018 0739 06/03/18 0609  WBC 7.9 11.4* 14.4*    Microbiology No results found for this or any previous visit (from the past 240 hour(s)).  Procedures and diagnostic studies:  Dg Abdomen 1 View  Result Date: 05/31/2018 CLINICAL DATA:  Abdominal pain and nausea with vomiting 3 days. Decreased appetite. EXAM: ABDOMEN - 1 VIEW COMPARISON:  CT abdomen/pelvis 05/31/2018 FINDINGS: Examination demonstrates several air and contrast filled mildly dilated small bowel loops measuring up to 4 cm in diameter over the mid and right abdomen. Air and stool present throughout the colon. No mass or mass effect. No free peritoneal air. Remainder the exam is unremarkable. IMPRESSION: Findings which may be due to small bowel ileus versus early/partial small  bowel obstruction. Electronically Signed   By: Marin Olp M.D.   On: 06/05/2018 09:04    Medications:   . carvedilol  12.5 mg Oral BID WC  . heparin  5,000 Units Subcutaneous Q8H  . insulin aspart  0-5 Units Subcutaneous QHS  . insulin aspart  0-9 Units Subcutaneous TID WC  . insulin aspart  6 Units Subcutaneous Once  . ivabradine  5 mg Oral BID WC  . metoCLOPramide (REGLAN) injection  5 mg Intravenous Q6H  . saccharomyces boulardii  250 mg Oral BID   Continuous Infusions: . sodium chloride 50 mL/hr at 06/03/18 1248  . ciprofloxacin    . famotidine (PEPCID) IV 20 mg (06/03/18 1250)  . metronidazole    . sodium chloride       LOS: 2 days    Critical CARE time: Time in 7:30 AM, time out 8:34 AM.  Time includes reviewing records, face-to-face interaction with the patient in bedside evaluation, coordinating  care with pulmonary critical care, ordering further diagnostic studies.  Margreta Journey Tyneshia Stivers  [facility] Pager [phone]. If unable to reach me by pager, please call my cell phone at [phone].  *Please refer to [email or web address], password [email or web address] to get updated schedule on who will round on this patient, as hospitalists switch teams weekly. If 7PM-7AM, please contact night-coverage at [email or web address], password [email or web address] for any overnight needs.  06/27/18, 7:52 AM

## 2018-06-14 NOTE — Progress Notes (Addendum)
Pt admitted to unit around 0928.  Initial pulse palpable.  Pupils blown on assessment.  Systolic into the 72T, levo increased to 60. Elink consulted.  Unable to palpate pulses - unable to doppler pulses. Code initiated at 0931. Per Dr Halford Chessman, [place].  Delorise Jackson, RN

## 2018-06-14 NOTE — Progress Notes (Signed)
Code blue called, arrived in patient's room . Began rescue breaths via ET tube and bag valve. CPR ended by Dr Halford Chessman.

## 2018-06-14 NOTE — Progress Notes (Signed)
Called by RN twice regarding hypotensive episodes throughout the night. Pt is otherwise asymptomatic. Low BP noted to start after giving valium around  2130. Pt given a total of 1L fluid bolus. Advised to discontinue use of valium at night. Continue to monitor  Lovey Newcomer, NP [facility] [phone]-7a [phone]

## 2018-06-14 NOTE — Progress Notes (Signed)
Came in the room to give patient her medication. Patient starts to complain of abdominal pain. State that she was wondering if it was the jello that she ate for breakfast.  Ask patient if she wanted to throw up. Gave her a emesis bag and the patient started throwing up brown/ tan colored fluid. I called the charge nurse she call rapid response and the patient loss consciousness and was pulseless. Charge nurse call a code.[place] K Kevante Lunt

## 2018-06-14 NOTE — Progress Notes (Addendum)
Late Entry:   Notified Rapid Response about pt's persistent low BP at approximately 0630 while waiting for on response from on call practitioner. Loralee Pacas, RN came to assess patient and paged on call practitioner. Order for 500cc bolus received and carried out. BP continued to run 50s to 60s with patients only complaint being weakness. Attending paged and order for 1L bolus received.

## 2018-06-14 NOTE — Consult Note (Signed)
PULMONARY / CRITICAL CARE MEDICINE   Name: Priscilla Anderson MRN: [id number] DOB: Jul 08, 1934    ADMISSION DATE:  05/24/2018 CONSULTATION DATE:  June 30, 2018  REFERRING MD:  Dr. Rockne Menghini  CHIEF COMPLAINT:  Abdominal pain  HISTORY OF PRESENT ILLNESS:   Pt not able to give history.    84 yo female presented with abdominal pain with distention, acute renal failure, and developed hypotension.  She developed vomiting with aspiration 7/22 leading to PEA cardiac arrest with ROSC in 15 minutes.   PAST MEDICAL HISTORY :  She  has a past medical history of Acute renal failure superimposed on stage 3 chronic kidney disease ([place]), Adenomatous colon polyp (2004), AICD (automatic cardioverter/defibrillator) present, Anemia, CAD (coronary artery disease), CHF (congestive heart failure) ([place]), Hyperlipidemia, Hypertension, Nonischemic cardiomyopathy ([place]) (09/22/2011), Osteopenia, Polyp of larynx, and Type II diabetes mellitus ([place]).  PAST SURGICAL HISTORY: She  has a past surgical history that includes Cardiac defibrillator placement (05/21/2002); Colonoscopy; Implantable cardioverter defibrillator generator change (09/01/2010); Biv icd genertaor change out (N/A, 07/29/2014); Vaginal hysterectomy; Breast cyst excision (Left, 1980); Cardiac catheterization (07/28/2005); Cardiac catheterization; and Microlaryngoscopy with co2 laser and excision of vocal cord lesion (08/2002).  Allergies  Allergen Reactions  . Lanoxin [Digoxin] Nausea Only    Nausea and wgt loss  . Amoxicillin Diarrhea    Severe diarrhea     No current facility-administered medications on file prior to encounter.    Current Outpatient Medications on File Prior to Encounter  Medication Sig  . Artificial Tear Solution (SOOTHE XP) SOLN Apply 1 drop to eye daily as needed (dry eyes).  . Ascorbic Acid (VITAMIN C) 500 MG tablet Take 500 mg by mouth daily.    Marland Kitchen aspirin 81 MG tablet Take 81 mg by mouth daily.    . calcium carbonate (TUMS - DOSED IN MG  ELEMENTAL CALCIUM) 500 MG chewable tablet Chew 2 tablets by mouth daily as needed for indigestion or heartburn.  . carvedilol (COREG) 12.5 MG tablet TAKE 1 TABLET TWICE A DAY  . diazepam (VALIUM) 5 MG tablet Take 1 tablet (5 mg total) by mouth at bedtime as needed for anxiety.  . dicyclomine (BENTYL) 10 MG capsule Take 1 capsule (10 mg total) by mouth 3 (three) times daily between meals as needed for up to 10 days for spasms.  . furosemide (LASIX) 40 MG tablet TAKE ONE AND ONE-HALF TABLETS DAILY (CHANGE IN DOSE)  . ibandronate (BONIVA) 150 MG tablet TAKE 1 TABLET ONCE A MONTH.  . ivabradine (CORLANOR) 5 MG TABS tablet Take 1 tablet (5 mg total) by mouth 2 (two) times daily with a meal.  . JANUVIA 100 MG tablet TAKE 1 TABLET DAILY  . losartan (COZAAR) 50 MG tablet TAKE 1 TABLET DAILY  . metFORMIN (GLUCOPHAGE-XR) 500 MG 24 hr tablet Take 1 tablet (500 mg total) by mouth daily.  . pantoprazole (PROTONIX) 40 MG tablet TAKE 1 TABLET TWICE A DAY  . polyethylene glycol powder (MIRALAX) powder Please start taking 1 capful 3 times a day. Slowly cut back as needed until you have normal bowel movements.  . saccharomyces boulardii (FLORASTOR) 250 MG capsule Take 1 capsule (250 mg total) by mouth 2 (two) times daily.  . Simethicone (GAS RELIEF) 180 MG CAPS Take 180 mg by mouth 2 (two) times daily. Takes 2 after meals and takes 2 at bed Ultra Strength  . simvastatin (ZOCOR) 20 MG tablet Take 1 tablet (20 mg total) by mouth at bedtime. NEED OV.  Marland Kitchen spironolactone (ALDACTONE) 25 MG  tablet TAKE 1 TABLET DAILY  . ACCU-CHEK AVIVA PLUS test strip USE TO TEST BLOOD SUGAR TWICE DAILY.  Marland Kitchen ACCU-CHEK SOFTCLIX LANCETS lancets USE TO TEST BLOOD SUGAR TWICE DAILY.    FAMILY HISTORY:  Her family history includes Arthritis in her father and mother; Diabetes in her other; Heart disease in her brother; Prostate cancer in her brother.  SOCIAL HISTORY: She  reports that she has never smoked. She has never used smokeless  tobacco. She reports that she has current or past drug history. Frequency: 2.00 times per week. She reports that she does not drink alcohol.  REVIEW OF SYSTEMS:   Unable to obtain  SUBJECTIVE:   VITAL SIGNS: BP (!) 57/40   Pulse 70   Temp (!) 97.5 F (36.4 C) (Oral)   Resp 20   Ht 5\' 5"  (1.651 m)   Wt 114 lb 13.8 oz (52.1 kg)   SpO2 93%   BMI 19.11 kg/m   HEMODYNAMICS:    VENTILATOR SETTINGS:    INTAKE / OUTPUT: I/O last 3 completed shifts: In: 2155.9 [P.O.:540; I.V.:1065.9; IV Piggyback:550] Out: 0   PHYSICAL EXAMINATION:  General - unresponsive Eyes - pupils dilated ENT - ETT in place Cardiac - regular rate/rhythm, 2/6 systolic murmur Chest - b/l rhonchi Abdomen - distended, increased tympany GU - no lesions noted Extremities - no cyanosis, clubbing, or edema Skin - no rashes Lymphatics - no lymphadenopathy Neuro - not following commands     LABS:  BMET Recent Labs  Lab 05/24/2018 0739 05/30/2018 1607 06/03/18 0609  NA 133* 133* 132*  K 5.2* 4.9 5.1  CL 88* 93* 94*  CO2 26 26 22   BUN 82* 88* 100*  CREATININE 3.95* 4.18* 5.26*  GLUCOSE 245* 160* 190*    Electrolytes Recent Labs  Lab 05/25/2018 0739 05/17/2018 1607 06/03/18 0609  CALCIUM 10.9* 10.6* 10.3    CBC Recent Labs  Lab 05/30/18 2015 05/31/2018 0739 06/03/18 0609  WBC 7.9 11.4* 14.4*  HGB 10.9* 13.6 11.5*  HCT 34.2* 41.6 36.9  PLT 184 237 186    Coag's No results for input(s): APTT, INR in the last 168 hours.  Sepsis Markers No results for input(s): LATICACIDVEN, PROCALCITON, O2SATVEN in the last 168 hours.  ABG No results for input(s): PHART, PCO2ART, PO2ART in the last 168 hours.  Liver Enzymes Recent Labs  Lab 05/30/18 2015 05/29/2018 0739 06/03/18 0609  AST 20 24 20   ALT 11 13 10   ALKPHOS 38 41 33*  BILITOT 0.7 0.9 1.0  ALBUMIN 4.0 4.2 3.6    Cardiac Enzymes No results for input(s): TROPONINI, PROBNP in the last 168 hours.  Glucose Recent Labs  Lab  06/03/18 1154 06/03/18 1730 06/03/18 1817 06/03/18 2115 06/20/2018 0651 2018-06-20 0735  GLUCAP 181* 63* 156* 141* 156* 141*    Imaging Dg Abd Portable 1v  Result Date: 06-20-2018 CLINICAL DATA:  Follow up small bowel dilatation EXAM: PORTABLE ABDOMEN - 1 VIEW COMPARISON:  05/14/2018 FINDINGS: Scattered large and small bowel gas is noted. Persistent dilated small bowel is noted in the mid abdomen. Previously seen contrast is less well appreciated on the current exam. No definitive free air is noted. No acute bony abnormality is seen. IMPRESSION: Persistent small bowel dilatation likely related to a partial small bowel obstruction or ileus. Previously seen contrast material has passed distally. Electronically Signed   By: Inez Catalina M.D.   On: June 20, 2018 07:54     STUDIES:  CT abd/pelvis 7/18 >> moderate stool in colon  CULTURES: Sputum 7/22 >> Blood 7/22 >>   ANTIBIOTICS: Cipro 7/22 >> Flagyl 7/22 >>  SIGNIFICANT EVENTS: 7/20 Admit 7/22 PEA cardiac arrest, to ICU  LINES/TUBES: ETT 7/22 >>   DISCUSSION: 84 yo female with PEA cardiac arrest with ROSC after 15 minutes in setting of ileus vs SBO with vomiting and aspiration, acute renal failure.  PMHx of systolic CHF with EF 16%  ASSESSMENT / PLAN:  Acute hypoxic respiratory failure from aspiration pneumonitis. - full vent support - f/u CXR, ABG  PEA cardiac arrest after respiratory failure. Chronic systolic CHF, mod MR, HTN. - monitor hemodynamics - continue IV fluids  Ileus versus SBO. - OG tube to suction  Acute renal failure from ATN. CKD 3 >> baseline creatinine 2.46 from 05/30/18. - insert foley - continue IV fluids - check FeNa, U/A  DM type II. - SSI  Acute metabolic encephalopathy with concern for anoxic encephalopathy after cardiac arrest. - monitor mental status  DVT prophylaxis - SQ heparin SUP - Pepcid Nutrition - NPO Goals of care - full code  CC time 39 minutes  D/w Dr. Teressa Lower, MD [place] 06/27/18, 9:04 AM

## 2018-06-14 DEATH — deceased

## 2018-07-15 NOTE — Discharge Summary (Signed)
Death Summary      Priscilla Anderson XAJ:[id number] DOB: [birth year] DOA: 06/13/2018  PCP: Biagio Borg, MD   Admit date: 06-13-2018 Date of Death: 06-17-18   Final Diagnoses:   Principal Problem:   Shock, likely from sepsis due to suspected perforated bowel in the setting of a SBO Active Problems:   Acute renal failure superimposed on stage 3 chronic kidney disease (HCC)   Anemia   Essential hypertension   Biventricular ICD (implantable cardioverter-defibrillator) in place   Type 2 diabetes mellitus with renal manifestations, controlled ([place])   Chronic systolic CHF (congestive heart failure) (HCC)   Abdominal pain  History of Present Illness:   Priscilla Anderson is an 84 y.o. female with a PMH of chronic systolic CHF, nonischemic cardiomyopathy with an EF of 20% status post ICD, hypertension who was admitted 13-Jun-2018 for evaluation of abdominal pain associated with dehydration and AKI.  Hospital Course:   Admission creatinine was 3.95, baseline around 2.2.  Initial films obtained and CT negative for acute process, though it did show moderate volume of stool. F/U KUB June 13, 2018 showed an ileus vs. Early/partial SBO. She became hypotensive overnight 06/03/18. Despite vigorous hydration, the patient's creatinine also worsened over night 06/03/18. She received an additional 1 L bolus and initiation of empiric Cipro/Flagyl for suspected bowel perforation.  Although lucid on exam, she had JVD and a very distended abdomen. SBO suspected to be the cause, and with her persistent hypotension, shock from possible bowel perforation was in the differential.  PCCM was consulted due to concerns for ongoing shock unresponsive to IVF and concerns for further deterioration in her condition. A short time after my assessment, and before orders could be placed for a NG tube, the patient vomited and aspirated.  A CODE blue called. CPR was performed and the patient was intubated and successfully resuscitated. She was  placed on Levophed. A short time after transfer to the ICU, however, she CODED again and succumbed to her illness.   Time of death: Around 10:30 a.m.  Signed:  Margreta Journey Rama  [facility] 06/15/2018, 11:07 AM

## 2018-08-14 ENCOUNTER — Ambulatory Visit: Payer: Medicare Other | Admitting: Internal Medicine

## 2019-05-14 ENCOUNTER — Ambulatory Visit: Payer: Medicare Other

## 2019-05-16 ENCOUNTER — Encounter (INDEPENDENT_AMBULATORY_CARE_PROVIDER_SITE_OTHER): Payer: Medicare Other | Admitting: Ophthalmology
